# Patient Record
Sex: Female | Born: 1965 | Race: White | Hispanic: No | Marital: Married | Smoking: Never smoker
Health system: Southern US, Community
[De-identification: ages and names within clinical notes are randomized; demographics above are authoritative.]

## PROBLEM LIST (undated history)

## (undated) DIAGNOSIS — K2950 Unspecified chronic gastritis without bleeding: Secondary | ICD-10-CM

## (undated) DIAGNOSIS — T8859XA Other complications of anesthesia, initial encounter: Secondary | ICD-10-CM

## (undated) DIAGNOSIS — K219 Gastro-esophageal reflux disease without esophagitis: Secondary | ICD-10-CM

## (undated) DIAGNOSIS — K2289 Other specified disease of esophagus: Secondary | ICD-10-CM

## (undated) DIAGNOSIS — T4145XA Adverse effect of unspecified anesthetic, initial encounter: Secondary | ICD-10-CM

## (undated) DIAGNOSIS — I10 Essential (primary) hypertension: Secondary | ICD-10-CM

## (undated) DIAGNOSIS — Z9889 Other specified postprocedural states: Secondary | ICD-10-CM

## (undated) DIAGNOSIS — F329 Major depressive disorder, single episode, unspecified: Secondary | ICD-10-CM

## (undated) DIAGNOSIS — F32A Depression, unspecified: Secondary | ICD-10-CM

## (undated) DIAGNOSIS — K228 Other specified diseases of esophagus: Secondary | ICD-10-CM

## (undated) DIAGNOSIS — R112 Nausea with vomiting, unspecified: Secondary | ICD-10-CM

## (undated) HISTORY — DX: Other specified disease of esophagus: K22.89

## (undated) HISTORY — PX: TUBAL LIGATION: SHX77

## (undated) HISTORY — DX: Major depressive disorder, single episode, unspecified: F32.9

## (undated) HISTORY — PX: BREAST MASS EXCISION: SHX1267

## (undated) HISTORY — PX: WISDOM TOOTH EXTRACTION: SHX21

## (undated) HISTORY — DX: Depression, unspecified: F32.A

## (undated) HISTORY — DX: Unspecified chronic gastritis without bleeding: K29.50

## (undated) HISTORY — DX: Other specified diseases of esophagus: K22.8

---

## 1997-12-28 ENCOUNTER — Emergency Department (HOSPITAL_COMMUNITY): Admission: EM | Admit: 1997-12-28 | Discharge: 1997-12-28 | Payer: Self-pay | Admitting: Emergency Medicine

## 1998-06-12 ENCOUNTER — Inpatient Hospital Stay (HOSPITAL_COMMUNITY): Admission: EM | Admit: 1998-06-12 | Discharge: 1998-06-14 | Payer: Self-pay

## 1999-01-18 ENCOUNTER — Inpatient Hospital Stay (HOSPITAL_COMMUNITY): Admission: EM | Admit: 1999-01-18 | Discharge: 1999-01-21 | Payer: Self-pay | Admitting: *Deleted

## 2002-06-04 ENCOUNTER — Ambulatory Visit (HOSPITAL_COMMUNITY): Admission: RE | Admit: 2002-06-04 | Discharge: 2002-06-04 | Payer: Self-pay | Admitting: Obstetrics and Gynecology

## 2002-06-04 ENCOUNTER — Encounter: Payer: Self-pay | Admitting: Obstetrics and Gynecology

## 2002-06-24 ENCOUNTER — Ambulatory Visit (HOSPITAL_COMMUNITY): Admission: RE | Admit: 2002-06-24 | Discharge: 2002-06-24 | Payer: Self-pay | Admitting: Internal Medicine

## 2004-07-21 ENCOUNTER — Ambulatory Visit (HOSPITAL_COMMUNITY): Admission: RE | Admit: 2004-07-21 | Discharge: 2004-07-21 | Payer: Self-pay | Admitting: Obstetrics and Gynecology

## 2005-08-31 HISTORY — PX: ESOPHAGEAL DILATION: SHX303

## 2005-09-13 ENCOUNTER — Ambulatory Visit: Payer: Self-pay | Admitting: Gastroenterology

## 2005-09-16 ENCOUNTER — Encounter (INDEPENDENT_AMBULATORY_CARE_PROVIDER_SITE_OTHER): Payer: Self-pay | Admitting: Specialist

## 2005-09-16 ENCOUNTER — Ambulatory Visit: Payer: Self-pay | Admitting: Gastroenterology

## 2005-09-16 ENCOUNTER — Ambulatory Visit (HOSPITAL_COMMUNITY): Admission: RE | Admit: 2005-09-16 | Discharge: 2005-09-16 | Payer: Self-pay | Admitting: Gastroenterology

## 2006-10-30 ENCOUNTER — Other Ambulatory Visit: Admission: RE | Admit: 2006-10-30 | Discharge: 2006-10-30 | Payer: Self-pay | Admitting: Obstetrics and Gynecology

## 2007-02-23 ENCOUNTER — Ambulatory Visit: Payer: Self-pay | Admitting: Gastroenterology

## 2007-03-05 ENCOUNTER — Ambulatory Visit (HOSPITAL_COMMUNITY): Admission: RE | Admit: 2007-03-05 | Discharge: 2007-03-05 | Payer: Self-pay | Admitting: Gastroenterology

## 2007-03-05 ENCOUNTER — Ambulatory Visit: Payer: Self-pay | Admitting: Gastroenterology

## 2007-03-05 ENCOUNTER — Encounter: Payer: Self-pay | Admitting: Gastroenterology

## 2007-03-19 ENCOUNTER — Ambulatory Visit: Payer: Self-pay | Admitting: Gastroenterology

## 2007-03-19 ENCOUNTER — Ambulatory Visit (HOSPITAL_COMMUNITY): Admission: RE | Admit: 2007-03-19 | Discharge: 2007-03-19 | Payer: Self-pay | Admitting: Gastroenterology

## 2007-05-22 ENCOUNTER — Ambulatory Visit (HOSPITAL_COMMUNITY): Admission: RE | Admit: 2007-05-22 | Discharge: 2007-05-22 | Payer: Self-pay | Admitting: Obstetrics and Gynecology

## 2007-09-11 DIAGNOSIS — Z87898 Personal history of other specified conditions: Secondary | ICD-10-CM | POA: Insufficient documentation

## 2007-09-11 DIAGNOSIS — K219 Gastro-esophageal reflux disease without esophagitis: Secondary | ICD-10-CM | POA: Insufficient documentation

## 2007-09-11 DIAGNOSIS — K2960 Other gastritis without bleeding: Secondary | ICD-10-CM | POA: Insufficient documentation

## 2007-09-11 DIAGNOSIS — K2980 Duodenitis without bleeding: Secondary | ICD-10-CM | POA: Insufficient documentation

## 2007-09-11 DIAGNOSIS — R1110 Vomiting, unspecified: Secondary | ICD-10-CM | POA: Insufficient documentation

## 2007-09-11 DIAGNOSIS — R1319 Other dysphagia: Secondary | ICD-10-CM | POA: Insufficient documentation

## 2007-12-06 ENCOUNTER — Other Ambulatory Visit: Admission: RE | Admit: 2007-12-06 | Discharge: 2007-12-06 | Payer: Self-pay | Admitting: Obstetrics & Gynecology

## 2009-01-05 ENCOUNTER — Ambulatory Visit (HOSPITAL_COMMUNITY): Admission: RE | Admit: 2009-01-05 | Discharge: 2009-01-05 | Payer: Self-pay | Admitting: Obstetrics & Gynecology

## 2009-01-08 ENCOUNTER — Other Ambulatory Visit: Admission: RE | Admit: 2009-01-08 | Discharge: 2009-01-08 | Payer: Self-pay | Admitting: Obstetrics & Gynecology

## 2009-07-24 ENCOUNTER — Encounter: Payer: Self-pay | Admitting: Gastroenterology

## 2009-07-28 ENCOUNTER — Ambulatory Visit: Payer: Self-pay | Admitting: Gastroenterology

## 2009-07-28 ENCOUNTER — Telehealth: Payer: Self-pay | Admitting: Gastroenterology

## 2009-07-28 ENCOUNTER — Ambulatory Visit (HOSPITAL_COMMUNITY): Admission: RE | Admit: 2009-07-28 | Discharge: 2009-07-28 | Payer: Self-pay | Admitting: Gastroenterology

## 2010-01-05 ENCOUNTER — Ambulatory Visit: Payer: Self-pay | Admitting: Cardiology

## 2010-01-05 DIAGNOSIS — R079 Chest pain, unspecified: Secondary | ICD-10-CM | POA: Insufficient documentation

## 2010-01-05 DIAGNOSIS — R9431 Abnormal electrocardiogram [ECG] [EKG]: Secondary | ICD-10-CM | POA: Insufficient documentation

## 2010-01-06 ENCOUNTER — Encounter: Payer: Self-pay | Admitting: Cardiology

## 2010-01-08 ENCOUNTER — Ambulatory Visit (HOSPITAL_COMMUNITY)
Admission: RE | Admit: 2010-01-08 | Discharge: 2010-01-08 | Payer: Self-pay | Source: Home / Self Care | Attending: Cardiology | Admitting: Cardiology

## 2010-01-08 ENCOUNTER — Encounter: Payer: Self-pay | Admitting: Cardiology

## 2010-01-11 ENCOUNTER — Ambulatory Visit (HOSPITAL_COMMUNITY)
Admission: RE | Admit: 2010-01-11 | Discharge: 2010-01-11 | Payer: Self-pay | Source: Home / Self Care | Attending: Obstetrics and Gynecology | Admitting: Obstetrics and Gynecology

## 2010-01-27 ENCOUNTER — Telehealth (INDEPENDENT_AMBULATORY_CARE_PROVIDER_SITE_OTHER): Payer: Self-pay

## 2010-02-12 ENCOUNTER — Other Ambulatory Visit
Admission: RE | Admit: 2010-02-12 | Discharge: 2010-02-12 | Payer: Self-pay | Source: Home / Self Care | Admitting: Obstetrics & Gynecology

## 2010-02-21 ENCOUNTER — Encounter: Payer: Self-pay | Admitting: Obstetrics and Gynecology

## 2010-03-02 NOTE — Assessment & Plan Note (Signed)
Summary: np6 chest discomfort and under brest pain   Visit Type:  Initial Consult Referring Provider:  Dr Benard Rink Primary Provider:  Dr.Golding  CC:  chest discomfort started yesterday.  History of Present Illness: [name] is referred in today for chest discomfort.  Yesterday morning, she awoke and started having a pinpoint sensation under her left breast. It went around to the middle of her back to her mid spine. She does not recall any injury or strain. She denies any fever, chills, arthralgias. It lasted all day and then start again this afternoon. She is having low-grade pain in the office.  She denies any rashes or evidence of shingles. She denies any difficulty taking a deep breath, pleuritic pain, hemoptysis.  She's had no further complaints except some dyspnea on exertion when she rarely exercises.  Her blood pressures been borderline and has been checked recently. He was initially in the office but rechecked it it was okay. She is very quick to say that she uses salt. She is overweight. She rarely exercises.  She has no other cardiac risk factors when carefully questioned.  She has a history of esophagitis and esophageal dilatation. She remains on Prevacid. The symptoms are not consistent with her reflux.  Current Medications (verified): 1)  Prevacid 30 Mg Cpdr (Lansoprazole) .Marland Kitchen.. 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  Alcohol Use - no Regular Exercise - no Drug Use - no  Risk Factors: Exercise: no (01/05/2010)  Risk Factors: Smoking Status: never  (01/05/2010)  Review of Systems       negative other than history of present illness  Vital Signs:  Patient profile:   45 year old female Height:      63 inches Weight:      194 pounds BMI:     34.49 O2 Sat:      97 % on Room air Pulse rate:   94 / minute BP sitting:   132 / 82  (right arm)  Vitals Entered By: Dreama Saa, CNA (January 05, 2010 2:35 PM)  O2 Flow:  Room air  Physical Exam  General:  obese.  no acute distressobese.   Head:  normocephalic and atraumatic Eyes:  PERRLA/EOM intact; conjunctiva and lids normal. Neck:  Neck supple, no JVD. No masses, thyromegaly or abnormal cervical nodes. Chest Wall:  no obvious tenderness, no rash Lungs:  Clear bilaterally to auscultation and percussion. Heart:  PMI nondisplaced, soft S1-S2 is split. No murmur no rub, no bruits Abdomen:  soft, good bowel sounds, no obvious tenderness Msk:  Back normal, normal gait. Muscle strength and tone normal. Pulses:  pulses normal in all 4 extremities Extremities:  No clubbing or cyanosis. Neurologic:  Alert and oriented x 3. Skin:  Intact without lesions or rashes. Psych:  Normal affect.   Problems:  Medical Problems Added: 1)  Dx of Overweight/obesity  (ICD-278.02) 2)  Dx of Electrocardiogram, Abnormal  (ICD-794.31) 3)  Dx of Chest Pain Unspecified  (ICD-786.50)  EKG  Procedure date:  01/05/2010  Findings:      normal sinus rhythm, RSR prime in V1 and V2, nonspecific ST segment changes  Impression & Recommendations:  Problem # 1:  ELECTROCARDIOGRAM, ABNORMAL (ICD-794.31) Assessment New Will obtained echocardiogram to assess LV size, function, and question of LVH with her borderline hypertension. If she has LVH we will prescribe antihypertensive therapy. Orders: 2-D Echocardiogram (2D Echo)  Problem # 2:  CHEST PAIN UNSPECIFIED (ICD-786.50) This is clearly noncardiac. Reassurance given. I'm concerned she may have potential shingles. Orders: 2-D Echocardiogram (2D  Echo)  Problem # 3:  ESOPHAGEAL STRICTURE (ICD-530.3) Assessment: Unchanged  Problem # 4:  ESOPHAGEAL REFLUX (ICD-530.81) Assessment: Unchanged  The following medications were removed from the medication list:    Kapidex 60 Mg Cpdr (Dexlansoprazole) .Marland Kitchen... Take 1 capsule by mouth once a day Her updated medication list for this problem includes:    Prevacid 30 Mg Cpdr (Lansoprazole) .Marland Kitchen... 1 by mouth every morning  Problem # 5:  OVERWEIGHT/OBESITY (ICD-278.02) encourage her to lose weight.  Other Orders: Durable Medical Equipment (DME)  Patient Instructions: 1)  Your physician recommends that you schedule a follow-up appointment in: as needed  2)  Your physician recommends that you continue on your current medications as directed. Please refer to the Current Medication list given to you today. 3)  Your physician encouraged you to lose weight for better health. 4)  Please walk at least 3 hours per week 5)  Your physician has requested that you have an echocardiogram.  Echocardiography is a painless test that uses sound waves to create images of your heart. It provides your doctor with information about the size and shape of your heart and how well your heart's chambers and valves are working.  This procedure takes approximately one hour. There are no restrictions for this procedure.

## 2010-03-02 NOTE — Letter (Signed)
Summary: egd order  egd order   Imported By: Hendricks Limes LPN 16/11/9602 54:09:81  _____________________________________________________________________  External Attachment:    Type:   Image     Comment:   External Document

## 2010-03-02 NOTE — Progress Notes (Signed)
Summary: Prevacid  Rx Prevacid. Pt will call if co-pay too expensive and will change to omeprazole. West Bali MD  July 28, 2009 10:33 AM      New/Updated Medications: PREVACID 30 MG CPDR (LANSOPRAZOLE) 1 by mouth every morning Prescriptions: PREVACID 30 MG CPDR (LANSOPRAZOLE) 1 by mouth every morning  #30 x 11   Entered and Authorized by:   West Bali MD   Signed by:   West Bali MD on 07/28/2009   Method used:   Electronically to        [facility]* (retail)       [address]       [address] Shipping/mailing       [place], [place]  [address]       Ph: [phone]       Fax: [phone]   RxID:   [id number]

## 2010-03-04 NOTE — Progress Notes (Signed)
Summary: diarrhea/vomiting  Phone Note Call from Patient Call back at Home Phone [phone]   Caller: Patient Summary of Call: pt called- having watery diarrhea and vomiting. requesting rx for phenergan called to [facility].  Initial call taken by: Hendricks Limes LPN,  January 27, 2010 1:30 PM     Appended Document: diarrhea/vomiting pt may have phenergan 25 mg tabs, take 1 by mouth q 6-8 hours as needed nausea. Disp# 30. INstruct start off with half a tablet to see how does. warn about risk of drowsiness.   Appended Document: diarrhea/vomiting pt aware, rx called to [facility]

## 2010-06-15 NOTE — Op Note (Signed)
[name], COLGATE               ACCOUNT NO.:  [id number]   MEDICAL RECORD NO.:  [id number]          PATIENT TYPE:  AMB   LOCATION:  DAY                           FACILITY:  [facility]   PHYSICIAN:  Kassie Mends, M.D.      DATE OF BIRTH:  1965/11/18   DATE OF PROCEDURE:  03/04/2007  DATE OF DISCHARGE:                               OPERATIVE REPORT   REFERRING PHYSICIAN:  Corrie Mckusick, M.D.   PROCEDURE:  Esophagogastroduodenoscopy with cold forceps biopsy and  Savary dilation to 14 mm.   INDICATION FOR EXAM:  Ms. [name] is a 45 year old female who was last  seen in 2007 for reflux disease.  She subsequently stopped her Prevacid.  She had antral biopsies which showed mild chronic gastritis.  She  presents with progressive solid dysphasia.  She was previously dilated  in August 2007.   FINDINGS:  1. Distal esophageal stricture which narrowed the esophageal lumen to      approximately 12 mm.  The distal esophagus was dilated successively      using the Savary dilators from 12 to 14 mm.  Otherwise no masses,      erosion or ulcerations seen in the distal esophagus.  2. Mild patchy erythema in the antrum without erosion or ulceration.      Biopsies obtained via cold forceps to evaluate for H. pylori      gastritis.  3. Moderate erythema with occasional erosions seen in the duodenal      bulb and the junction of D1 and D2.  Normal second portion of the      duodenum.   DIAGNOSES:  1. Dysphagia likely secondary to distal esophageal stricture.  2. Mild gastritis and duodenitis   RECOMMENDATIONS:  1. She should continue Prevacid indefinitely.  2. She should avoid aspirin and NSAIDs for 30 days.  No      anticoagulation for 7-14 days.  3. She should return in 1-2 weeks to have additional dilation.  4. May resume soft diet.  Meat should be chopped or ground meat and no      raw vegetables.  She was given a handout on gastritis.   MEDICATIONS:  1. Demerol 100 mg IV.  2. Versed 6 mg  IV.   PROCEDURE TECHNIQUE:  Physical exam was performed.  Informed consent was  obtained from the patient after explaining the benefits, risks and  alternatives to the procedure.  The patient was connected to the monitor  and placed in left lateral position.  Continuous oxygen was provided by  nasal cannula and IV medicine administered through an indwelling  cannula.  After administration of sedation, the patient's esophagus was  intubated and the scope was advanced under direct visualization to the  second portion of the duodenum.  The scope was removed slowly by  carefully examining the color, texture, anatomy and integrity of the  mucosa on the way out.  Cold forceps biopsies were obtained.  The Savary  guidewire was introduced and the scope was withdrawn.  Each dilator was  introduced over the guidewire.  The guidewire and the Savary  dilator  were removed after dilating the esophagus up to 14 mm.  The patient was  recovered in endoscopy and discharged home in satisfactory condition.      Kassie Mends, M.D.  Electronically Signed     SM/MEDQ  D:  03/05/2007  T:  03/05/2007  Job:  [id number]   cc:   Corrie Mckusick, M.D.  Fax: [phone]

## 2010-06-15 NOTE — Consult Note (Signed)
NAMECAELEY, DOHRMANN               ACCOUNT NO.:  [id number]   MEDICAL RECORD NO.:  [id number]          PATIENT TYPE:  AMB   LOCATION:  DAY                           FACILITY:  [facility]   PHYSICIAN:  Kassie Mends, M.D.      DATE OF BIRTH:  03/31/1965   DATE OF CONSULTATION:  02/23/2007  DATE OF DISCHARGE:                                 CONSULTATION   REFERRING PHYSICIAN:  Dr. Corrie Mckusick.   PROBLEM LIST:  1. Dysphagia.  2. History of mild chronic gastritis.  3. Esophageal dilation in August of 2007.  4. Duodenitis in August of 2007.  5. Tubal ligation   SUBJECTIVE:  Ms. Paulding is a 45 year old female who has had worsening  of her difficulty swallowing over the last 6 months.  Eating always  brings on the symptoms.  It does not matter whether it is scrambled eggs  or pork chops.  It has definitely been happening more frequently since  2005 or 2007.  Her worst episode was last Saturday.  She started eating  and had severe chest tightness.  She thought that she was going to have  to go to the emergency department.  It eventually resolved.  Her  symptoms sometimes get better been a hiccup, or a burp.  She has been  off the Prevacid for 1 year.  She says her heartburn comes and goes.  She denies any nausea, vomiting, or weight loss.   PAST MEDICAL HISTORY:  Headache.   PAST SURGICAL HISTORY:  1. Tubal ligation.  2. Breast biopsy.   SOCIAL HISTORY:  She is married and works in the clinic.  She does not  smoke.  She rarely consumes alcohol.   PHYSICAL EXAM:  VITAL SIGNS:  Weight 190 pounds (down 5 pounds since  August of 2007), height 5 feet 2 inches, temperature 97.7, blood  pressure 120/90, pulse 76.  GENERAL:  She is in no apparent distress, alert and orient x4.  LUNGS:  Clear to auscultation bilaterally.  CARDIOVASCULAR:  A regular rhythm, no murmur.  ABDOMEN:  Bowel sounds are present, soft, nontender, nondistended.   ASSESSMENT:  Ms. Stegner is a 45 year old female who  has dysphagia most  likely secondary to a distal esophageal stricture.  She could have  esophageal motility disorder secondary to uncontrolled reflux disease.  Thank you for allowing me to see Ms. Barcia in consultation.  My  recommendations follow.   RECOMMENDATIONS:  1. Begin Prevacid 30 mg daily.  2. Will schedule for an EGD with dilation.  3. She has a follow up appointment to see me in 2 months.      Kassie Mends, M.D.  Electronically Signed     SM/MEDQ  D:  02/23/2007  T:  02/23/2007  Job:  [id number]   cc:   Corrie Mckusick, M.D.  Fax: [phone]

## 2010-06-15 NOTE — Op Note (Signed)
[name], [name]               ACCOUNT NO.:  [id number]   MEDICAL RECORD NO.:  [id number]          PATIENT TYPE:  AMB   LOCATION:  DAY                           FACILITY:  [facility]   PHYSICIAN:  Kassie Mends, M.D.      DATE OF BIRTH:  [birth year]   DATE OF PROCEDURE:  03/19/2007  DATE OF DISCHARGE:                               OPERATIVE REPORT   REFERRING PHYSICIAN:  Corrie Mckusick, M.D.   PROCEDURE:  Esophagogastroduodenoscopy with Savary dilation to 16 mm.   INDICATION FOR EXAM:  Ms. Large is a 45 year old female who continues  to have solid dysphagia.  Her recent episode was within the last week.  She was dilated to 14 mm earlier in February.   FINDINGS:  1. Distal esophageal stricture.  This stricture was dilated      successively using the Savary dilator from 12.8 mm to 16 mm.      Otherwise no masses, inflammatory changes or erosions seen in the      esophagus.  2. Patchy erythema in the antrum.  3. Patchy erythema at the junction of D1 and D2.   DIAGNOSIS:  Dysphagia secondary to distal esophageal stricture requiring  esophageal dilation.   RECOMMENDATIONS:  1. Hold aspirin and anti-inflammatory drugs until I assess the need      for subsequent dilation.  2. She should continue her previous diet and consume ground or chopped      meats only, no raw vegetables.  3. She has a follow-up appointment see me in 3 weeks for dysphagia.  4. She should continue the Prevacid indefinitely.   MEDICATIONS:  1. Demerol 75 mg IV.  2. Versed 6 mg IV.   PROCEDURE TECHNIQUE:  Physical exam was formed.  Informed consent was  obtained from the patient explaining the benefits, risks and  alternatives to the procedure.  The patient was connected to the monitor  and placed in left lateral position.  Continuous oxygen was provided by  nasal cannula and IV medicine administered through an indwelling  cannula.  After administration of sedation, the patient's esophagus was  intubated and  the scope was advanced under direct visualization to the  second portion of the duodenum.  The scope was withdrawn.  The Savary  guidewire was introduced into the antrum.  The scope was removed slowly  by carefully examining the color,  texture, anatomy and integrity of the mucosa on the way out.  The GE  junction was noted at 30 cm from the teeth.  Each Savary dilator was  introduced over the guidewire.  The 16-mm guidewire and dilator were  removed.  The patient was recovered in the endoscopy and discharged home  in satisfactory condition.      Kassie Mends, M.D.  Electronically Signed     SM/MEDQ  D:  03/19/2007  T:  03/20/2007  Job:  [id number]   cc:   Corrie Mckusick, M.D.  Fax: [phone]

## 2010-06-18 NOTE — Op Note (Signed)
NAME:  [name], [name]                           ACCOUNT NO.:  [id number]   MEDICAL RECORD NO.:  [id number]                   PATIENT TYPE:  AMB   LOCATION:  DAY                                  FACILITY:  [facility]   PHYSICIAN:  Bernerd Limbo. Leona Carry, M.D.             DATE OF BIRTH:  1965-03-11   DATE OF PROCEDURE:  06/24/2002  DATE OF DISCHARGE:                                 OPERATIVE REPORT   PREOPERATIVE DIAGNOSES:  1. Fibroadenoma of the left breast.  2. Desire for sterilization.   POSTOPERATIVE DIAGNOSES:  1. Fibroadenoma of the left breast.  2. Desire for sterilization.   OPERATION PERFORMED:  Left partial mastectomy, laparoscopic bilateral tubal  ligation.   SURGEON:  Bernerd Limbo. Leona Carry, M.D.   COMPLICATIONS:  None.   OPERATIVE PROCEDURE:  Under adequate general anesthesia, the patient was  prepped and draped in the usual manner.   An elliptical incision was made in the upper outer quadrant of the left  breast.  This was carried through the subcutaneous tissues, and a large  lipoma that measured about 4 x 5 x 5 cm was removed.  Bleeders were  electrofulgurated.  The breast tissue was reapproximated with interrupted 2-  0 Vicryl.  The subcutaneous tissue was closed with continuous 3-0 Vicryl.  The skin was closed with subcuticular suture of 3-0 Vicryl.  Steri-Strips  and OpSite dressing applied.   An incision was then made below the umbilicus and a Veress needle inserted  into the peritoneal cavity.  The abdomen was insufflated up to 15 mmHg  pressure with 3-4 L of carbon dioxide.  The needle was removed, and the  trocar with the Visiport attached was inserted into the peritoneal cavity.  It should be mentioned that prior to surgery, a uterine manipulator had been  placed in the vagina.  A small incision was made in the right lower  quadrant, and through this, an 11-mm trocar was inserted.  A small incision  was made in the left lower quadrant, and a 5-mm trocar was  inserted.  The  patient was then placed in Trendelenburg.  The uterus was mobilized with the  uterine manipulator.  The left tube and ovary were identified.  The isthmus  was identified of the tube.  It was doubly clipped proximally and distally  and then transected utilizing the harmonic scalpel.  A similar procedure was  carried out on the right side.  At the completion of the procedure, all  bleeding was under control.  There was a small 2 to 3-mm cyst on the left  ovary.  The instruments were removed under direct vision.  The fascial  layers were closed with 2-0  Vicryl.  The skin was closed with skin clips.  Steri-Strips and OpSite  dressing applied.  The Foley catheter and uterine manipulator were removed.   The patient tolerated the procedure nicely and the left the room  in good  condition.                                               Bernerd Limbo. Leona Carry, M.D.    NMD/MEDQ  D:  06/24/2002  T:  06/24/2002  Job:  [id number]

## 2010-06-18 NOTE — Consult Note (Signed)
[name], [name]                  ACCOUNT NO.:  [id number]   MEDICAL RECORD NO.:  [id number]          PATIENT TYPE:  AMB   LOCATION:  DAY                           FACILITY:  [facility]   PHYSICIAN:  Kassie Mends, M.D.      DATE OF BIRTH:  [birth year]   DATE OF CONSULTATION:  09/13/2005  DATE OF DISCHARGE:                                   CONSULTATION   REASON FOR VISIT:  Dysphagia.   DATE OF VISIT:  September 13, 2005.   PRIMARY CARE PHYSICIAN:  Dr. Phillips Odor.   HISTORY OF PRESENT ILLNESS:  Ms. [name] is a 45 year old female who has been  having episodic dysphagia off and on or the last year.  It does not happen  more often than once every week or every other week.  When she eats solids,  she get a tightness in her chest, needs to stand up and throw her shoulders  back.  Eventually, it either relieves itself, or she has to vomit.  She  feels fine after the tightness resolves.  She admits to acid reflux at  nighttime less than once a week.  She denies any weight loss.  She has had a  30-pound weight gain.  At night she may have this nasty tasting stuff in  her mouth.  She treats her nighttime acid symptoms with Tums.  She has never  tried any other medicines.  She denies any blood in the vomit or stool.  She  does not have any liquid dysphagia.  She has not identified any specific  triggers for the tightness in her chest but does know which foods my trigger  an episode of reflux.  She denies any nausea or vomiting outside these  episodes. She denies any black tarry stools.   PAST MEDICAL HISTORY:  None.  She has had tumors removed from both breasts,  a tubal ligation, and wisdom teeth extracted.   She is not allergic to any medicines.  She currently takes Tylenol Cold and  Cough as needed.  She has no family history of colon polyps.  She has a  paternal uncle, aunt, and cousin who had colon cancer.  She denies any  history of esophageal motility disorders.  She has two children, ages 67  and  64.  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  She denies any  tobacco use and rarely drinks alcohol.   REVIEW OF SYSTEMS:  Per the HPI, otherwise all systems are negative.   PHYSICAL EXAMINATION:  VITAL SIGNS:  Weight 195 pounds, height 5 feet 3  inches, temperature 99.5, blood pressure 130/90, pulse 80.  GENERAL: She is in no apparent distress, alert and oriented x4.HEENT:  Atraumatic and normocephalic.  Pupils equal, round, and reactive to light.  Mouth: No oral lesions.  Posterior pharynx without erythema or exudate.NECK:  Full range of motion and no lymphadenopathy.LUNGS: Clear to auscultation  bilaterally.CARDIOVASCULAR:  Regular rate and rhythm.  No murmur.  Normal S1  and S2. ABDOMEN: Bowel sounds present.  Soft, nontender, nondistended.  No  rebound or guarding.  Obese, no hepatosplenomegaly.EXTREMITIES:  Without  cyanosis, clubbing, or edema.  NEUROLOGIC:  No focal deficits.   Ms. [name] is a 45 year old female with solid dysphagia.  She likely has a  peptic stricture from uncontrolled reflux disease.  The differential  diagnoses include eosinophilic esophagitis or esophageal motility disorder.   Ms. [name] should have an upper endoscopy as soon as possible.  She is  instructed to strictly adhere to a soft and mechanical soft diet.  She may  follow up as needed after her procedure is performed.      Kassie Mends, M.D.  Electronically Signed     SM/MEDQ  D:  09/13/2005  T:  09/13/2005  Job:  [id number]   cc:   Corrie Mckusick, M.D.  Fax: [phone]

## 2010-06-18 NOTE — Op Note (Signed)
[name], [name]                  ACCOUNT NO.:  [id number]   MEDICAL RECORD NO.:  [id number]          PATIENT TYPE:  AMB   LOCATION:  DAY                           FACILITY:  [facility]   PHYSICIAN:  Kassie Mends, M.D.      DATE OF BIRTH:  1965/02/16   DATE OF PROCEDURE:  09/16/2005  DATE OF DISCHARGE:                                 OPERATIVE REPORT   PROCEDURE:  Esophagogastroduodenoscopy with Savary dilation and cold forceps  biopsy.   INDICATION FOR EXAMINATION:  Ms. [name] is a 45 year old female with solid  dysphagia and complaining of heartburn.   MEDICATIONS:  1. Demerol 100 mg IV.  2. Versed 4 mg IV.   FINDINGS:  1. Distal esophageal stricture likely secondary to uncontrolled reflux      disease.  This is likely source for her solid dysphagia.  2. Mild antral gastritis.  Biopsies obtained for H pylori.  3. Mild duodenitis.  4. Otherwise esophagus without evidence of inflammation, mass or Barrett's      esophagus.   RECOMMENDATIONS:  1. Prevacid twice daily for 8 weeks then daily.  She will follow-up in 3      months.  2. No aspirin, NSAIDs or anticoagulation for 7 days.   PROCEDURE TECHNIQUE:  A physical exam was performed and informed consent was  obtained from the patient after explaining the benefits, risks and  alternatives to the procedure.  The patient was connected to the monitor and  placed in the left lateral position.  Continuous oxygen was provided by  nasal cannula and IV medicine administered via an indwelling cannula.  After  sedation, the patient's esophagus was intubated and the scope was advanced  under  direct visualization to the second portion of the duodenum.  The scope was  slowly withdrawn while closely examining the mucosal anatomy, color, texture  and integrity on the way out.  The patient was recovered in the endoscopy  suite and discharged home in satisfactory condition.      Kassie Mends, M.D.  Electronically Signed     SM/MEDQ  D:   09/16/2005  T:  09/16/2005  Job:  [id number]   cc:   Corrie Mckusick, M.D.  Fax: [phone]

## 2010-06-18 NOTE — H&P (Signed)
NAME:  [name], Schuchard                           ACCOUNT NO.:  [id number]   MEDICAL RECORD NO.:  [id number]                   PATIENT TYPE:  AMB   LOCATION:  DAY                                  FACILITY:  [facility]   PHYSICIAN:  Bernerd Limbo. Leona Carry, M.D.             DATE OF BIRTH:  09-14-1965   DATE OF ADMISSION:  DATE OF DISCHARGE:                                HISTORY & PHYSICAL   This 45 year old female was admitted to this hospital for right partial  mastectomy.   HISTORY OF PRESENT ILLNESS:  The patient recently had a mammogram that  revealed bilateral fibroadenomas of both wrists, most marked on the left.  The fibroadenoma on the left measured 3 to 4 cm in diameter. There has been  no evidence of any nipple discharge. She has had some soreness related to  this.   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 This was also for fibroadenoma. She is a gravida 2,  para 2, AB 0. She has no history of any food or drug allergy.   As mentioned, she did have some soreness of both breasts before her periods,  most marked on the left. Periods have been irregular apparently had been  told that she probably had some polycystic ovaries. She is not taking any  medications.   PHYSICAL EXAMINATION:  GENERAL:  Reveals a healthy, well-developed, well-  nourished, 45 year old white female in no acute distress.  VITAL SIGNS:  Her blood pressure is 130/74, pulse 80, respirations 20.  HEAD, EYES, EARS, NOSE AND THROAT:  Were normal. No jaundice.  NECK:  Supple. Thyroid not enlarged.  CARDIOVASCULAR:  Regular sinus rhythm with no rubs or  murmurs.  RESPIRATORY:  Chest is clear to percussion and auscultation.  BREASTS:  Moderate size. In the left upper quadrant of the left breast,  there is a large 3 to 4 cm mass that is slightly tender to touch, is smooth  and firm. Left axilla is normal. Right breast essentially normal. I really  do not feel any  discrete masses. Right axilla is normal. The right breast  has a well-healed upper scar around the areolar of the right breast.  ABDOMEN:  Soft. No areas of muscle guarding or tenderness. No visceromegaly.  No operative scars.  LIMBS AND BACK:  Negative.  PELVIC EXAM:  Deferred.   ADMISSION DIAGNOSIS:  Fibroadenoma of the left breast.   The patient also has requested a tubal ligation. She is a gravida 2, para 2,  AB 0. I discussed this with her also. The surgery, risks and complications,  and possible consequences have been discussed with her. She agrees to the  surgery. She has been scheduled for a left breast biopsy and laparoscopic  tubal ligation for the 24th of May.  Bernerd Limbo. Leona Carry, M.D.   NMD/MEDQ  D:  06/21/2002  T:  06/21/2002  Job:  [id number]

## 2010-12-15 ENCOUNTER — Telehealth: Payer: Self-pay | Admitting: Gastroenterology

## 2010-12-15 MED ORDER — LEVOFLOXACIN 500 MG PO TABS: 500.0000 mg | ORAL_TABLET | Freq: Every day | ORAL | Status: AC

## 2010-12-15 MED ORDER — BENZONATATE 100 MG PO CAPS: ORAL_CAPSULE | ORAL | Status: DC

## 2010-12-15 NOTE — Telephone Encounter (Signed)
Patient with chronic cough, 6 wks. Lisinopril switched to metoprolol. No improvement on Zpak, medrol shots. Persistent nonproductive cough. Also on zoloft, prevacid.  Try Tessalon, levaquin.

## 2010-12-16 NOTE — Telephone Encounter (Signed)
Lungs CTAB.

## 2010-12-28 ENCOUNTER — Other Ambulatory Visit: Payer: Self-pay | Admitting: Obstetrics & Gynecology

## 2010-12-28 DIAGNOSIS — Z139 Encounter for screening, unspecified: Secondary | ICD-10-CM

## 2011-01-14 ENCOUNTER — Ambulatory Visit (HOSPITAL_COMMUNITY)
Admission: RE | Admit: 2011-01-14 | Discharge: 2011-01-14 | Disposition: A | Discharge status: Home / Self Care | Payer: 59 | Source: Ambulatory Visit | Attending: Obstetrics & Gynecology | Admitting: Obstetrics & Gynecology

## 2011-01-14 ENCOUNTER — Ambulatory Visit (HOSPITAL_COMMUNITY): Payer: 59

## 2011-01-14 DIAGNOSIS — Z139 Encounter for screening, unspecified: Secondary | ICD-10-CM

## 2011-01-14 DIAGNOSIS — Z1231 Encounter for screening mammogram for malignant neoplasm of breast: Secondary | ICD-10-CM | POA: Insufficient documentation

## 2011-01-20 ENCOUNTER — Other Ambulatory Visit: Payer: Self-pay | Admitting: Obstetrics & Gynecology

## 2011-01-20 DIAGNOSIS — R928 Other abnormal and inconclusive findings on diagnostic imaging of breast: Secondary | ICD-10-CM

## 2011-01-27 ENCOUNTER — Encounter: Payer: Self-pay | Admitting: Cardiology

## 2011-02-08 ENCOUNTER — Other Ambulatory Visit: Payer: Self-pay

## 2011-02-08 MED ORDER — LANSOPRAZOLE 30 MG PO CPDR: 30.0000 mg | DELAYED_RELEASE_CAPSULE | Freq: Every day | ORAL | Status: DC

## 2011-02-15 ENCOUNTER — Other Ambulatory Visit: Payer: Self-pay | Admitting: Obstetrics & Gynecology

## 2011-02-15 ENCOUNTER — Other Ambulatory Visit (HOSPITAL_COMMUNITY)
Admission: RE | Admit: 2011-02-15 | Discharge: 2011-02-15 | Disposition: A | Discharge status: Home / Self Care | Payer: 59 | Source: Ambulatory Visit | Attending: Obstetrics & Gynecology | Admitting: Obstetrics & Gynecology

## 2011-02-15 DIAGNOSIS — Z01419 Encounter for gynecological examination (general) (routine) without abnormal findings: Secondary | ICD-10-CM | POA: Insufficient documentation

## 2011-02-16 ENCOUNTER — Ambulatory Visit (HOSPITAL_COMMUNITY)
Admission: RE | Admit: 2011-02-16 | Discharge: 2011-02-16 | Disposition: A | Discharge status: Home / Self Care | Payer: 59 | Source: Ambulatory Visit | Attending: Obstetrics & Gynecology | Admitting: Obstetrics & Gynecology

## 2011-02-16 ENCOUNTER — Other Ambulatory Visit: Payer: Self-pay | Admitting: Obstetrics & Gynecology

## 2011-02-16 DIAGNOSIS — D249 Benign neoplasm of unspecified breast: Secondary | ICD-10-CM | POA: Insufficient documentation

## 2011-02-16 DIAGNOSIS — N63 Unspecified lump in unspecified breast: Secondary | ICD-10-CM | POA: Insufficient documentation

## 2011-02-16 DIAGNOSIS — R928 Other abnormal and inconclusive findings on diagnostic imaging of breast: Secondary | ICD-10-CM

## 2011-12-15 ENCOUNTER — Other Ambulatory Visit: Payer: Self-pay | Admitting: Obstetrics & Gynecology

## 2011-12-16 ENCOUNTER — Other Ambulatory Visit: Payer: Self-pay | Admitting: Obstetrics & Gynecology

## 2011-12-16 DIAGNOSIS — Z139 Encounter for screening, unspecified: Secondary | ICD-10-CM

## 2012-01-16 ENCOUNTER — Ambulatory Visit (HOSPITAL_COMMUNITY): Payer: 59

## 2012-01-23 ENCOUNTER — Ambulatory Visit (HOSPITAL_COMMUNITY): Payer: 59

## 2012-01-30 ENCOUNTER — Ambulatory Visit (HOSPITAL_COMMUNITY)
Admission: RE | Admit: 2012-01-30 | Discharge: 2012-01-30 | Disposition: A | Discharge status: Home / Self Care | Payer: 59 | Source: Ambulatory Visit | Attending: Obstetrics & Gynecology | Admitting: Obstetrics & Gynecology

## 2012-01-30 DIAGNOSIS — R922 Inconclusive mammogram: Secondary | ICD-10-CM | POA: Insufficient documentation

## 2012-01-30 DIAGNOSIS — Z139 Encounter for screening, unspecified: Secondary | ICD-10-CM

## 2012-02-13 ENCOUNTER — Other Ambulatory Visit: Payer: Self-pay | Admitting: Obstetrics & Gynecology

## 2012-02-13 DIAGNOSIS — R928 Other abnormal and inconclusive findings on diagnostic imaging of breast: Secondary | ICD-10-CM

## 2012-02-29 ENCOUNTER — Ambulatory Visit (HOSPITAL_COMMUNITY)
Admission: RE | Admit: 2012-02-29 | Discharge: 2012-02-29 | Disposition: A | Discharge status: Home / Self Care | Payer: 59 | Source: Ambulatory Visit | Attending: Obstetrics & Gynecology | Admitting: Obstetrics & Gynecology

## 2012-02-29 ENCOUNTER — Other Ambulatory Visit (HOSPITAL_COMMUNITY): Payer: Self-pay | Admitting: Obstetrics & Gynecology

## 2012-02-29 DIAGNOSIS — D249 Benign neoplasm of unspecified breast: Secondary | ICD-10-CM | POA: Insufficient documentation

## 2012-02-29 DIAGNOSIS — R928 Other abnormal and inconclusive findings on diagnostic imaging of breast: Secondary | ICD-10-CM

## 2012-06-01 ENCOUNTER — Telehealth: Payer: Self-pay | Admitting: Gastroenterology

## 2012-06-01 MED ORDER — BENZONATATE 100 MG PO CAPS
ORAL_CAPSULE | ORAL | Status: DC
Start: 2012-06-01 — End: 2012-06-01

## 2012-06-01 MED ORDER — BENZONATATE 100 MG PO CAPS: ORAL_CAPSULE | ORAL | Status: DC

## 2012-06-01 NOTE — Telephone Encounter (Signed)
Patient with complaints of cough related to allergies. Watery nasal discharge. Coughing up clear mucous. No SOB. Without PCP currently. Taking metoprolol, zoloft, prevacid, allergy pills, benedryl. Lungs CTA. Heart: RRR, no murmurs, rubs, gallops. Afebrile.  Trial of Tessalon 100mg , take 1-2 po tid prn cough. RX sent to [facility].  Establish care with PCP.

## 2012-07-10 ENCOUNTER — Ambulatory Visit: Payer: Self-pay | Admitting: Adult Health

## 2012-07-16 ENCOUNTER — Ambulatory Visit (INDEPENDENT_AMBULATORY_CARE_PROVIDER_SITE_OTHER): Payer: 59 | Admitting: Family Medicine

## 2012-07-16 ENCOUNTER — Encounter: Payer: Self-pay | Admitting: Family Medicine

## 2012-07-16 VITALS — BP 130/70 | HR 100 | Temp 98.5°F | Resp 20 | Ht 61.5 in | Wt 197.0 lb

## 2012-07-16 DIAGNOSIS — Z13228 Encounter for screening for other metabolic disorders: Secondary | ICD-10-CM

## 2012-07-16 DIAGNOSIS — N3946 Mixed incontinence: Secondary | ICD-10-CM

## 2012-07-16 DIAGNOSIS — R5383 Other fatigue: Secondary | ICD-10-CM | POA: Insufficient documentation

## 2012-07-16 DIAGNOSIS — Z1321 Encounter for screening for nutritional disorder: Secondary | ICD-10-CM

## 2012-07-16 DIAGNOSIS — Z130 Encounter for screening for diseases of the blood and blood-forming organs and certain disorders involving the immune mechanism: Secondary | ICD-10-CM

## 2012-07-16 DIAGNOSIS — R32 Unspecified urinary incontinence: Secondary | ICD-10-CM

## 2012-07-16 DIAGNOSIS — I10 Essential (primary) hypertension: Secondary | ICD-10-CM

## 2012-07-16 DIAGNOSIS — R5381 Other malaise: Secondary | ICD-10-CM

## 2012-07-16 DIAGNOSIS — F338 Other recurrent depressive disorders: Secondary | ICD-10-CM | POA: Insufficient documentation

## 2012-07-16 DIAGNOSIS — E669 Obesity, unspecified: Secondary | ICD-10-CM | POA: Insufficient documentation

## 2012-07-16 DIAGNOSIS — N898 Other specified noninflammatory disorders of vagina: Secondary | ICD-10-CM

## 2012-07-16 DIAGNOSIS — F39 Unspecified mood [affective] disorder: Secondary | ICD-10-CM

## 2012-07-16 DIAGNOSIS — G4700 Insomnia, unspecified: Secondary | ICD-10-CM

## 2012-07-16 DIAGNOSIS — K219 Gastro-esophageal reflux disease without esophagitis: Secondary | ICD-10-CM

## 2012-07-16 LAB — URINALYSIS, MICROSCOPIC ONLY: Casts: NONE SEEN

## 2012-07-16 LAB — URINALYSIS, ROUTINE W REFLEX MICROSCOPIC
Bilirubin Urine: NEGATIVE
Ketones, ur: NEGATIVE mg/dL
Nitrite: NEGATIVE
Specific Gravity, Urine: >1.03 — ABNORMAL HIGH (ref 1.005–1.030)
pH: 5.5 (ref 5.0–8.0)

## 2012-07-16 NOTE — Assessment & Plan Note (Signed)
Pt not truly symptomatic, await culture

## 2012-07-16 NOTE — Assessment & Plan Note (Signed)
Discussed trial of melatonin, good sleep hygiene

## 2012-07-16 NOTE — Assessment & Plan Note (Signed)
Obtain previous records, very low dose of zoloft? Uses during winter months

## 2012-07-16 NOTE — Assessment & Plan Note (Signed)
On PPI, has seen GI in past

## 2012-07-16 NOTE — Assessment & Plan Note (Signed)
Well controlled no meds

## 2012-07-16 NOTE — Assessment & Plan Note (Signed)
encouraged increase in activity and weight loss

## 2012-07-16 NOTE — Progress Notes (Signed)
  Subjective:    Patient ID: [name], female    DOB: [birth year], 47 y.o.   MRN: [id number]  HPI  Pt here to establish care, last PCP [facility]. GI- Dr. Darrick Penna, GYN- [facility] Medications and history reviewed Fatigue for past 6 months, does not exercise on regular basis, stopped her BP meds. Takes nap during the day and still tired in evening, has difficulty sleeping at night for many years. Incontinence- worsening incontinence, leaks urine with cough or sneeze, past few weeks, has a lot of urge to urinate, denies dysuria, abd pain, pelvic pain. Has GYN appt coming up Seasonal depression- uses zoloft in winter only for past 2 years  Review of Systems - per above   GEN- +fatigue, denies fever, weight loss,weakness, recent illness HEENT- denies eye drainage, change in vision, nasal discharge, CVS- denies chest pain, palpitations RESP- denies SOB, cough, wheeze ABD- denies N/V, change in stools, abd pain GU- denies dysuria, hematuria, dribbling, incontinence MSK- denies joint pain, muscle aches, injury Neuro- denies headache, dizziness, syncope, seizure activity       Objective:   Physical Exam GEN- NAD, alert and oriented x3 HEENT- PERRL, EOMI, non injected sclera, pink conjunctiva, MMM, oropharynx clear Neck- Supple, no thyromegaly CVS- RRR, no murmur RESP-CTAB ABD-NABS,soft,NT,ND, no CVA tenderness EXT- No edema Pulses- Radial, DP- 2+ Psych- normal affect and mood       Assessment & Plan:

## 2012-07-16 NOTE — Patient Instructions (Signed)
Get the labs done fasting Start multivitamin once a day, make sure there is B12, if not take B12 daily Perform the kegal exercises Follow-up with GYN F/U 6 months or as needed

## 2012-07-16 NOTE — Assessment & Plan Note (Signed)
likley multifactorial, check fasting labs, add vitamin, increase exercise

## 2012-07-16 NOTE — Assessment & Plan Note (Signed)
UC pending, she will have pelvic exam with GYN Discussed possible use of meds, she wants to hold at this time Kegal exercises

## 2012-07-18 LAB — URINE CULTURE: Colony Count: 75000

## 2012-07-21 LAB — CBC WITH DIFFERENTIAL/PLATELET
Basophils Relative: 1 % (ref 0–1)
HCT: 37.6 % (ref 36.0–46.0)
Hemoglobin: 12.9 g/dL (ref 12.0–15.0)
Lymphocytes Relative: 33 % (ref 12–46)
Lymphs Abs: 2.4 10*3/uL (ref 0.7–4.0)
MCHC: 34.3 g/dL (ref 30.0–36.0)
Monocytes Absolute: 0.5 10*3/uL (ref 0.1–1.0)
Monocytes Relative: 7 % (ref 3–12)
Neutro Abs: 4.1 10*3/uL (ref 1.7–7.7)
Neutrophils Relative %: 57 % (ref 43–77)
RBC: 4.92 MIL/uL (ref 3.87–5.11)
WBC: 7.2 10*3/uL (ref 4.0–10.5)

## 2012-07-21 LAB — COMPREHENSIVE METABOLIC PANEL
AST: 15 U/L (ref 0–37)
Albumin: 4 g/dL (ref 3.5–5.2)
BUN: 11 mg/dL (ref 6–23)
Calcium: 8.8 mg/dL (ref 8.4–10.5)
Chloride: 110 mEq/L (ref 96–112)
Creat: 0.73 mg/dL (ref 0.50–1.10)
Glucose, Bld: 108 mg/dL — ABNORMAL HIGH (ref 70–99)
Potassium: 3.8 mEq/L (ref 3.5–5.3)

## 2012-07-21 LAB — LIPID PANEL
Cholesterol: 140 mg/dL (ref 0–200)
LDL Cholesterol: 89 mg/dL (ref 0–99)
Triglycerides: 75 mg/dL (ref <150)

## 2012-07-21 LAB — TSH: TSH: 1.826 u[IU]/mL (ref 0.350–4.500)

## 2012-07-29 LAB — VITAMIN D 1,25 DIHYDROXY
Vitamin D2 1, 25 (OH)2: <8 pg/mL
Vitamin D3 1, 25 (OH)2: 49 pg/mL

## 2012-08-16 ENCOUNTER — Ambulatory Visit (INDEPENDENT_AMBULATORY_CARE_PROVIDER_SITE_OTHER): Payer: 59 | Admitting: Obstetrics & Gynecology

## 2012-08-16 ENCOUNTER — Encounter: Payer: Self-pay | Admitting: Obstetrics & Gynecology

## 2012-08-16 ENCOUNTER — Other Ambulatory Visit (HOSPITAL_COMMUNITY)
Admission: RE | Admit: 2012-08-16 | Discharge: 2012-08-16 | Disposition: A | Discharge status: Home / Self Care | Payer: 59 | Source: Ambulatory Visit | Attending: Obstetrics & Gynecology | Admitting: Obstetrics & Gynecology

## 2012-08-16 VITALS — BP 132/88 | Ht 61.5 in | Wt 197.0 lb

## 2012-08-16 DIAGNOSIS — Z1151 Encounter for screening for human papillomavirus (HPV): Secondary | ICD-10-CM | POA: Insufficient documentation

## 2012-08-16 DIAGNOSIS — Z01419 Encounter for gynecological examination (general) (routine) without abnormal findings: Secondary | ICD-10-CM

## 2012-08-16 NOTE — Progress Notes (Signed)
Patient ID: [name], female   DOB: 02-01-1965, 47 y.o.   MRN: [id number] Subjective:     [name] is a 47 y.o. female here for a routine exam.  Patient's last menstrual period was 07/30/2012. No obstetric history on file. Current complaints: none.  Personal health questionnaire reviewed: yes.  Regular menses, also decreased libido, no pain extensive questioning unsure if has had an orgasm, scream cream given Gynecologic History Patient's last menstrual period was 07/30/2012. Contraception: tubal ligation Last Pap: 2013. Results were: normal Last mammogram: 2014. Results were: biopsy negative  Obstetric History OB History   Grav Para Term Preterm Abortions TAB SAB Ect Mult Living                   The following portions of the patient's history were reviewed and updated as appropriate: allergies, current medications, past family history, past medical history, past social history, past surgical history and problem list.  Review of Systems  Review of Systems  Constitutional: Negative for fever, chills, weight loss, malaise/fatigue and diaphoresis.  HENT: Negative for hearing loss, ear pain, nosebleeds, congestion, sore throat, neck pain, tinnitus and ear discharge.   Eyes: Negative for blurred vision, double vision, photophobia, pain, discharge and redness.  Respiratory: Negative for cough, hemoptysis, sputum production, shortness of breath, wheezing and stridor.   Cardiovascular: Negative for chest pain, palpitations, orthopnea, claudication, leg swelling and PND.  Gastrointestinal: negative for abdominal pain. Negative for heartburn, nausea, vomiting, diarrhea, constipation, blood in stool and melena.  Genitourinary: Negative for dysuria, urgency, frequency, hematuria and flank pain.  Musculoskeletal: Negative for myalgias, back pain, joint pain and falls.  Skin: Negative for itching and rash.  Neurological: Negative for dizziness, tingling, tremors, sensory change,  speech change, focal weakness, seizures, loss of consciousness, weakness and headaches.  Endo/Heme/Allergies: Negative for environmental allergies and polydipsia. Does not bruise/bleed easily.  Psychiatric/Behavioral: Negative for depression, suicidal ideas, hallucinations, memory loss and substance abuse. The patient is not nervous/anxious and does not have insomnia.        Objective:    Physical Exam  Vitals reviewed. Constitutional: She is oriented to person, place, and time. She appears well-developed and well-nourished.  HENT:  Head: Normocephalic and atraumatic.        Right Ear: External ear normal.  Left Ear: External ear normal.  Nose: Nose normal.  Mouth/Throat: Oropharynx is clear and moist.  Eyes: Conjunctivae and EOM are normal. Pupils are equal, round, and reactive to light. Right eye exhibits no discharge. Left eye exhibits no discharge. No scleral icterus.  Neck: Normal range of motion. Neck supple. No tracheal deviation present. No thyromegaly present.  Cardiovascular: Normal rate, regular rhythm, normal heart sounds and intact distal pulses.  Exam reveals no gallop and no friction rub.   No murmur heard. Respiratory: Effort normal and breath sounds normal. No respiratory distress. She has no wheezes. She has no rales. She exhibits no tenderness.  GI: Soft. Bowel sounds are normal. She exhibits no distension and no mass. There is no tenderness. There is no rebound and no guarding.  Genitourinary:       Vulva is normal without lesions Vagina is pink moist without discharge Cervix normal in appearance and pap is done Uterus is normal size shape and contour Adnexa is negative with normal sized ovaries   Musculoskeletal: Normal range of motion. She exhibits no edema and no tenderness.  Neurological: She is alert and oriented to person, place, and time. She has normal reflexes.  She displays normal reflexes. No cranial nerve deficit. She exhibits normal muscle tone.  Coordination normal.  Skin: Skin is warm and dry. No rash noted. No erythema. No pallor.  Psychiatric: She has a normal mood and affect. Her behavior is normal. Judgment and thought content normal.       Assessment:    Healthy female exam.    Plan:    Follow up in: 1 year.

## 2012-09-11 ENCOUNTER — Telehealth: Payer: Self-pay

## 2012-09-11 ENCOUNTER — Encounter: Payer: Self-pay | Admitting: Family Medicine

## 2012-09-11 MED ORDER — LANSOPRAZOLE 30 MG PO CPDR: 30.0000 mg | DELAYED_RELEASE_CAPSULE | Freq: Every day | ORAL | Status: DC

## 2012-09-11 NOTE — Telephone Encounter (Signed)
Pt is requesting refills on prevacid. Pt uses [facility] .

## 2012-09-11 NOTE — Telephone Encounter (Signed)
Pt aware.

## 2012-12-06 ENCOUNTER — Other Ambulatory Visit: Payer: Self-pay

## 2013-01-08 ENCOUNTER — Encounter: Payer: Self-pay | Admitting: Gastroenterology

## 2013-01-08 MED ORDER — LANSOPRAZOLE 30 MG PO CPDR: DELAYED_RELEASE_CAPSULE | ORAL | Status: DC

## 2013-01-08 NOTE — Telephone Encounter (Signed)
PLEASE CALL PT. Rx sent. FOLLOW A LOW FAT DIET. SEE INFO BELOW. Prevacid 30 mins prior to meal bid. CONTINUE WEIGHT LOSS EFFORTS. Lose 10 lbs.  Low-Fat Diet BREADS, CEREALS, PASTA, RICE, DRIED PEAS, AND BEANS These products are high in carbohydrates and most are low in fat. Therefore, they can be increased in the diet as substitutes for fatty foods. They too, however, contain calories and should not be eaten in excess. Cereals can be eaten for snacks as well as for breakfast.   FRUITS AND VEGETABLES It is good to eat fruits and vegetables. Besides being sources of fiber, both are rich in vitamins and some minerals. They help you get the daily allowances of these nutrients. Fruits and vegetables can be used for snacks and desserts.  MEATS Limit lean meat, chicken, [place], and fish to no more than 6 ounces per day. Beef, Pork, and Lamb Use lean cuts of beef, pork, and lamb. Lean cuts include:  Extra-lean ground beef.  Arm roast.  Sirloin tip.  Center-cut ham.  Round steak.  Loin chops.  Rump roast.  Tenderloin.  Trim all fat off the outside of meats before cooking. It is not necessary to severely decrease the intake of red meat, but lean choices should be made. Lean meat is rich in protein and contains a highly absorbable form of iron. Premenopausal women, in particular, should avoid reducing lean red meat because this could increase the risk for low red blood cells (iron-deficiency anemia).  Chicken and [place] These are good sources of protein. The fat of poultry can be reduced by removing the skin and underlying fat layers before cooking. Chicken and [place] can be substituted for lean red meat in the diet. Poultry should not be fried or covered with high-fat sauces. Fish and Shellfish Fish is a good source of protein. Shellfish contain cholesterol, but they usually are low in saturated fatty acids. The preparation of fish is important. Like chicken and [place], they should not be fried or  covered with high-fat sauces. EGGS Egg whites contain no fat or cholesterol. They can be eaten often. Try 1 to 2 egg whites instead of whole eggs in recipes or use egg substitutes that do not contain yolk. MILK AND DAIRY PRODUCTS Use skim or 1% milk instead of 2% or whole milk. Decrease whole milk, natural, and processed cheeses. Use nonfat or low-fat (2%) cottage cheese or low-fat cheeses made from vegetable oils. Choose nonfat or low-fat (1 to 2%) yogurt. Experiment with evaporated skim milk in recipes that call for heavy cream. Substitute low-fat yogurt or low-fat cottage cheese for sour cream in dips and salad dressings. Have at least 2 servings of low-fat dairy products, such as 2 glasses of skim (or 1%) milk each day to help get your daily calcium intake. FATS AND OILS Reduce the total intake of fats, especially saturated fat. Butterfat, lard, and beef fats are high in saturated fat and cholesterol. These should be avoided as much as possible. Vegetable fats do not contain cholesterol, but certain vegetable fats, such as coconut oil, palm oil, and palm kernel oil are very high in saturated fats. These should be limited. These fats are often used in bakery goods, processed foods, popcorn, oils, and nondairy creamers. Vegetable shortenings and some peanut butters contain hydrogenated oils, which are also saturated fats. Read the labels on these foods and check for saturated vegetable oils. Unsaturated vegetable oils and fats do not raise blood cholesterol. However, they should be limited because they are  fats and are high in calories. Total fat should still be limited to 30% of your daily caloric intake. Desirable liquid vegetable oils are corn oil, cottonseed oil, olive oil, canola oil, safflower oil, soybean oil, and sunflower oil. Peanut oil is not as good, but small amounts are acceptable. Buy a heart-healthy tub margarine that has no partially hydrogenated oils in the ingredients. Mayonnaise and  salad dressings often are made from unsaturated fats, but they should also be limited because of their high calorie and fat content. Seeds, nuts, peanut butter, olives, and avocados are high in fat, but the fat is mainly the unsaturated type. These foods should be limited mainly to avoid excess calories and fat. OTHER EATING TIPS Snacks  Most sweets should be limited as snacks. They tend to be rich in calories and fats, and their caloric content outweighs their nutritional value. Some good choices in snacks are graham crackers, melba toast, soda crackers, bagels (no egg), English muffins, fruits, and vegetables. These snacks are preferable to snack crackers, [place] fries, TORTILLA CHIPS, and POTATO chips. Popcorn should be air-popped or cooked in small amounts of liquid vegetable oil. Desserts Eat fruit, low-fat yogurt, and fruit ices instead of pastries, cake, and cookies. Sherbet, angel food cake, gelatin dessert, frozen low-fat yogurt, or other frozen products that do not contain saturated fat (pure fruit juice bars, frozen ice pops) are also acceptable.  COOKING METHODS Choose those methods that use little or no fat. They include: Poaching.  Braising.  Steaming.  Grilling.  Baking.  Stir-frying.  Broiling.  Microwaving.  Foods can be cooked in a nonstick pan without added fat, or use a nonfat cooking spray in regular cookware. Limit fried foods and avoid frying in saturated fat. Add moisture to lean meats by using water, broth, cooking wines, and other nonfat or low-fat sauces along with the cooking methods mentioned above. Soups and stews should be chilled after cooking. The fat that forms on top after a few hours in the refrigerator should be skimmed off. When preparing meals, avoid using excess salt. Salt can contribute to raising blood pressure in some people.  EATING AWAY FROM HOME Order entres, potatoes, and vegetables without sauces or butter. When meat exceeds the size of a deck of  cards (3 to 4 ounces), the rest can be taken home for another meal. Choose vegetable or fruit salads and ask for low-calorie salad dressings to be served on the side. Use dressings sparingly. Limit high-fat toppings, such as bacon, crumbled eggs, cheese, sunflower seeds, and olives. Ask for heart-healthy tub margarine instead of butter.

## 2013-01-15 ENCOUNTER — Telehealth: Payer: Self-pay

## 2013-01-15 ENCOUNTER — Encounter: Payer: Self-pay | Admitting: Family Medicine

## 2013-01-15 ENCOUNTER — Ambulatory Visit (INDEPENDENT_AMBULATORY_CARE_PROVIDER_SITE_OTHER): Payer: 59 | Admitting: Family Medicine

## 2013-01-15 VITALS — BP 130/80 | HR 88 | Temp 98.3°F | Resp 18 | Ht 62.0 in | Wt 196.0 lb

## 2013-01-15 DIAGNOSIS — N3946 Mixed incontinence: Secondary | ICD-10-CM

## 2013-01-15 DIAGNOSIS — F39 Unspecified mood [affective] disorder: Secondary | ICD-10-CM

## 2013-01-15 DIAGNOSIS — I10 Essential (primary) hypertension: Secondary | ICD-10-CM

## 2013-01-15 DIAGNOSIS — F338 Other recurrent depressive disorders: Secondary | ICD-10-CM

## 2013-01-15 DIAGNOSIS — G4700 Insomnia, unspecified: Secondary | ICD-10-CM

## 2013-01-15 DIAGNOSIS — E669 Obesity, unspecified: Secondary | ICD-10-CM

## 2013-01-15 MED ORDER — SERTRALINE HCL 25 MG PO TABS: 25.0000 mg | ORAL_TABLET | Freq: Every day | ORAL | Status: DC

## 2013-01-15 MED ORDER — OXYBUTYNIN CHLORIDE ER 5 MG PO TB24: 5.0000 mg | ORAL_TABLET | Freq: Every day | ORAL | Status: DC

## 2013-01-15 NOTE — Assessment & Plan Note (Signed)
Her sleep has improved but she does have difficulty staying asleep. We'll try increasing the melatonin to 3 mg. If this does not help she will contact the we will consider trazodone for sleep

## 2013-01-15 NOTE — Assessment & Plan Note (Signed)
Discussed dietary changes, handout given

## 2013-01-15 NOTE — Patient Instructions (Signed)
Try the new bladder medication- call if this does not help Work on the weight  Tetanus given Meds refilled F/U 6 months or as needed

## 2013-01-15 NOTE — Assessment & Plan Note (Signed)
Well controlled no meds

## 2013-01-15 NOTE — Assessment & Plan Note (Signed)
Will try her on dig or PND 5 mg extended release

## 2013-01-15 NOTE — Progress Notes (Signed)
   Subjective:    Patient ID: [name], female    DOB: 1966-01-12, 47 y.o.   MRN: [id number]  HPI  Patient here to follow chronic medical problems. She continues to have difficulty with overactive bladder as well as urge incontinence. She will like to try medication for this. She's had multiple accidents throughout the day  She would like to restart her Zoloft this is the typical time she gets seasonal depression.  She is being followed by gastroenterology they return her on Prilosec for her acid reflux  Insomnia-she tried increasing the melatonin to 2 mg which helps with her sleep. She's continued to wake up around 4:30 AM and has difficulty falling back asleep   Review of Systems  GEN- denies fatigue, fever, weight loss,weakness, recent illness HEENT- denies eye drainage, change in vision, nasal discharge, CVS- denies chest pain, palpitations RESP- denies SOB, cough, wheeze ABD- denies N/V, change in stools, abd pain GU- denies dysuria, hematuria, dribbling, incontinence MSK- denies joint pain, muscle aches, injury Neuro- denies headache, dizziness, syncope, seizure activity      Objective:   Physical Exam GEN- NAD, alert and oriented x3 HEENT- PERRL, EOMI, non injected sclera, pink conjunctiva, MMM, oropharynx clear Neck- Supple,  CVS- RRR, no murmur RESP-CTAB EXT- No edema Pulses- Radial, DP- 2+ Psych- normal affect and mood       Assessment & Plan:

## 2013-01-15 NOTE — Telephone Encounter (Signed)
Pt went to pick up prevacid bid from the pharmacy and was told her insurance didn't cover it and it would cost her $60.00. Pt has never tried any other ppi's except prevacid and dexilant. pts preferred formulary includes qd generics and nexium. Per AS pt can try samples of prilosec to see if it helps symptoms. #10 samples given to pt and she is aware of recommendations.

## 2013-01-15 NOTE — Assessment & Plan Note (Signed)
Restart zoloft daily

## 2013-01-17 NOTE — Telephone Encounter (Signed)
Agree 

## 2013-01-21 ENCOUNTER — Encounter: Payer: Self-pay | Admitting: Family Medicine

## 2013-03-05 ENCOUNTER — Encounter: Payer: Self-pay | Admitting: Family Medicine

## 2013-03-11 ENCOUNTER — Other Ambulatory Visit: Payer: Self-pay | Admitting: Family Medicine

## 2013-03-11 ENCOUNTER — Encounter: Payer: Self-pay | Admitting: Family Medicine

## 2013-03-11 DIAGNOSIS — Z139 Encounter for screening, unspecified: Secondary | ICD-10-CM

## 2013-03-11 DIAGNOSIS — N6452 Nipple discharge: Secondary | ICD-10-CM

## 2013-03-18 ENCOUNTER — Ambulatory Visit (HOSPITAL_COMMUNITY): Payer: 59

## 2013-03-20 ENCOUNTER — Ambulatory Visit (HOSPITAL_COMMUNITY)
Admission: RE | Admit: 2013-03-20 | Discharge: 2013-03-20 | Disposition: A | Discharge status: Home / Self Care | Payer: 59 | Source: Ambulatory Visit | Attending: Family Medicine | Admitting: Family Medicine

## 2013-03-20 ENCOUNTER — Other Ambulatory Visit: Payer: Self-pay | Admitting: Family Medicine

## 2013-03-20 ENCOUNTER — Other Ambulatory Visit (HOSPITAL_COMMUNITY): Payer: Self-pay | Admitting: Obstetrics & Gynecology

## 2013-03-20 DIAGNOSIS — N6452 Nipple discharge: Secondary | ICD-10-CM

## 2013-03-20 DIAGNOSIS — N6049 Mammary duct ectasia of unspecified breast: Secondary | ICD-10-CM | POA: Insufficient documentation

## 2013-03-20 DIAGNOSIS — R928 Other abnormal and inconclusive findings on diagnostic imaging of breast: Secondary | ICD-10-CM

## 2013-03-20 DIAGNOSIS — N6459 Other signs and symptoms in breast: Secondary | ICD-10-CM | POA: Insufficient documentation

## 2013-03-26 ENCOUNTER — Telehealth: Payer: Self-pay | Admitting: *Deleted

## 2013-03-26 NOTE — Telephone Encounter (Signed)
Faxed over written order for biopsy to radiology dept at [phone]

## 2013-03-26 NOTE — Telephone Encounter (Signed)
I called Dee from Radiology she stated order just needs to be E-signed.

## 2013-03-26 NOTE — Telephone Encounter (Signed)
Please call them back and tell them to set up the E sign for the order and send it to me

## 2013-03-26 NOTE — Telephone Encounter (Signed)
Received call from [facility], Radiology Tech.   Reported that pt is scheduled for a breast biopsy on 2/25/205.  Requested order be faxed to radiology department.   Order to say: "[place] R Breast Biopsy. DX: nipple discharge".  Call back number [phone]/ Fax [phone].

## 2013-03-27 ENCOUNTER — Other Ambulatory Visit: Payer: Self-pay | Admitting: Family Medicine

## 2013-03-27 ENCOUNTER — Encounter (HOSPITAL_COMMUNITY): Payer: Self-pay

## 2013-03-27 ENCOUNTER — Ambulatory Visit (HOSPITAL_COMMUNITY)
Admission: RE | Admit: 2013-03-27 | Discharge: 2013-03-27 | Disposition: A | Discharge status: Home / Self Care | Payer: 59 | Source: Ambulatory Visit | Attending: Family Medicine | Admitting: Family Medicine

## 2013-03-27 VITALS — BP 153/98 | HR 98 | Temp 98.0°F | Resp 18

## 2013-03-27 DIAGNOSIS — R229 Localized swelling, mass and lump, unspecified: Principal | ICD-10-CM

## 2013-03-27 DIAGNOSIS — IMO0002 Reserved for concepts with insufficient information to code with codable children: Secondary | ICD-10-CM

## 2013-03-27 DIAGNOSIS — N63 Unspecified lump in unspecified breast: Secondary | ICD-10-CM | POA: Insufficient documentation

## 2013-03-27 DIAGNOSIS — N6452 Nipple discharge: Secondary | ICD-10-CM

## 2013-03-27 MED ORDER — LIDOCAINE HCL (PF) 2 % IJ SOLN
10.0000 mL | Freq: Once | INTRAMUSCULAR | Status: AC
  Administered 2013-03-27: 10 mL

## 2013-03-27 MED ORDER — LIDOCAINE HCL (PF) 2 % IJ SOLN
INTRAMUSCULAR | Status: AC
Start: 2013-03-27 — End: 2013-03-27
  Administered 2013-03-27: 10 mL
  Filled 2013-03-27: qty 10

## 2013-03-27 NOTE — Discharge Instructions (Signed)
Breast Biopsy °Care After °These instructions give you information on caring for yourself after your procedure. Your doctor may also give you more specific instructions. Call your doctor if you have any problems or questions after your procedure. °HOME CARE °· Only take medicine as told by your doctor. °· Do not take aspirin. °· Keep your sutures (stitches) dry when bathing. °· Protect the biopsy area. Do not let the area get bumped. °· Avoid activities that could pull the biopsy site open until your doctor approves. This includes: °· Stretching. °· Reaching. °· Exercise. °· Sports. °· Lifting more than 3lb. °· Continue your normal diet. °· Wear a good support bra for as long as told by your doctor. °· Change any bandages (dressings) as told by your doctor. °· Do not drink alcohol while taking pain medicine. °· Keep all doctor visits as told. Ask when your test results will be ready. Make sure you get your test results. °GET HELP RIGHT AWAY IF:  °· You have a fever. °· You have more bleeding (more than a small spot) from the biopsy site. °· You have trouble breathing. °· You have yellowish-white fluid (pus) coming from the biopsy site. °· You have redness, puffiness (swelling), or more pain in the biopsy site. °· You have a bad smell coming from the biopsy site. °· Your biopsy site opens after sutures, staples, or sticky strips have been removed. °· You have a rash. °· You need stronger medicine. °MAKE SURE YOU: °· Understand these instructions. °· Will watch your condition. °· Will get help right away if you are not doing well or get worse. °Document Released: 11/13/2008 Document Revised: 04/11/2011 Document Reviewed: 02/27/2011 °ExitCare® Patient Information ©2014 [employer]. ° °Breast Biopsy °A breast biopsy is a test during which a sample of tissue is taken from your breast. The breast tissue is looked at under a microscope for cancer cells.  °BEFORE THE PROCEDURE °· Make plans to have someone drive you home  after the test. °· Do not smoke for 2 weeks before the test. Stop smoking, if you smoke. °· Do not drink alcohol for 24 hours before the test. °· Wear a good support bra to the test. °PROCEDURE  °You may be given one of the following: °· A medicine to numb the breast area (local anesthesia). °· A medicine to make you sleep (general anesthesia). °There are different types of breast biopsies. They include: °· Fine-needle aspiration. °· A needle is put into the breast lump. °· The needle takes out fluid and cells from the lump. °· Ultrasound imaging may be used to help find the lump and to put the needle it the right spot. °· Core-needle biopsy. °· A needle is put into the breast lump. °· The needle is put in your breast 3 6 times. °· The needle removes breast tissue. °· An ultrasound image or X-ray is often used to find the right spot to put in the needle. °· Stereotactic biopsy. °· X-rays and a computer are used to study X-ray pictures of the breast lump. °· The computer finds where the needle needs to be put into the breast. °· Tissue samples are taken out. °· Vacuum-assisted biopsy. °· A small cut (incision) is made in your breast. °· A biopsy device is put through the cut and into the breast tissue. °· The biopsy device draws abnormal breast tissue into the biopsy device. °· A large tissue sample is often removed. °· No stitches are needed. °· Ultrasound-guided core-needle   biopsy. °· Ultrasound imaging helps guide the needle into the area of the breast that is not normal. °· A cut is made in the breast. The needle is put in the needle. °· Tissue samples are taken out. °· Open biopsy. °· A large cut is made in the breast. °· Your doctor will try to remove the whole breast lump or as much as possible. °All tissue, fluid, or cell samples are looked at under a microscope.  °AFTER THE PROCEDURE °· You will be taken to an area to recover. You will be able to go home once you are doing well and are without  problems. °· You may have bruising on your breast. This is normal. °· A pressure bandage (dressing) may be put on your breast for 24 8 hours. This type of bandage is wrapped tightly around your chest. It helps stop fluid from building up underneath tissues. °Document Released: 04/11/2011 Document Reviewed: 04/11/2011 °ExitCare® Patient Information ©2014 [employer]. ° °

## 2013-03-27 NOTE — Progress Notes (Signed)
Breast biopsy complete no signs of distress.

## 2013-04-23 NOTE — Patient Instructions (Addendum)
[name]  04/23/2013   Your procedure is scheduled on:  Friday, March 27  Report to [facility] at 07:10 AM.  Call this number if you have problems the morning of surgery: [phone]   Remember:   Do not eat food or drink liquids after midnight.   Take these medicines the morning of surgery with A SIP OF WATER: Zoloft   Do not wear jewelry, make-up or nail polish.  Do not wear lotions, powders, or perfumes. You may wear deodorant.  Do not shave 48 hours prior to surgery. Men may shave face and neck.  Do not bring valuables to the hospital.  [facility] is not responsible                  for any belongings or valuables.               Contacts, dentures or bridgework may not be worn into surgery.  Leave suitcase in the car. After surgery it may be brought to your room.  For patients admitted to the hospital, discharge time is determined by your                treatment team.               Patients discharged the day of surgery will not be allowed to drive  home.  Name and phone number of your driver: Family or friend  Special Instructions: Shower using CHG 2 nights before surgery and the night before surgery.  If you shower the day of surgery use CHG.  Use special wash - you have one bottle of CHG for all showers.  You should use approximately 1/3 of the bottle for each shower.   Please read over the following fact sheets that you were given: Pain Booklet, Coughing and Deep Breathing, MRSA Information, Surgical Site Infection Prevention, Anesthesia Post-op Instructions and Care and Recovery After Surgery  Breast Biopsy A breast biopsy is a procedure where a sample of breast tissue is removed from your breast. The tissue is examined under a microscope to see if cancerous cells are present. A breast biopsy is done when there is:  Any undiagnosed breast mass (tumor).  Nipple abnormalities, dimpling, crusting, or ulcerations.  Abnormal discharge from the nipple, especially  blood.  Redness, swelling, and pain of the breast.  Calcium deposits (calcifications) or abnormalities seen on a mammogram, ultrasound result, or results of magnetic resonance imaging (MRI).  Suspicious changes in the breast seen on your mammogram. If the tumor is found to be cancerous (malignant), a breast biopsy can help to determine what the best treatment is for you. There are many different types of breast biopsies. Talk to your caregiver about your options and which type is best for you. LET YOUR CAREGIVER KNOW ABOUT:  Allergies to food or medicine.  Medicines taken, including vitamins, herbs, eyedrops, over-the-counter medicines, and creams.  Use of steroids (by mouth or creams).  Previous problems with anesthetics or numbing medicines.  History of bleeding problems or blood clots.  Previous surgery.  Other health problems, including diabetes and kidney problems.  Any recent colds or infections.  Possibility of pregnancy, if this applies. RISKS AND COMPLICATIONS   Bleeding.  Infection.  Allergy to medicines.  Bruising and swelling of the breast.  Alteration in the shape of the breast.  Not finding the lump or abnormality.  Needing more surgery. BEFORE THE PROCEDURE  Arrange for someone to drive you home after the  procedure.  Do not smoke for 2 weeks before the procedure. Stop smoking, if you smoke.  Do not drink alcohol for 24 hours before procedure.  Wear a good support bra to the procedure. PROCEDURE  You may be given a medicine to numb the breast area (local anesthesia) or a medicine to make you sleep (general anesthesia) during the procedure. The following are the different types of biopsies that can be performed.   Fine-needle aspiration A thin needle is attached to a syringe and inserted into the breast lump. Fluid and cells are removed and then looked at under a microscope. If the breast lump cannot be felt, an ultrasound may be used to help locate  the lump and place the needle in the correct area.   Core needle biopsy A wide, hollow needle (core needle) is inserted into the breast lump 3 6 times to get tissue samples or cores. The samples are removed. The needle is usually placed in the correct area by using an ultrasound or X-ray.   Stereotactic biopsy X-ray equipment and a computer are used to analyze X-ray pictures of the breast lump. The computer then finds exactly where the core needle needs to be inserted. Tissue samples are removed.   Vacuum-assisted biopsy A small incision (less than  inch) is made in your breast. A biopsy device that includes a hollow needle and vacuum is passed through the incision and into the breast tissue. The vacuum gently draws abnormal breast tissue into the needle to remove it. This type of biopsy removes a larger tissue sample than a regular core needle biopsy. No stitches are needed, and there is usually little scarring.  Ultrasound-guided core needle biopsy A high frequency ultrasound helps guide the core needle to the area of the mass or abnormality. An incision is made to insert the needle. Tissue samples are removed.  Open biopsy A larger incision is made in the breast. Your caregiver will attempt to remove the whole breast lump or as much as possible. AFTER THE PROCEDURE  You will be taken to the recovery area. If you are doing well and have no problems, you will be allowed to go home.  You may notice bruising on your breast. This is normal.  Your caregiver may apply a pressure dressing on your breast for 24 48 hours. A pressure dressing is a bandage that is wrapped tightly around the chest to stop fluid from collecting underneath tissues. Document Released: 01/17/2005 Document Revised: 05/14/2012 Document Reviewed: 02/17/2011 [facility] Patient Information 2014 [place]. PATIENT INSTRUCTIONS POST-ANESTHESIA  IMMEDIATELY FOLLOWING SURGERY:  Do not drive or operate machinery for the first  twenty four hours after surgery.  Do not make any important decisions for twenty four hours after surgery or while taking narcotic pain medications or sedatives.  If you develop intractable nausea and vomiting or a severe headache please notify your doctor immediately.  FOLLOW-UP:  Please make an appointment with your surgeon as instructed. You do not need to follow up with anesthesia unless specifically instructed to do so.  WOUND CARE INSTRUCTIONS (if applicable):  Keep a dry clean dressing on the anesthesia/puncture wound site if there is drainage.  Once the wound has quit draining you may leave it open to air.  Generally you should leave the bandage intact for twenty four hours unless there is drainage.  If the epidural site drains for more than 36-48 hours please call the anesthesia department.  QUESTIONS?:  Please feel free to call your physician or  the hospital operator if you have any questions, and they will be happy to assist you.

## 2013-04-24 ENCOUNTER — Encounter (HOSPITAL_COMMUNITY): Payer: Self-pay | Admitting: Pharmacy Technician

## 2013-04-24 ENCOUNTER — Encounter (HOSPITAL_COMMUNITY): Payer: Self-pay

## 2013-04-24 ENCOUNTER — Encounter (HOSPITAL_COMMUNITY)
Admission: RE | Admit: 2013-04-24 | Discharge: 2013-04-24 | Disposition: A | Discharge status: Home / Self Care | Payer: 59 | Source: Ambulatory Visit | Attending: General Surgery | Admitting: General Surgery

## 2013-04-24 LAB — CBC WITH DIFFERENTIAL/PLATELET
Basophils Absolute: 0 10*3/uL (ref 0.0–0.1)
Basophils Relative: 1 % (ref 0–1)
EOS ABS: 0.1 10*3/uL (ref 0.0–0.7)
Eosinophils Relative: 2 % (ref 0–5)
HEMATOCRIT: 38.5 % (ref 36.0–46.0)
HEMOGLOBIN: 12.8 g/dL (ref 12.0–15.0)
LYMPHS ABS: 2 10*3/uL (ref 0.7–4.0)
Lymphocytes Relative: 33 % (ref 12–46)
MCH: 27.8 pg (ref 26.0–34.0)
MCHC: 33.2 g/dL (ref 30.0–36.0)
MCV: 83.5 fL (ref 78.0–100.0)
MONOS PCT: 6 % (ref 3–12)
Monocytes Absolute: 0.4 10*3/uL (ref 0.1–1.0)
NEUTROS ABS: 3.5 10*3/uL (ref 1.7–7.7)
NEUTROS PCT: 58 % (ref 43–77)
Platelets: 327 10*3/uL (ref 150–400)
RBC: 4.61 MIL/uL (ref 3.87–5.11)
RDW: 14.3 % (ref 11.5–15.5)
WBC: 6 10*3/uL (ref 4.0–10.5)

## 2013-04-24 LAB — BASIC METABOLIC PANEL
BUN: 10 mg/dL (ref 6–23)
CHLORIDE: 108 meq/L (ref 96–112)
CO2: 26 meq/L (ref 19–32)
Calcium: 8.8 mg/dL (ref 8.4–10.5)
Creatinine, Ser: 0.68 mg/dL (ref 0.50–1.10)
GFR calc Af Amer: >90 mL/min (ref >90)
GFR calc non Af Amer: >90 mL/min (ref >90)
GLUCOSE: 120 mg/dL — AB (ref 70–99)
POTASSIUM: 4.1 meq/L (ref 3.7–5.3)
Sodium: 144 mEq/L (ref 137–147)

## 2013-04-24 NOTE — H&P (Signed)
  NTS SOAP Note  Vital Signs:  Vitals as of: 7/61/6073: Systolic 710: Diastolic 93: Heart Rate 77: Temp 97.76F: Height 58ft 3in: Weight 195Lbs 0 Ounces: BMI 34.54  BMI : 34.54 kg/m2  Subjective: This 48 Years 32 Months old Female presents for of a right nipple discharge.  Has been present intermittently for some time.  Mammogram showed abnormal retroareolar region.  Core biopsy shows sclerosing lesion.  Referred for biopsy.  No family h/o breast cancer.  Had previous biopsies in the past, negative for malignancy.  Review of Symptoms:  Constitutional:unremarkable   Head:unremarkable    Eyes:unremarkable   Nose/Mouth/Throat:unremarkable Cardiovascular:  unremarkable   Respiratory:unremarkable   Gastrointestinal:  unremarkable   Genitourinary:unremarkable       joint, neck, and back pain Skin:unremarkable   as above Hematolgic/Lymphatic:unremarkable     Allergic/Immunologic:unremarkable     Past Medical History:    Reviewed  Past Medical History  Surgical History: biopsies of both breasts for fibroadenomas, BTL Psychiatric History:  Depression Allergies: nkda Medications: none   Social History:Reviewed  Social History  Preferred Language: English ([place]) Race:  White Ethnicity: Not Hispanic / Latino Age: 48 Years 4 Months Marital Status:  M Alcohol:  No Recreational drug(s):  No   Smoking Status: Never smoker reviewed on 04/23/2013 Functional Status reviewed on 04/23/2013 ------------------------------------------------ Bathing: Normal Cooking: Normal Dressing: Normal Driving: Normal Eating: Normal Managing Meds: Normal Oral Care: Normal Shopping: Normal Toileting: Normal Transferring: Normal Walking: Normal Cognitive Status reviewed on 04/23/2013 ------------------------------------------------ Attention: Normal Decision Making: Normal Language: Normal Memory: Normal Motor: Normal Perception:  Normal Problem Solving: Normal Visual and Spatial: Normal   Family History:  Reviewed  Family Health History Mother, Living; Healthy; healthy Father, Living; Healthy; healthy    Objective Information: General:  Well appearing, well nourished in no distress. Neck:  Supple without lymphadenopathy.  Heart:  RRR, no murmur Lungs:    CTA bilaterally, no wheezes, rhonchi, rales.  Breathing unlabored.     Clear nipple discharge from central ductule in right nipple.  Dominant mass in the upper inner quadrant.  Axilla negative for palpable nodes.  Left breast unremarkable.  Left axilla negative.  Assessment:Right nipple discharge, right breast mass  Diagnoses: 611.72 Breast lump (Unspecified lump in breast)  Procedures: 99214 - OFFICE OUTPATIENT VISIT 25 MINUTES    Plan:  Scheduled for right breast biopsies x 2 on 04/26/13.   Patient Education:Alternative treatments to surgery were discussed with patient (and family).  Risks and benefits  of procedure were fully explained to the patient (and family) who gave informed consent. Patient/family questions were addressed.  Follow-up:Pending Surgery

## 2013-04-26 ENCOUNTER — Encounter (HOSPITAL_COMMUNITY)
Admission: RE | Disposition: A | Discharge status: Home / Self Care | Payer: Self-pay | Source: Ambulatory Visit | Attending: General Surgery

## 2013-04-26 ENCOUNTER — Encounter (HOSPITAL_COMMUNITY): Payer: 59 | Admitting: Anesthesiology

## 2013-04-26 ENCOUNTER — Ambulatory Visit (HOSPITAL_COMMUNITY)
Admission: RE | Admit: 2013-04-26 | Discharge: 2013-04-26 | Disposition: A | Discharge status: Home / Self Care | Payer: 59 | Source: Ambulatory Visit | Attending: General Surgery | Admitting: General Surgery

## 2013-04-26 ENCOUNTER — Encounter (HOSPITAL_COMMUNITY): Payer: Self-pay | Admitting: *Deleted

## 2013-04-26 ENCOUNTER — Ambulatory Visit (HOSPITAL_COMMUNITY): Payer: 59 | Admitting: Anesthesiology

## 2013-04-26 DIAGNOSIS — N6089 Other benign mammary dysplasias of unspecified breast: Secondary | ICD-10-CM | POA: Insufficient documentation

## 2013-04-26 DIAGNOSIS — I10 Essential (primary) hypertension: Secondary | ICD-10-CM | POA: Insufficient documentation

## 2013-04-26 DIAGNOSIS — N6459 Other signs and symptoms in breast: Secondary | ICD-10-CM | POA: Insufficient documentation

## 2013-04-26 DIAGNOSIS — D249 Benign neoplasm of unspecified breast: Secondary | ICD-10-CM | POA: Insufficient documentation

## 2013-04-26 DIAGNOSIS — Z79899 Other long term (current) drug therapy: Secondary | ICD-10-CM | POA: Insufficient documentation

## 2013-04-26 DIAGNOSIS — F329 Major depressive disorder, single episode, unspecified: Secondary | ICD-10-CM | POA: Insufficient documentation

## 2013-04-26 DIAGNOSIS — F3289 Other specified depressive episodes: Secondary | ICD-10-CM | POA: Insufficient documentation

## 2013-04-26 HISTORY — PX: BREAST BIOPSY: SHX20

## 2013-04-26 SURGERY — BREAST BIOPSY
Anesthesia: General | Site: Breast | Laterality: Right

## 2013-04-26 MED ORDER — CHLORHEXIDINE GLUCONATE 4 % EX LIQD: 1.0000 "application " | Freq: Once | CUTANEOUS | Status: DC

## 2013-04-26 MED ORDER — GLYCOPYRROLATE 0.2 MG/ML IJ SOLN
0.2000 mg | Freq: Once | INTRAMUSCULAR | Status: AC
  Administered 2013-04-26: 0.2 mg via INTRAVENOUS

## 2013-04-26 MED ORDER — ONDANSETRON HCL 4 MG/2ML IJ SOLN: 4.0000 mg | Freq: Once | INTRAMUSCULAR | Status: DC | PRN

## 2013-04-26 MED ORDER — PROPOFOL 10 MG/ML IV BOLUS
INTRAVENOUS | Status: AC
  Filled 2013-04-26: qty 20

## 2013-04-26 MED ORDER — LACTATED RINGERS IV SOLN
INTRAVENOUS | Status: DC | PRN
  Administered 2013-04-26: 07:00:00 via INTRAVENOUS

## 2013-04-26 MED ORDER — ARTIFICIAL TEARS OP OINT
TOPICAL_OINTMENT | OPHTHALMIC | Status: AC
  Filled 2013-04-26: qty 3.5

## 2013-04-26 MED ORDER — FENTANYL CITRATE 0.05 MG/ML IJ SOLN
25.0000 ug | INTRAMUSCULAR | Status: AC
  Administered 2013-04-26 (×2): 25 ug via INTRAVENOUS

## 2013-04-26 MED ORDER — MIDAZOLAM HCL 2 MG/2ML IJ SOLN
INTRAMUSCULAR | Status: AC
  Filled 2013-04-26: qty 2

## 2013-04-26 MED ORDER — BUPIVACAINE HCL (PF) 0.5 % IJ SOLN
INTRAMUSCULAR | Status: AC
  Filled 2013-04-26: qty 30

## 2013-04-26 MED ORDER — LACTATED RINGERS IV SOLN
INTRAVENOUS | Status: DC
  Administered 2013-04-26: 1000 mL via INTRAVENOUS

## 2013-04-26 MED ORDER — ONDANSETRON HCL 4 MG/2ML IJ SOLN
4.0000 mg | Freq: Once | INTRAMUSCULAR | Status: AC
  Administered 2013-04-26: 4 mg via INTRAVENOUS
  Filled 2013-04-26: qty 2

## 2013-04-26 MED ORDER — FENTANYL CITRATE 0.05 MG/ML IJ SOLN
INTRAMUSCULAR | Status: AC
  Filled 2013-04-26: qty 5

## 2013-04-26 MED ORDER — MIDAZOLAM HCL 2 MG/2ML IJ SOLN
1.0000 mg | INTRAMUSCULAR | Status: DC | PRN
  Administered 2013-04-26 (×2): 2 mg via INTRAVENOUS
  Filled 2013-04-26: qty 2

## 2013-04-26 MED ORDER — FENTANYL CITRATE 0.05 MG/ML IJ SOLN: 25.0000 ug | INTRAMUSCULAR | Status: DC | PRN

## 2013-04-26 MED ORDER — HYDROCODONE-ACETAMINOPHEN 5-325 MG PO TABS: 1.0000 | ORAL_TABLET | Freq: Four times a day (QID) | ORAL | Status: DC | PRN

## 2013-04-26 MED ORDER — LIDOCAINE HCL (CARDIAC) 20 MG/ML IV SOLN
INTRAVENOUS | Status: DC | PRN
  Administered 2013-04-26: 50 mg via INTRAVENOUS

## 2013-04-26 MED ORDER — LIDOCAINE HCL (PF) 1 % IJ SOLN
INTRAMUSCULAR | Status: AC
  Filled 2013-04-26: qty 10

## 2013-04-26 MED ORDER — PROPOFOL 10 MG/ML IV BOLUS
INTRAVENOUS | Status: DC | PRN
  Administered 2013-04-26: 150 mg via INTRAVENOUS

## 2013-04-26 MED ORDER — GLYCOPYRROLATE 0.2 MG/ML IJ SOLN
INTRAMUSCULAR | Status: AC
  Filled 2013-04-26: qty 1

## 2013-04-26 MED ORDER — KETOROLAC TROMETHAMINE 30 MG/ML IJ SOLN
INTRAMUSCULAR | Status: AC
  Filled 2013-04-26: qty 1

## 2013-04-26 MED ORDER — DEXAMETHASONE SODIUM PHOSPHATE 4 MG/ML IJ SOLN
INTRAMUSCULAR | Status: AC
  Filled 2013-04-26: qty 1

## 2013-04-26 MED ORDER — ARTIFICIAL TEARS OP OINT
TOPICAL_OINTMENT | OPHTHALMIC | Status: DC | PRN
  Administered 2013-04-26: 1 via OPHTHALMIC

## 2013-04-26 MED ORDER — CEFAZOLIN SODIUM-DEXTROSE 2-3 GM-% IV SOLR
2.0000 g | INTRAVENOUS | Status: AC
  Administered 2013-04-26: 2 g via INTRAVENOUS
  Filled 2013-04-26: qty 50

## 2013-04-26 MED ORDER — ONDANSETRON HCL 4 MG/2ML IJ SOLN
INTRAMUSCULAR | Status: AC
  Filled 2013-04-26: qty 2

## 2013-04-26 MED ORDER — BUPIVACAINE HCL (PF) 0.5 % IJ SOLN
INTRAMUSCULAR | Status: DC | PRN
  Administered 2013-04-26: 10 mL

## 2013-04-26 MED ORDER — FENTANYL CITRATE 0.05 MG/ML IJ SOLN
INTRAMUSCULAR | Status: AC
  Filled 2013-04-26: qty 2

## 2013-04-26 MED ORDER — KETOROLAC TROMETHAMINE 30 MG/ML IJ SOLN
30.0000 mg | Freq: Once | INTRAMUSCULAR | Status: AC
  Administered 2013-04-26: 30 mg via INTRAVENOUS

## 2013-04-26 MED ORDER — FENTANYL CITRATE 0.05 MG/ML IJ SOLN
INTRAMUSCULAR | Status: DC | PRN
  Administered 2013-04-26 (×2): 50 ug via INTRAVENOUS
  Administered 2013-04-26: 100 ug via INTRAVENOUS
  Administered 2013-04-26: 50 ug via INTRAVENOUS

## 2013-04-26 MED ORDER — 0.9 % SODIUM CHLORIDE (POUR BTL) OPTIME
TOPICAL | Status: DC | PRN
  Administered 2013-04-26: 1000 mL

## 2013-04-26 MED ORDER — DEXAMETHASONE SODIUM PHOSPHATE 4 MG/ML IJ SOLN
4.0000 mg | Freq: Once | INTRAMUSCULAR | Status: AC
  Administered 2013-04-26: 4 mg via INTRAVENOUS

## 2013-04-26 SURGICAL SUPPLY — 29 items
ADH SKN CLS APL DERMABOND .7 (GAUZE/BANDAGES/DRESSINGS) ×1
BAG HAMPER (MISCELLANEOUS) ×2 IMPLANT
CLOTH BEACON ORANGE TIMEOUT ST (SAFETY) ×2 IMPLANT
COVER LIGHT HANDLE STERIS (MISCELLANEOUS) ×4 IMPLANT
DERMABOND ADVANCED (GAUZE/BANDAGES/DRESSINGS) ×1
DERMABOND ADVANCED .7 DNX12 (GAUZE/BANDAGES/DRESSINGS) IMPLANT
DURAPREP 26ML APPLICATOR (WOUND CARE) ×2 IMPLANT
ELECT REM PT RETURN 9FT ADLT (ELECTROSURGICAL) ×2
ELECTRODE REM PT RTRN 9FT ADLT (ELECTROSURGICAL) ×1 IMPLANT
FORMALIN 10 PREFIL 120ML (MISCELLANEOUS) ×2 IMPLANT
GLOVE BIO SURGEON STRL SZ7.5 (GLOVE) ×2 IMPLANT
GOWN STRL REUS W/TWL LRG LVL3 (GOWN DISPOSABLE) ×4 IMPLANT
KIT ROOM TURNOVER APOR (KITS) ×2 IMPLANT
MANIFOLD NEPTUNE II (INSTRUMENTS) ×2 IMPLANT
NDL HYPO 18GX1.5 BLUNT FILL (NEEDLE) ×1 IMPLANT
NDL HYPO 25X1 1.5 SAFETY (NEEDLE) ×1 IMPLANT
NEEDLE HYPO 18GX1.5 BLUNT FILL (NEEDLE) ×2 IMPLANT
NEEDLE HYPO 25X1 1.5 SAFETY (NEEDLE) ×2 IMPLANT
NS IRRIG 1000ML POUR BTL (IV SOLUTION) ×2 IMPLANT
PACK MINOR (CUSTOM PROCEDURE TRAY) ×2 IMPLANT
PAD ARMBOARD 7.5X6 YLW CONV (MISCELLANEOUS) ×2 IMPLANT
SET BASIN LINEN APH (SET/KITS/TRAYS/PACK) ×2 IMPLANT
SPONGE GAUZE 2X2 8PLY STRL LF (GAUZE/BANDAGES/DRESSINGS) IMPLANT
STRIP CLOSURE SKIN 1/4X3 (GAUZE/BANDAGES/DRESSINGS) IMPLANT
SUT SILK 2 0 SH (SUTURE) ×2 IMPLANT
SUT VIC AB 3-0 SH 27 (SUTURE) ×2
SUT VIC AB 3-0 SH 27X BRD (SUTURE) ×1 IMPLANT
SUT VIC AB 4-0 PS2 27 (SUTURE) ×2 IMPLANT
SYR CONTROL 10ML LL (SYRINGE) ×2 IMPLANT

## 2013-04-26 NOTE — Anesthesia Procedure Notes (Signed)
Procedure Name: LMA Insertion Date/Time: 04/26/2013 8:49 AM Performed by: Andree Elk, AMY A Pre-anesthesia Checklist: Patient identified, Timeout performed, Emergency Drugs available, Suction available and Patient being monitored Patient Re-evaluated:Patient Re-evaluated prior to inductionOxygen Delivery Method: Circle system utilized Preoxygenation: Pre-oxygenation with 100% oxygen Intubation Type: IV induction Ventilation: Mask ventilation without difficulty LMA: LMA inserted LMA Size: 4.0 Number of attempts: 1 Placement Confirmation: positive ETCO2 and breath sounds checked- equal and bilateral Tube secured with: Tape Dental Injury: Teeth and Oropharynx as per pre-operative assessment

## 2013-04-26 NOTE — Interval H&P Note (Signed)
History and Physical Interval Note:  04/26/2013 8:10 AM  [name]  has presented today for surgery, with the diagnosis of right breast neoplasm x2  The various methods of treatment have been discussed with the patient and family. After consideration of risks, benefits and other options for treatment, the patient has consented to  Procedure(s): BREAST BIOPSY x two (Right) as a surgical intervention .  The patient's history has been reviewed, patient examined, no change in status, stable for surgery.  I have reviewed the patient's chart and labs.  Questions were answered to the patient's satisfaction.     Aviva Signs A

## 2013-04-26 NOTE — Preoperative (Signed)
Beta Blockers   Reason not to administer Beta Blockers:Not Applicable 

## 2013-04-26 NOTE — Discharge Instructions (Signed)
Breast Biopsy  °Care After °Refer to this sheet in the next few weeks. These instructions provide you with information on caring for yourself after your procedure. Your caregiver may also give you more specific instructions. Your treatment has been planned according to current medical practices, but problems sometimes occur. Call your caregiver if you have any problems or questions after your procedure. °HOME CARE INSTRUCTIONS  °· Only take over-the-counter or prescription medicines for pain, discomfort, or fever as directed by your caregiver. °· Do not take aspirin. It can cause bleeding. °· Keep stitches dry when bathing. °· Protect the biopsy area. Do not let the area get bumped. °· Avoid activities that may pull the incision site open until approved by your caregiver. This can include stretching, reaching, exercise, sports, or lifting over 3 pounds. °· Resume your usual diet. °· Wear a good support bra for as long as directed by your caregiver. °· Change any bandages (dressings) as directed by your caregiver. °· Do not drink alcohol while taking pain medicine. °· Keep all your follow-up appointments with your caregiver. Ask when your test results will be ready. Make sure you get your test results. °SEEK MEDICAL CARE IF:  °· You have redness, swelling, or increasing pain in the biopsy site. °· You have a bad smell coming from the biopsy site or dressing. °· Your biopsy site breaks open after the stitches (sutures), staples, or skin adhesive strips have been removed. °· You have a rash. °· You need stronger medicine. °SEEK IMMEDIATE MEDICAL CARE IF:  °· You have a fever. °· You have increased bleeding (more than a small spot) from the biopsy site. °· You have difficulty breathing. °· You have pus coming from the biopsy site. °MAKE SURE YOU: °· Understand these instructions. °· Will watch your condition. °· Will get help right away if you are not doing well or get worse. °Document Released: 08/06/2004 Document  Revised: 04/11/2011 Document Reviewed: 02/17/2011 °ExitCare® Patient Information ©2014 [employer]. ° °

## 2013-04-26 NOTE — Anesthesia Preprocedure Evaluation (Signed)
Anesthesia Evaluation  Patient identified by MRN, date of birth, ID band Patient awake    Reviewed: Allergy & Precautions, H&P , NPO status , Patient's Chart, lab work & pertinent test results  History of Anesthesia Complications (+) PONV and history of anesthetic complications  Airway Mallampati: III TM Distance: >3 FB Neck ROM: Full    Dental   Pulmonary  breath sounds clear to auscultation        Cardiovascular hypertension (no meds now), Rhythm:Regular Rate:Normal     Neuro/Psych PSYCHIATRIC DISORDERS Depression    GI/Hepatic Gastritis hx    Endo/Other    Renal/GU      Musculoskeletal   Abdominal   Peds  Hematology   Anesthesia Other Findings   Reproductive/Obstetrics                           Anesthesia Physical Anesthesia Plan  ASA: II  Anesthesia Plan: General   Post-op Pain Management:    Induction: Intravenous  Airway Management Planned: LMA  Additional Equipment:   Intra-op Plan:   Post-operative Plan: Extubation in OR  Informed Consent: I have reviewed the patients History and Physical, chart, labs and discussed the procedure including the risks, benefits and alternatives for the proposed anesthesia with the patient or authorized representative who has indicated his/her understanding and acceptance.     Plan Discussed with:   Anesthesia Plan Comments:         Anesthesia Quick Evaluation

## 2013-04-26 NOTE — Anesthesia Postprocedure Evaluation (Signed)
  Anesthesia Post-op Note  Patient: [name]  Procedure(s) Performed: Procedure(s): BREAST BIOPSY TIMES TWO (Right)  Patient Location: PACU  Anesthesia Type:General  Level of Consciousness: sedated and patient cooperative  Airway and Oxygen Therapy: Patient Spontanous Breathing and Patient connected to face mask oxygen  Post-op Pain: none  Post-op Assessment: Post-op Vital signs reviewed, Patient's Cardiovascular Status Stable, Respiratory Function Stable, Patent Airway, No signs of Nausea or vomiting and Pain level controlled  Post-op Vital Signs: Reviewed and stable  Complications: No apparent anesthesia complications

## 2013-04-26 NOTE — Transfer of Care (Signed)
Immediate Anesthesia Transfer of Care Note  Patient: [name]  Procedure(s) Performed: Procedure(s): BREAST BIOPSY TIMES TWO (Right)  Patient Location: PACU  Anesthesia Type:General  Level of Consciousness: sedated and patient cooperative  Airway & Oxygen Therapy: Patient Spontanous Breathing and Patient connected to face mask oxygen  Post-op Assessment: Report given to PACU RN and Post -op Vital signs reviewed and stable  Post vital signs: Reviewed and stable  Complications: No apparent anesthesia complications

## 2013-04-26 NOTE — Op Note (Signed)
Patient:  [name]  DOB:  [birth year]  MRN:  [id number]   Preop Diagnosis:  Right breast neoplasm, right nipple discharge  Postop Diagnosis:  Same  Procedure:  Right breast biopsy x2  Surgeon:  Aviva Signs, M.D.  Anes:  General  Indications:  Patient is a 48 year old white female who was found to have both a breast mass in the upper, inner quadrant of the right breast as well as nipple discharge in the right breast. 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 Patient now comes the operating room for right breast biopsy of both regions. The risks and benefits of the procedures were fully explained to the patient, who gave informed consent.  Procedure note:  Patient is placed the supine position. After general anesthesia was a minister, the right breast was prepped and draped using usual sterile technique with DuraPrep. Surgical site confirmation was performed.  An infra-areolar region was made in order to expose the retroareolar region. A probe was used to identify the affected ductal. Once was identified. the ductal along with surrounding tissue was removed without difficulty. Sent to pathology further examination. A bleeding was controlled using Bovie electrocautery. The wound was irrigated normal saline. The skin was closed a 4-0 Vicryl subcuticular suture. Dermabond was applied at the end of both procedures.  Next, an incision was made over a 3-4 cm mass in the upper, inner quadrant of the right breast. The dissection was taken down to the mass. The mass was excised without difficulty. It appeared to be a fibroma. A bleeding was controlled using Bovie electrocautery. The wound was irrigated normal saline. The skin was injected with 0.5% Sensorcaine. The incision was closed using a 4-0 Vicryl subcuticular suture. Dermabond was applied.  All tape and needle counts were correct at the end of the procedure. Patient was awakened in the operating room and  transferred to the PACU in stable condition.  Complications:  None  EBL:  Minimal  Specimen:  Right breast biopsy #1, retroareolar  Right breast biopsy #2, upper inner quadrant

## 2013-05-01 ENCOUNTER — Encounter (HOSPITAL_COMMUNITY): Payer: Self-pay | Admitting: General Surgery

## 2013-05-30 ENCOUNTER — Ambulatory Visit (INDEPENDENT_AMBULATORY_CARE_PROVIDER_SITE_OTHER): Payer: 59 | Admitting: Physician Assistant

## 2013-05-30 ENCOUNTER — Encounter: Payer: Self-pay | Admitting: Physician Assistant

## 2013-05-30 ENCOUNTER — Ambulatory Visit: Payer: Self-pay | Admitting: Physician Assistant

## 2013-05-30 VITALS — BP 124/80 | HR 88 | Temp 99.3°F | Resp 18 | Wt 197.0 lb

## 2013-05-30 DIAGNOSIS — A499 Bacterial infection, unspecified: Secondary | ICD-10-CM

## 2013-05-30 DIAGNOSIS — B9689 Other specified bacterial agents as the cause of diseases classified elsewhere: Secondary | ICD-10-CM

## 2013-05-30 DIAGNOSIS — J988 Other specified respiratory disorders: Secondary | ICD-10-CM

## 2013-05-30 MED ORDER — AZITHROMYCIN 250 MG PO TABS: ORAL_TABLET | ORAL | Status: DC

## 2013-05-31 NOTE — Progress Notes (Signed)
    Patient ID: [name] MRN: [id number], DOB: 1965-07-30, 48 y.o. Date of Encounter: 05/31/2013, 8:52 AM    Chief Complaint:  Chief Complaint  Patient presents with  . x 3 days sinus pressure, cough, sneezing    chest hurts     HPI: 48 y.o. year old female reports that she has been having some nasal congestion and nasal mucus. Then developed drainage down her throat. Now is having a lot of chest congestion with a very congested cough. Only able to produce phlegm occasionally. Other times cannot get it to come out. Does have a low-grade fever of 99.3 at the visit. she has had no known documented fevers at home. Her throat has been a little scratchy and irritated but has had no significant sore throat. No ear ache.     Home Meds: See attached medication section for any medications that were entered at today's visit. The computer does not put those onto this list.The following list is a list of meds entered prior to today's visit.   No current outpatient prescriptions on file prior to visit.   No current facility-administered medications on file prior to visit.    Allergies:  Allergies  Allergen Reactions  . Lisinopril Cough      Review of Systems: See HPI for pertinent ROS. All other ROS negative.    Physical Exam: Blood pressure 124/80, pulse 88, temperature 99.3 F (37.4 C), temperature source Oral, resp. rate 18, weight 197 lb (89.359 kg)., Body mass index is 34.91 kg/(m^2). General:  WNWD WF. Appears in no acute distress. HEENT: Normocephalic, atraumatic, eyes without discharge, sclera non-icteric, nares are without discharge. Bilateral auditory canals clear, TM's are without perforation, pearly grey and translucent with reflective cone of light bilaterally. Oral cavity moist, posterior pharynx without exudate, erythema, peritonsillar abscess. Minimal tenderness with percussion of maxillary sinuses. No tenderness with percussion of frontal sinuses.  Neck: Supple. No  thyromegaly. No lymphadenopathy. Lungs: Clear bilaterally to auscultation without wheezes, rales, or rhonchi. Breathing is unlabored. Lungs are clear but she does cough during the visit and it sounds extremely congested. Heart: Regular rhythm. No murmurs, rubs, or gallops. Msk:  Strength and tone normal for age. Extremities/Skin: Warm and dry. Neuro: Alert and oriented X 3. Moves all extremities spontaneously. Gait is normal. CNII-XII grossly in tact. Psych:  Responds to questions appropriately with a normal affect.     ASSESSMENT AND PLAN:  48 y.o. year old female with  1. Bacterial respiratory infection - azithromycin (ZITHROMAX) 250 MG tablet; Day 1: Take 2 daily.  Days 2-5: Take 1 daily.  Dispense: 6 tablet; Refill: 0 Complete antibiotic as directed. Recommend using Mucinex DM as an expectorant. Drink lots of fluids to help clear her secretions/phlegm. Follow up if symptoms do not resolve within one week after completion of antibiotic. She reports that she did have to miss work yesterday. Will give her a note for out of work to cover this.  Signed, [address] [place], [place], [facility] 05/31/2013 8:52 AM

## 2013-07-17 ENCOUNTER — Ambulatory Visit: Payer: 59 | Admitting: Family Medicine

## 2013-08-18 SURGERY — EGD (ESOPHAGOGASTRODUODENOSCOPY)
Anesthesia: Moderate Sedation

## 2013-08-23 ENCOUNTER — Ambulatory Visit (INDEPENDENT_AMBULATORY_CARE_PROVIDER_SITE_OTHER): Payer: 59 | Admitting: Obstetrics & Gynecology

## 2013-08-23 ENCOUNTER — Other Ambulatory Visit (HOSPITAL_COMMUNITY)
Admission: RE | Admit: 2013-08-23 | Discharge: 2013-08-23 | Disposition: A | Discharge status: Home / Self Care | Payer: 59 | Source: Ambulatory Visit | Attending: Obstetrics & Gynecology | Admitting: Obstetrics & Gynecology

## 2013-08-23 ENCOUNTER — Encounter: Payer: Self-pay | Admitting: Obstetrics & Gynecology

## 2013-08-23 VITALS — BP 120/80 | Ht 63.0 in | Wt 198.0 lb

## 2013-08-23 DIAGNOSIS — Z1212 Encounter for screening for malignant neoplasm of rectum: Secondary | ICD-10-CM

## 2013-08-23 DIAGNOSIS — Z01419 Encounter for gynecological examination (general) (routine) without abnormal findings: Secondary | ICD-10-CM | POA: Insufficient documentation

## 2013-08-23 DIAGNOSIS — N920 Excessive and frequent menstruation with regular cycle: Secondary | ICD-10-CM

## 2013-08-23 MED ORDER — MEGESTROL ACETATE 40 MG PO TABS: 40.0000 mg | ORAL_TABLET | Freq: Every day | ORAL | Status: DC

## 2013-08-23 NOTE — Addendum Note (Signed)
Addended by: Doyne Keel on: 08/23/2013 11:49 AM   Modules accepted: Orders

## 2013-08-23 NOTE — Progress Notes (Signed)
Patient ID: [name], female   DOB: 1965-11-23, 48 y.o.   MRN: [id number] Subjective:     [name] is a 48 y.o. female here for a routine exam.  Patient's last menstrual period was 08/12/2013. No obstetric history on file. Birth Control Method:  Lap BTL Menstrual Calendar(currently): getting heavy for past 6 months  Current complaints: periods.   Current acute medical issues:  none   Recent Gynecologic History Patient's last menstrual period was 08/12/2013. Last Pap: 2014,  normal Last mammogram: 03/2013,  Core bi0psy negative  Past Medical History  Diagnosis Date  . Dysphagia   . Chronic gastritis   . Esophageal dilatation   . Duodenitis 08/2005  . Depression     Past Surgical History  Procedure Laterality Date  . Breast mass excision      bilateral tumor removal  . Tubal ligation    . Esophageal dilation  08/2005  . Wisdom tooth extraction    . Breast biopsy Right 04/26/2013    Procedure: BREAST BIOPSY TIMES TWO;  Surgeon: Jamesetta So, MD;  Location: [facility];  Service: General;  Laterality: Right;    OB History   Grav Para Term Preterm Abortions TAB SAB Ect Mult Living                  History   Social History  . Marital Status: Married    Spouse Name: N/A    Number of Children: N/A  . Years of Education: N/A   Occupational History  . Full time    Social History Main Topics  . Smoking status: Never Smoker   . Smokeless tobacco: None  . Alcohol Use: No  . Drug Use: No  . Sexual Activity: Not Currently   Other Topics Concern  . None   Social History Narrative   No regular exercise    Family History  Problem Relation Age of Onset  . Hypertension Father   . Cancer Paternal Aunt     colon  . Cancer Paternal Uncle     colon  . Cancer Cousin     colon     Review of Systems  Review of Systems  Constitutional: Negative for fever, chills, weight loss, malaise/fatigue and diaphoresis.  HENT: Negative for hearing loss, ear pain,  nosebleeds, congestion, sore throat, neck pain, tinnitus and ear discharge.   Eyes: Negative for blurred vision, double vision, photophobia, pain, discharge and redness.  Respiratory: Negative for cough, hemoptysis, sputum production, shortness of breath, wheezing and stridor.   Cardiovascular: Negative for chest pain, palpitations, orthopnea, claudication, leg swelling and PND.  Gastrointestinal: negative for abdominal pain. Negative for heartburn, nausea, vomiting, diarrhea, constipation, blood in stool and melena.  Genitourinary: Negative for dysuria, urgency, frequency, hematuria and flank pain.  Musculoskeletal: Negative for myalgias, back pain, joint pain and falls.  Skin: Negative for itching and rash.  Neurological: Negative for dizziness, tingling, tremors, sensory change, speech change, focal weakness, seizures, loss of consciousness, weakness and headaches.  Endo/Heme/Allergies: Negative for environmental allergies and polydipsia. Does not bruise/bleed easily.  Psychiatric/Behavioral: Negative for depression, suicidal ideas, hallucinations, memory loss and substance abuse. The patient is not nervous/anxious and does not have insomnia.        Objective:    Physical Exam  Vitals reviewed. Constitutional: She is oriented to person, place, and time. She appears well-developed and well-nourished.  HENT:  Head: Normocephalic and atraumatic.        Right Ear: External ear normal.  Left Ear: External ear normal.  Nose: Nose normal.  Mouth/Throat: Oropharynx is clear and moist.  Eyes: Conjunctivae and EOM are normal. Pupils are equal, round, and reactive to light. Right eye exhibits no discharge. Left eye exhibits no discharge. No scleral icterus.  Neck: Normal range of motion. Neck supple. No tracheal deviation present. No thyromegaly present.  Cardiovascular: Normal rate, regular rhythm, normal heart sounds and intact distal pulses.  Exam reveals no gallop and no friction rub.   No  murmur heard. Respiratory: Effort normal and breath sounds normal. No respiratory distress. She has no wheezes. She has no rales. She exhibits no tenderness.  GI: Soft. Bowel sounds are normal. She exhibits no distension and no mass. There is no tenderness. There is no rebound and no guarding.  Genitourinary:  Breasts no masses skin changes or nipple changes bilaterally      Vulva is normal without lesions Vagina is pink moist without discharge Cervix normal in appearance and pap is done Uterus is normal size shape and contour, maybe a little generous in size Adnexa is negative with normal sized ovaries   Musculoskeletal: Normal range of motion. She exhibits no edema and no tenderness.  Neurological: She is alert and oriented to person, place, and time. She has normal reflexes. She displays normal reflexes. No cranial nerve deficit. She exhibits normal muscle tone. Coordination normal.  Skin: Skin is warm and dry. No rash noted. No erythema. No pallor.  Psychiatric: She has a normal mood and affect. Her behavior is normal. Judgment and thought content normal.       Assessment:    Healthy female exam.    Plan:    Follow up in: 1 month. megestrol 40 mg daily

## 2013-08-27 ENCOUNTER — Encounter: Payer: Self-pay | Admitting: Obstetrics & Gynecology

## 2013-08-27 LAB — CYTOLOGY - PAP

## 2013-09-05 ENCOUNTER — Encounter: Payer: Self-pay | Admitting: Obstetrics & Gynecology

## 2013-09-09 ENCOUNTER — Encounter: Payer: Self-pay | Admitting: Obstetrics & Gynecology

## 2013-09-23 ENCOUNTER — Ambulatory Visit (INDEPENDENT_AMBULATORY_CARE_PROVIDER_SITE_OTHER): Payer: 59 | Admitting: Obstetrics & Gynecology

## 2013-09-23 ENCOUNTER — Encounter: Payer: Self-pay | Admitting: Obstetrics & Gynecology

## 2013-09-23 ENCOUNTER — Other Ambulatory Visit: Payer: Self-pay | Admitting: Obstetrics & Gynecology

## 2013-09-23 ENCOUNTER — Ambulatory Visit (INDEPENDENT_AMBULATORY_CARE_PROVIDER_SITE_OTHER): Payer: 59

## 2013-09-23 VITALS — BP 122/80 | Ht 63.0 in | Wt 185.0 lb

## 2013-09-23 DIAGNOSIS — N920 Excessive and frequent menstruation with regular cycle: Secondary | ICD-10-CM

## 2013-09-23 DIAGNOSIS — N946 Dysmenorrhea, unspecified: Secondary | ICD-10-CM

## 2013-09-23 DIAGNOSIS — D259 Leiomyoma of uterus, unspecified: Secondary | ICD-10-CM

## 2013-09-24 ENCOUNTER — Encounter (HOSPITAL_COMMUNITY): Payer: Self-pay | Admitting: Pharmacy Technician

## 2013-10-04 ENCOUNTER — Encounter (HOSPITAL_COMMUNITY): Payer: Self-pay

## 2013-10-04 ENCOUNTER — Encounter (HOSPITAL_COMMUNITY)
Admission: RE | Admit: 2013-10-04 | Discharge: 2013-10-04 | Disposition: A | Discharge status: Home / Self Care | Payer: 59 | Source: Ambulatory Visit | Attending: Obstetrics & Gynecology | Admitting: Obstetrics & Gynecology

## 2013-10-04 DIAGNOSIS — Z01818 Encounter for other preprocedural examination: Secondary | ICD-10-CM | POA: Insufficient documentation

## 2013-10-04 DIAGNOSIS — N920 Excessive and frequent menstruation with regular cycle: Secondary | ICD-10-CM | POA: Diagnosis not present

## 2013-10-04 DIAGNOSIS — N946 Dysmenorrhea, unspecified: Secondary | ICD-10-CM | POA: Insufficient documentation

## 2013-10-04 LAB — CBC
HEMATOCRIT: 38.5 % (ref 36.0–46.0)
Hemoglobin: 13.1 g/dL (ref 12.0–15.0)
MCH: 27.9 pg (ref 26.0–34.0)
MCHC: 34 g/dL (ref 30.0–36.0)
MCV: 81.9 fL (ref 78.0–100.0)
Platelets: 309 10*3/uL (ref 150–400)
RBC: 4.7 MIL/uL (ref 3.87–5.11)
RDW: 14.4 % (ref 11.5–15.5)
WBC: 6.7 10*3/uL (ref 4.0–10.5)

## 2013-10-04 LAB — TYPE AND SCREEN
ABO/RH(D): AB NEG
Antibody Screen: NEGATIVE

## 2013-10-04 LAB — COMPREHENSIVE METABOLIC PANEL
ALT: 30 U/L (ref 0–35)
ANION GAP: 15 (ref 5–15)
AST: 20 U/L (ref 0–37)
Albumin: 4.2 g/dL (ref 3.5–5.2)
Alkaline Phosphatase: 71 U/L (ref 39–117)
BILIRUBIN TOTAL: 0.4 mg/dL (ref 0.3–1.2)
BUN: 13 mg/dL (ref 6–23)
CHLORIDE: 107 meq/L (ref 96–112)
CO2: 21 meq/L (ref 19–32)
Calcium: 9.5 mg/dL (ref 8.4–10.5)
Creatinine, Ser: 0.75 mg/dL (ref 0.50–1.10)
GFR calc Af Amer: >90 mL/min (ref >90)
Glucose, Bld: 104 mg/dL — ABNORMAL HIGH (ref 70–99)
POTASSIUM: 3.9 meq/L (ref 3.7–5.3)
Sodium: 143 mEq/L (ref 137–147)
Total Protein: 7.4 g/dL (ref 6.0–8.3)

## 2013-10-04 LAB — HCG, QUANTITATIVE, PREGNANCY: hCG, Beta Chain, Quant, S: <1 m[IU]/mL (ref <5)

## 2013-10-04 NOTE — Patient Instructions (Signed)
[name]  10/04/2013   Your procedure is scheduled on:  10/11/13  Report to [facility] at 6:15 AM.  Call this number if you have problems the morning of surgery: [phone]   Remember:   Do not eat food or drink liquids after midnight.   Take these medicines the morning of surgery with A SIP OF WATER: Megace   Do not wear jewelry, make-up or nail polish.  Do not wear lotions, powders, or perfumes. You may wear deodorant.  Do not shave 48 hours prior to surgery. Men may shave face and neck.  Do not bring valuables to the hospital.  [facility] is not responsible for any belongings or valuables.               Contacts, dentures or bridgework may not be worn into surgery.  Leave suitcase in the car. After surgery it may be brought to your room.  For patients admitted to the hospital, discharge time is determined by your treatment team.               Patients discharged the day of surgery will not be allowed to drive home.  Name and phone number of your driver:   Special Instructions: Shower using CHG 2 nights before surgery and the night before surgery.  If you shower the day of surgery use CHG.  Use special wash - you have one bottle of CHG for all showers.  You should use approximately 1/3 of the bottle for each shower.   Please read over the following fact sheets that you were given: Surgical Site Infection Prevention and Anesthesia Post-op Instructions   PATIENT INSTRUCTIONS POST-ANESTHESIA  IMMEDIATELY FOLLOWING SURGERY:  Do not drive or operate machinery for the first twenty four hours after surgery.  Do not make any important decisions for twenty four hours after surgery or while taking narcotic pain medications or sedatives.  If you develop intractable nausea and vomiting or a severe headache please notify your doctor immediately.  FOLLOW-UP:  Please make an appointment with your surgeon as instructed. You do not need to follow up with anesthesia unless specifically  instructed to do so.  WOUND CARE INSTRUCTIONS (if applicable):  Keep a dry clean dressing on the anesthesia/puncture wound site if there is drainage.  Once the wound has quit draining you may leave it open to air.  Generally you should leave the bandage intact for twenty four hours unless there is drainage.  If the epidural site drains for more than 36-48 hours please call the anesthesia department.  QUESTIONS?:  Please feel free to call your physician or the hospital operator if you have any questions, and they will be happy to assist you.      Endometrial Ablation Endometrial ablation removes the lining of the uterus (endometrium). It is usually a same-day, outpatient treatment. Ablation helps avoid major surgery, such as surgery to remove the cervix and uterus (hysterectomy). After endometrial ablation, you will have little or no menstrual bleeding and may not be able to have children. However, if you are premenopausal, you will need to use a reliable method of birth control following the procedure because of the small chance that pregnancy can occur. There are different reasons to have this procedure, which include:  Heavy periods.  Bleeding that is causing anemia.  Irregular bleeding.  Bleeding fibroids on the lining inside the uterus if they are smaller than 3 centimeters. This procedure should not be done if:  You want children  in the future.  You have severe cramps with your menstrual period.  You have precancerous or cancerous cells in your uterus.  You were recently pregnant.  You have gone through menopause.  You have had major surgery on the uterus, such as a cesarean delivery. LET [facility] CARE PROVIDER KNOW ABOUT:  Any allergies you have.  All medicines you are taking, including vitamins, herbs, eye drops, creams, and over-the-counter medicines.  Previous problems you or members of your family have had with the use of anesthetics.  Any blood disorders you  have.  Previous surgeries you have had.  Medical conditions you have. RISKS AND COMPLICATIONS  Generally, this is a safe procedure. However, as with any procedure, complications can occur. Possible complications include:  Perforation of the uterus.  Bleeding.  Infection of the uterus, bladder, or vagina.  Injury to surrounding organs.  An air bubble to the lung (air embolus).  Pregnancy following the procedure.  Failure of the procedure to help the problem, requiring hysterectomy.  Decreased ability to diagnose cancer in the lining of the uterus. BEFORE THE PROCEDURE  The lining of the uterus must be tested to make sure there is no pre-cancerous or cancer cells present.  An ultrasound may be performed to look at the size of the uterus and to check for abnormalities.  Medicines may be given to thin the lining of the uterus. PROCEDURE  During the procedure, your health care provider will use a tool called a resectoscope to help see inside your uterus. There are different ways to remove the lining of your uterus.   Radiofrequency - This method uses a radiofrequency-alternating electric current to remove the lining of the uterus.  Cryotherapy - This method uses extreme cold to freeze the lining of the uterus.  Heated-Free Liquid - This method uses heated salt (saline) solution to remove the lining of the uterus.  Microwave - This method uses high-energy microwaves to heat up the lining of the uterus to remove it.  Thermal balloon - This method involves inserting a catheter with a balloon tip into the uterus. The balloon tip is filled with heated fluid to remove the lining of the uterus. AFTER THE PROCEDURE  After your procedure, do not have sexual intercourse or insert anything into your vagina until permitted by your health care provider. After the procedure, you may experience:  Cramps.  Vaginal discharge.  Frequent urination. Document Released: 11/27/2003 Document  Revised: 09/19/2012 Document Reviewed: 06/20/2012 [facility] Patient Information 2015 [place], [place]. This information is not intended to replace advice given to you by your health care provider. Make sure you discuss any questions you have with your health care provider. Dilation and Curettage or Vacuum Curettage Dilation and curettage (D&C) and vacuum curettage are minor procedures. A D&C involves stretching (dilation) the cervix and scraping (curettage) the inside lining of the womb (uterus). During a D&C, tissue is gently scraped from the inside lining of the uterus. During a vacuum curettage, the lining and tissue in the uterus are removed with the use of gentle suction.  Curettage may be performed to either diagnose or treat a problem. As a diagnostic procedure, curettage is performed to examine tissues from the uterus. A diagnostic curettage may be performed for the following symptoms:   Irregular bleeding in the uterus.   Bleeding with the development of clots.   Spotting between menstrual periods.   Prolonged menstrual periods.   Bleeding after menopause.   No menstrual period (amenorrhea).   A  change in size and shape of the uterus.  As a treatment procedure, curettage may be performed for the following reasons:   Removal of an IUD (intrauterine device).   Removal of retained placenta after giving birth. Retained placenta can cause an infection or bleeding severe enough to require transfusions.   Abortion.   Miscarriage.   Removal of polyps inside the uterus.   Removal of uncommon types of noncancerous lumps (fibroids).  LET [facility] CARE PROVIDER KNOW ABOUT:   Any allergies you have.   All medicines you are taking, including vitamins, herbs, eye drops, creams, and over-the-counter medicines.   Previous problems you or members of your family have had with the use of anesthetics.   Any blood disorders you have.   Previous surgeries you have had.    Medical conditions you have. RISKS AND COMPLICATIONS  Generally, this is a safe procedure. However, as with any procedure, complications can occur. Possible complications include:  Excessive bleeding.   Infection of the uterus.   Damage to the cervix.   Development of scar tissue (adhesions) inside the uterus, later causing abnormal amounts of menstrual bleeding.   Complications from the general anesthetic, if a general anesthetic is used.   Putting a hole (perforation) in the uterus. This is rare.  BEFORE THE PROCEDURE   Eat and drink before the procedure only as directed by your health care provider.   Arrange for someone to take you home.  PROCEDURE  This procedure usually takes about 15-30 minutes.  You will be given one of the following:  A medicine that numbs the area in and around the cervix (local anesthetic).   A medicine to make you sleep through the procedure (general anesthetic).  You will lie on your back with your legs in stirrups.   A warm metal or plastic instrument (speculum) will be placed in your vagina to keep it open and to allow the health care provider to see the cervix.  There are two ways in which your cervix can be softened and dilated. These include:   Taking a medicine.   Having thin rods (laminaria) inserted into your cervix.   A curved tool (curette) will be used to scrape cells from the inside lining of the uterus. In some cases, gentle suction is applied with the curette. The curette will then be removed.  AFTER THE PROCEDURE   You will rest in the recovery area until you are stable and are ready to go home.   You may feel sick to your stomach (nauseous) or throw up (vomit) if you were given a general anesthetic.   You may have a sore throat if a tube was placed in your throat during general anesthesia.   You may have light cramping and bleeding. This may last for 2 days to 2 weeks after the procedure.   Your  uterus needs to make a new lining after the procedure. This may make your next period late. Document Released: 01/17/2005 Document Revised: 09/19/2012 Document Reviewed: 08/16/2012 [facility] Patient Information 2015 [place], [place]. This information is not intended to replace advice given to you by your health care provider. Make sure you discuss any questions you have with your health care provider.

## 2013-10-11 ENCOUNTER — Encounter (HOSPITAL_COMMUNITY): Payer: 59 | Admitting: Anesthesiology

## 2013-10-11 ENCOUNTER — Ambulatory Visit (HOSPITAL_COMMUNITY): Payer: 59 | Admitting: Anesthesiology

## 2013-10-11 ENCOUNTER — Ambulatory Visit (HOSPITAL_COMMUNITY)
Admission: RE | Admit: 2013-10-11 | Discharge: 2013-10-11 | Disposition: A | Discharge status: Home / Self Care | Payer: 59 | Source: Ambulatory Visit | Attending: Obstetrics & Gynecology | Admitting: Obstetrics & Gynecology

## 2013-10-11 ENCOUNTER — Encounter (HOSPITAL_COMMUNITY): Payer: Self-pay

## 2013-10-11 ENCOUNTER — Encounter (HOSPITAL_COMMUNITY)
Admission: RE | Disposition: A | Discharge status: Home / Self Care | Payer: Self-pay | Source: Ambulatory Visit | Attending: Obstetrics & Gynecology

## 2013-10-11 DIAGNOSIS — N920 Excessive and frequent menstruation with regular cycle: Secondary | ICD-10-CM | POA: Diagnosis not present

## 2013-10-11 DIAGNOSIS — N946 Dysmenorrhea, unspecified: Secondary | ICD-10-CM | POA: Diagnosis not present

## 2013-10-11 DIAGNOSIS — Z9889 Other specified postprocedural states: Secondary | ICD-10-CM

## 2013-10-11 HISTORY — PX: DILITATION & CURRETTAGE/HYSTROSCOPY WITH THERMACHOICE ABLATION: SHX5569

## 2013-10-11 LAB — URINE MICROSCOPIC-ADD ON

## 2013-10-11 LAB — URINALYSIS, ROUTINE W REFLEX MICROSCOPIC
Bilirubin Urine: NEGATIVE
GLUCOSE, UA: NEGATIVE mg/dL
NITRITE: NEGATIVE
Urobilinogen, UA: 0.2 mg/dL (ref 0.0–1.0)
pH: 5 (ref 5.0–8.0)

## 2013-10-11 SURGERY — DILATATION & CURETTAGE/HYSTEROSCOPY WITH THERMACHOICE ABLATION
Anesthesia: General | Site: Uterus

## 2013-10-11 MED ORDER — KETOROLAC TROMETHAMINE 30 MG/ML IJ SOLN
30.0000 mg | Freq: Once | INTRAMUSCULAR | Status: AC
  Administered 2013-10-11: 30 mg via INTRAVENOUS
  Filled 2013-10-11: qty 1

## 2013-10-11 MED ORDER — FENTANYL CITRATE 0.05 MG/ML IJ SOLN
INTRAMUSCULAR | Status: AC
  Filled 2013-10-11: qty 2

## 2013-10-11 MED ORDER — ONDANSETRON HCL 4 MG/2ML IJ SOLN
INTRAMUSCULAR | Status: AC
  Filled 2013-10-11: qty 2

## 2013-10-11 MED ORDER — ONDANSETRON HCL 4 MG/2ML IJ SOLN: 4.0000 mg | Freq: Once | INTRAMUSCULAR | Status: DC

## 2013-10-11 MED ORDER — MIDAZOLAM HCL 5 MG/5ML IJ SOLN
INTRAMUSCULAR | Status: DC | PRN
  Administered 2013-10-11: 2 mg via INTRAVENOUS

## 2013-10-11 MED ORDER — HYDROCODONE-ACETAMINOPHEN 5-325 MG PO TABS: 1.0000 | ORAL_TABLET | Freq: Four times a day (QID) | ORAL | Status: DC | PRN

## 2013-10-11 MED ORDER — FENTANYL CITRATE 0.05 MG/ML IJ SOLN
25.0000 ug | INTRAMUSCULAR | Status: AC
  Administered 2013-10-11: 25 ug via INTRAVENOUS

## 2013-10-11 MED ORDER — LIDOCAINE HCL 1 % IJ SOLN
INTRAMUSCULAR | Status: DC | PRN
  Administered 2013-10-11: 50 mg via INTRADERMAL

## 2013-10-11 MED ORDER — SODIUM CHLORIDE 0.9 % IR SOLN
Status: DC | PRN
  Administered 2013-10-11: 3000 mL
  Administered 2013-10-11: 1000 mL

## 2013-10-11 MED ORDER — DEXAMETHASONE SODIUM PHOSPHATE 4 MG/ML IJ SOLN
INTRAMUSCULAR | Status: AC
  Filled 2013-10-11: qty 1

## 2013-10-11 MED ORDER — MIDAZOLAM HCL 2 MG/2ML IJ SOLN
INTRAMUSCULAR | Status: AC
  Filled 2013-10-11: qty 2

## 2013-10-11 MED ORDER — ONDANSETRON HCL 4 MG/2ML IJ SOLN: 4.0000 mg | Freq: Once | INTRAMUSCULAR | Status: DC | PRN

## 2013-10-11 MED ORDER — PROPOFOL 10 MG/ML IV BOLUS
INTRAVENOUS | Status: AC
  Filled 2013-10-11: qty 20

## 2013-10-11 MED ORDER — DEXTROSE 5 % IV SOLN
INTRAVENOUS | Status: DC | PRN
  Administered 2013-10-11: 500 mL

## 2013-10-11 MED ORDER — ONDANSETRON HCL 8 MG PO TABS: 8.0000 mg | ORAL_TABLET | Freq: Three times a day (TID) | ORAL | Status: DC | PRN

## 2013-10-11 MED ORDER — FENTANYL CITRATE 0.05 MG/ML IJ SOLN
25.0000 ug | INTRAMUSCULAR | Status: DC | PRN
  Administered 2013-10-11 (×2): 25 ug via INTRAVENOUS
  Filled 2013-10-11: qty 2

## 2013-10-11 MED ORDER — FENTANYL CITRATE 0.05 MG/ML IJ SOLN
INTRAMUSCULAR | Status: DC | PRN
Start: 2013-10-11 — End: 2013-10-11
  Administered 2013-10-11: 25 ug via INTRAVENOUS
  Administered 2013-10-11: 50 ug via INTRAVENOUS
  Administered 2013-10-11: 25 ug via INTRAVENOUS

## 2013-10-11 MED ORDER — DEXAMETHASONE SODIUM PHOSPHATE 4 MG/ML IJ SOLN
4.0000 mg | Freq: Once | INTRAMUSCULAR | Status: AC
  Administered 2013-10-11: 4 mg via INTRAVENOUS

## 2013-10-11 MED ORDER — KETOROLAC TROMETHAMINE 10 MG PO TABS: 10.0000 mg | ORAL_TABLET | Freq: Three times a day (TID) | ORAL | Status: DC | PRN

## 2013-10-11 MED ORDER — PROPOFOL 10 MG/ML IV BOLUS
INTRAVENOUS | Status: DC | PRN
  Administered 2013-10-11: 150 mg via INTRAVENOUS

## 2013-10-11 MED ORDER — MIDAZOLAM HCL 2 MG/2ML IJ SOLN
1.0000 mg | INTRAMUSCULAR | Status: DC | PRN
  Administered 2013-10-11: 2 mg via INTRAVENOUS

## 2013-10-11 MED ORDER — LACTATED RINGERS IV SOLN
INTRAVENOUS | Status: DC
  Administered 2013-10-11: 07:00:00 via INTRAVENOUS

## 2013-10-11 MED ORDER — LIDOCAINE HCL (PF) 1 % IJ SOLN
INTRAMUSCULAR | Status: AC
  Filled 2013-10-11: qty 5

## 2013-10-11 MED ORDER — CEFAZOLIN SODIUM-DEXTROSE 2-3 GM-% IV SOLR
2.0000 g | INTRAVENOUS | Status: AC
  Administered 2013-10-11: 2 g via INTRAVENOUS
  Filled 2013-10-11: qty 50

## 2013-10-11 SURGICAL SUPPLY — 32 items
BAG DECANTER FOR FLEXI CONT (MISCELLANEOUS) ×2 IMPLANT
BAG HAMPER (MISCELLANEOUS) ×2 IMPLANT
CATH THERMACHOICE III (CATHETERS) ×2 IMPLANT
CLOTH BEACON ORANGE TIMEOUT ST (SAFETY) ×2 IMPLANT
COVER LIGHT HANDLE STERIS (MISCELLANEOUS) ×4 IMPLANT
COVER MAYO STAND XLG (DRAPE) ×2 IMPLANT
FORMALIN 10 PREFIL 120ML (MISCELLANEOUS) ×2 IMPLANT
GAUZE SPONGE 4X4 16PLY XRAY LF (GAUZE/BANDAGES/DRESSINGS) ×2 IMPLANT
GLOVE BIOGEL PI IND STRL 7.0 (GLOVE) IMPLANT
GLOVE BIOGEL PI IND STRL 8 (GLOVE) ×1 IMPLANT
GLOVE BIOGEL PI INDICATOR 7.0 (GLOVE) ×1
GLOVE BIOGEL PI INDICATOR 8 (GLOVE) ×1
GLOVE ECLIPSE 6.5 STRL STRAW (GLOVE) ×1 IMPLANT
GLOVE ECLIPSE 8.0 STRL XLNG CF (GLOVE) ×2 IMPLANT
GLOVE EXAM NITRILE MD LF STRL (GLOVE) ×1 IMPLANT
GOWN STRL REUS W/TWL LRG LVL3 (GOWN DISPOSABLE) ×2 IMPLANT
GOWN STRL REUS W/TWL XL LVL3 (GOWN DISPOSABLE) ×2 IMPLANT
INST SET HYSTEROSCOPY (KITS) ×2 IMPLANT
IV D5W 500ML (IV SOLUTION) ×2 IMPLANT
IV NS IRRIG 3000ML ARTHROMATIC (IV SOLUTION) ×1 IMPLANT
KIT ROOM TURNOVER AP CYSTO (KITS) ×2 IMPLANT
MANIFOLD NEPTUNE II (INSTRUMENTS) ×2 IMPLANT
MARKER SKIN DUAL TIP RULER LAB (MISCELLANEOUS) ×2 IMPLANT
NS IRRIG 1000ML POUR BTL (IV SOLUTION) ×2 IMPLANT
PACK BASIC III (CUSTOM PROCEDURE TRAY) ×2
PACK SRG BSC III STRL LF ECLPS (CUSTOM PROCEDURE TRAY) ×1 IMPLANT
PAD ARMBOARD 7.5X6 YLW CONV (MISCELLANEOUS) ×2 IMPLANT
PAD TELFA 3X4 1S STER (GAUZE/BANDAGES/DRESSINGS) ×2 IMPLANT
SET BASIN LINEN APH (SET/KITS/TRAYS/PACK) ×2 IMPLANT
SET IRRIG Y TYPE TUR BLADDER L (SET/KITS/TRAYS/PACK) ×2 IMPLANT
SHEET LAVH (DRAPES) ×2 IMPLANT
YANKAUER SUCT BULB TIP 10FT TU (MISCELLANEOUS) ×2 IMPLANT

## 2013-10-11 NOTE — Transfer of Care (Signed)
Immediate Anesthesia Transfer of Care Note  Patient: [name]  Procedure(s) Performed: Procedure(s) with comments: DILATATION & CURETTAGE/HYSTEROSCOPY WITH THERMACHOICE ABLATION (N/A) - total therapy time- 20 minutes temp  76 deg C  Patient Location: PACU  Anesthesia Type:General  Level of Consciousness: awake and patient cooperative  Airway & Oxygen Therapy: Patient Spontanous Breathing and Patient connected to face mask oxygen  Post-op Assessment: Report given to PACU RN, Post -op Vital signs reviewed and stable and Patient moving all extremities  Post vital signs: Reviewed and stable  Complications: No apparent anesthesia complications

## 2013-10-11 NOTE — Discharge Instructions (Signed)
Endometrial Ablation Endometrial ablation removes the lining of the uterus (endometrium). It is usually a same-day, outpatient treatment. Ablation helps avoid major surgery, such as surgery to remove the cervix and uterus (hysterectomy). After endometrial ablation, you will have little or no menstrual bleeding and may not be able to have children. However, if you are premenopausal, you will need to use a reliable method of birth control following the procedure because of the small chance that pregnancy can occur. There are different reasons to have this procedure, which include:  Heavy periods.  Bleeding that is causing anemia.  Irregular bleeding.  Bleeding fibroids on the lining inside the uterus if they are smaller than 3 centimeters. This procedure should not be done if:  You want children in the future.  You have severe cramps with your menstrual period.  You have precancerous or cancerous cells in your uterus.  You were recently pregnant.  You have gone through menopause.  You have had major surgery on the uterus, such as a cesarean delivery. LET YOUR HEALTH CARE PROVIDER KNOW ABOUT:  Any allergies you have.  All medicines you are taking, including vitamins, herbs, eye drops, creams, and over-the-counter medicines.  Previous problems you or members of your family have had with the use of anesthetics.  Any blood disorders you have.  Previous surgeries you have had.  Medical conditions you have. RISKS AND COMPLICATIONS  Generally, this is a safe procedure. However, as with any procedure, complications can occur. Possible complications include:  Perforation of the uterus.  Bleeding.  Infection of the uterus, bladder, or vagina.  Injury to surrounding organs.  An air bubble to the lung (air embolus).  Pregnancy following the procedure.  Failure of the procedure to help the problem, requiring hysterectomy.  Decreased ability to diagnose cancer in the lining of  the uterus. BEFORE THE PROCEDURE  The lining of the uterus must be tested to make sure there is no pre-cancerous or cancer cells present.  An ultrasound may be performed to look at the size of the uterus and to check for abnormalities.  Medicines may be given to thin the lining of the uterus. PROCEDURE  During the procedure, your health care provider will use a tool called a resectoscope to help see inside your uterus. There are different ways to remove the lining of your uterus.   Radiofrequency - This method uses a radiofrequency-alternating electric current to remove the lining of the uterus.  Cryotherapy - This method uses extreme cold to freeze the lining of the uterus.  Heated-Free Liquid - This method uses heated salt (saline) solution to remove the lining of the uterus.  Microwave - This method uses high-energy microwaves to heat up the lining of the uterus to remove it.  Thermal balloon - This method involves inserting a catheter with a balloon tip into the uterus. The balloon tip is filled with heated fluid to remove the lining of the uterus. AFTER THE PROCEDURE  After your procedure, do not have sexual intercourse or insert anything into your vagina until permitted by your health care provider. After the procedure, you may experience:  Cramps.  Vaginal discharge.  Frequent urination. Document Released: 11/27/2003 Document Revised: 09/19/2012 Document Reviewed: 06/20/2012 [employer] Patient Information 2015 [employer]. This information is not intended to replace advice given to you by your health care provider. Make sure you discuss any questions you have with your health care provider.  

## 2013-10-11 NOTE — Anesthesia Procedure Notes (Signed)
Procedure Name: LMA Insertion Date/Time: 10/11/2013 7:40 AM Performed by: Charmaine Downs Pre-anesthesia Checklist: Emergency Drugs available, Patient identified, Suction available and Patient being monitored Patient Re-evaluated:Patient Re-evaluated prior to inductionOxygen Delivery Method: Circle system utilized Preoxygenation: Pre-oxygenation with 100% oxygen Intubation Type: IV induction Ventilation: Mask ventilation without difficulty LMA: LMA inserted LMA Size: 3.0 Tube type: Oral Number of attempts: 1 Placement Confirmation: positive ETCO2 and breath sounds checked- equal and bilateral Tube secured with: Tape Dental Injury: Teeth and Oropharynx as per pre-operative assessment

## 2013-10-11 NOTE — Anesthesia Postprocedure Evaluation (Signed)
  Anesthesia Post-op Note  Patient: [name]  Procedure(s) Performed: Procedure(s) with comments: DILATATION & CURETTAGE/HYSTEROSCOPY WITH THERMACHOICE ABLATION (N/A) - total therapy time- 20 minutes temp  76 deg C  Patient Location: PACU  Anesthesia Type:General  Level of Consciousness: awake, alert , oriented and patient cooperative  Airway and Oxygen Therapy: Patient Spontanous Breathing  Post-op Pain: none  Post-op Assessment: Post-op Vital signs reviewed, Patient's Cardiovascular Status Stable, Respiratory Function Stable, Patent Airway and Pain level controlled  Post-op Vital Signs: Reviewed and stable  Last Vitals:  Filed Vitals:   10/11/13 0725  BP: 120/77  Pulse:   Temp:   Resp: 17    Complications: No apparent anesthesia complications

## 2013-10-11 NOTE — H&P (Signed)
Preoperative History and Physical  [name] is a 48 y.o. No obstetric history on file. with Patient's last menstrual period was 09/10/2013. admitted for a hysteroscopy uterine curettage endometrial ablation for increasing heavier and painful periods and a non response to megace.  Sonogram reveals small myomas not significant for the endometrium  PMH:    Past Medical History  Diagnosis Date  . Dysphagia   . Chronic gastritis   . Esophageal dilatation   . Duodenitis 08/2005  . Depression     PSH:     Past Surgical History  Procedure Laterality Date  . Breast mass excision      bilateral tumor removal  . Tubal ligation    . Esophageal dilation  08/2005  . Wisdom tooth extraction    . Breast biopsy Right 04/26/2013    Procedure: BREAST BIOPSY TIMES TWO;  Surgeon: Jamesetta So, MD;  Location: [facility];  Service: General;  Laterality: Right;    POb/GynH:      OB History   Grav Para Term Preterm Abortions TAB SAB Ect Mult Living                  SH:   History  Substance Use Topics  . Smoking status: Never Smoker   . Smokeless tobacco: Never Used  . Alcohol Use: No    FH:    Family History  Problem Relation Age of Onset  . Hypertension Father   . Cancer Paternal Aunt     colon  . Cancer Paternal Uncle     colon  . Cancer Cousin     colon     Allergies:  Allergies  Allergen Reactions  . Lisinopril Cough    Medications:      Current facility-administered medications:ceFAZolin (ANCEF) IVPB 2 g/50 mL premix, 2 g, Intravenous, On Call to OR, Florian Buff, MD  Review of Systems:   Review of Systems  Constitutional: Negative for fever, chills, weight loss, malaise/fatigue and diaphoresis.  HENT: Negative for hearing loss, ear pain, nosebleeds, congestion, sore throat, neck pain, tinnitus and ear discharge.   Eyes: Negative for blurred vision, double vision, photophobia, pain, discharge and redness.  Respiratory: Negative for cough, hemoptysis, sputum  production, shortness of breath, wheezing and stridor.   Cardiovascular: Negative for chest pain, palpitations, orthopnea, claudication, leg swelling and PND.  Gastrointestinal: Positive for abdominal pain. Negative for heartburn, nausea, vomiting, diarrhea, constipation, blood in stool and melena.  Genitourinary: Negative for dysuria, urgency, frequency, hematuria and flank pain.  Musculoskeletal: Negative for myalgias, back pain, joint pain and falls.  Skin: Negative for itching and rash.  Neurological: Negative for dizziness, tingling, tremors, sensory change, speech change, focal weakness, seizures, loss of consciousness, weakness and headaches.  Endo/Heme/Allergies: Negative for environmental allergies and polydipsia. Does not bruise/bleed easily.  Psychiatric/Behavioral: Negative for depression, suicidal ideas, hallucinations, memory loss and substance abuse. The patient is not nervous/anxious and does not have insomnia.      PHYSICAL EXAM:  Blood pressure 126/80, pulse 81, temperature 98.2 F (36.8 C), temperature source Oral, resp. rate 20, height 5\' 3"  (1.6 m), weight 182 lb (82.555 kg), last menstrual period 09/10/2013, SpO2 97.00%.    Vitals reviewed. Constitutional: She is oriented to person, place, and time. She appears well-developed and well-nourished.  HENT:  Head: Normocephalic and atraumatic.  Right Ear: External ear normal.  Left Ear: External ear normal.  Nose: Nose normal.  Mouth/Throat: Oropharynx is clear and moist.  Eyes: Conjunctivae and EOM are  normal. Pupils are equal, round, and reactive to light. Right eye exhibits no discharge. Left eye exhibits no discharge. No scleral icterus.  Neck: Normal range of motion. Neck supple. No tracheal deviation present. No thyromegaly present.  Cardiovascular: Normal rate, regular rhythm, normal heart sounds and intact distal pulses.  Exam reveals no gallop and no friction rub.   No murmur heard. Respiratory: Effort normal  and breath sounds normal. No respiratory distress. She has no wheezes. She has no rales. She exhibits no tenderness.  GI: Soft. Bowel sounds are normal. She exhibits no distension and no mass. There is tenderness. There is no rebound and no guarding.  Genitourinary:       Vulva is normal without lesions Vagina is pink moist without discharge Cervix normal in appearance and pap is normal Uterus is normal size with small fibroids Adnexa is negative with normal sized ovaries by sonogram  Musculoskeletal: Normal range of motion. She exhibits no edema and no tenderness.  Neurological: She is alert and oriented to person, place, and time. She has normal reflexes. She displays normal reflexes. No cranial nerve deficit. She exhibits normal muscle tone. Coordination normal.  Skin: Skin is warm and dry. No rash noted. No erythema. No pallor.  Psychiatric: She has a normal mood and affect. Her behavior is normal. Judgment and thought content normal.    Labs: Results for orders placed during the hospital encounter of 10/04/13 (from the past 336 hour(s))  CBC   Collection Time    10/04/13  1:30 PM      Result Value Ref Range   WBC 6.7  4.0 - 10.5 K/uL   RBC 4.70  3.87 - 5.11 MIL/uL   Hemoglobin 13.1  12.0 - 15.0 g/dL   HCT 38.5  36.0 - 46.0 %   MCV 81.9  78.0 - 100.0 fL   MCH 27.9  26.0 - 34.0 pg   MCHC 34.0  30.0 - 36.0 g/dL   RDW 14.4  11.5 - 15.5 %   Platelets 309  150 - 400 K/uL  COMPREHENSIVE METABOLIC PANEL   Collection Time    10/04/13  1:30 PM      Result Value Ref Range   Sodium 143  137 - 147 mEq/L   Potassium 3.9  3.7 - 5.3 mEq/L   Chloride 107  96 - 112 mEq/L   CO2 21  19 - 32 mEq/L   Glucose, Bld 104 (*) 70 - 99 mg/dL   BUN 13  6 - 23 mg/dL   Creatinine, Ser 0.75  0.50 - 1.10 mg/dL   Calcium 9.5  8.4 - 10.5 mg/dL   Total Protein 7.4  6.0 - 8.3 g/dL   Albumin 4.2  3.5 - 5.2 g/dL   AST 20  0 - 37 U/L   ALT 30  0 - 35 U/L   Alkaline Phosphatase 71  39 - 117 U/L   Total  Bilirubin 0.4  0.3 - 1.2 mg/dL   GFR calc non Af Amer >90  >90 mL/min   GFR calc Af Amer >90  >90 mL/min   Anion gap 15  5 - 15  HCG, QUANTITATIVE, PREGNANCY   Collection Time    10/04/13  1:30 PM      Result Value Ref Range   hCG, Beta Chain, Quant, S <1  <5 mIU/mL  TYPE AND SCREEN   Collection Time    10/04/13  1:30 PM      Result Value Ref Range   ABO/RH(D)  AB NEG     Antibody Screen NEG     Sample Expiration 10/18/2013      EKG: Orders placed during the hospital encounter of 10/04/13  . EKG 12-LEAD  . EKG 12-LEAD  . EKG 12-LEAD  . EKG 12-LEAD    Imaging Studies: US Transvaginal Non-ob  10/29/2013   GYNECOLOGIC SONOGRAM   [name] is a 48 y.o.  for a pelvic sonogram for menorrhagia. Pt is  currently taking Megace with no relief from vaginal bleeding noted by  patient.  Uterus                      9.1 x 8.6 x 6.6 cm, with multiple fibroids  noted largest=3.0cm, retroverted   Endometrium          19.2 mm, asymmetrical, although no obvious mass noted  (pt actively bleeding)  Right ovary             2.7 x 1.9 x 1.6 cm,   Left ovary                3. x 01.9 x 1.8 cm,   No free fluid or adnexal masses noted within the pelvis  Technician Comments:  Retroverted uterus noted with multiple fibroids noted, Endometrium=19.27mm,  bilateral ovaries/adnexa appear WNL, no free fluid or adnexal masses noted  within the pelvis   Lazarus Gowda 09/23/2013 1:30 PM  Clinical Impression and recommendations:  I have reviewed the sonogram results above, combined with the patient's  current clinical course, below are my impressions and any appropriate  recommendations for management based on the sonographic findings.  Small myomas, not clinically relative Otherwise norma pelvic anatomy   [name] 10-29-2013 5:16 PM       Assessment: Patient Active Problem List   Diagnosis Date Noted  . Excessive or frequent menstruation 09/23/2013  . Leiomyoma of uterus, unspecified 09/23/2013  .  Dysmenorrhea 09/23/2013  . Mixed stress and urge urinary incontinence 07/16/2012  . Essential hypertension, benign 07/16/2012  . Obesity, unspecified 07/16/2012  . Other malaise and fatigue 07/16/2012  . Insomnia 07/16/2012  . Seasonal depression 07/16/2012  . ELECTROCARDIOGRAM, ABNORMAL 01/05/2010  . ESOPHAGEAL REFLUX 09/11/2007  . GASTRITIS, ANTRAL 09/11/2007  . HEADACHES, HX OF 09/11/2007    Plan: Hysteroscopy uterine curettage endometrial ablation  [name] 10/11/2013 7:11 AM

## 2013-10-11 NOTE — Op Note (Signed)
Preoperative diagnosis: Menometrorrhagia                                        Dysmenorrhea   Postoperative diagnoses: Same as above   Procedure: Hysteroscopy, uterine curettage, endometrial ablation  Surgeon: Elonda Husky MD  Anesthesia: Laryngeal mask airway  Findings: The endometrium was normal. There were no fibroid or other abnormalities.  Description of operation: The patient was taken to the operating room and placed in the supine position. She underwent general anesthesia using the laryngeal mask airway. She was placed in the dorsal lithotomy position and prepped and draped in the usual sterile fashion. A Graves speculum was placed and the anterior cervical lip was grasped with a single-tooth tenaculum. The cervix was dilated serially to allow passage of the hysteroscope. Diagnostic hysteroscopy was performed and was found to be normal. A vigorous uterine curettage was then performed and all tissue sent to pathology for evaluation. The ThermaChoice 3 endometrial ablation balloon was then used were 86 cc of D5W was required to maintain a pressure of 190-200 mm of mercury throughout the procedure. Toatl therapy time was 20:00.  All of the equipment worked well throughout the procedure. All of the fluid was returned at the end of the procedure. The patient was awakened from anesthesia and taken to the recovery room in good stable condition all counts were correct. She received 2 g of Ancef and 30 mg of Toradol preoperatively. She will be discharged from the recovery room and followed up in the office in 1- 2 weeks.  [name] H 10/11/2013 8:29 AM

## 2013-10-11 NOTE — Anesthesia Preprocedure Evaluation (Signed)
Anesthesia Evaluation  Patient identified by MRN, date of birth, ID band Patient awake    Reviewed: Allergy & Precautions, H&P , NPO status , Patient's Chart, lab work & pertinent test results  History of Anesthesia Complications (+) PONV and history of anesthetic complications  Airway Mallampati: III TM Distance: >3 FB Neck ROM: Full    Dental   Pulmonary  breath sounds clear to auscultation        Cardiovascular Rhythm:Regular Rate:Normal     Neuro/Psych PSYCHIATRIC DISORDERS Depression    GI/Hepatic Gastritis hx    Endo/Other    Renal/GU      Musculoskeletal   Abdominal   Peds  Hematology   Anesthesia Other Findings   Reproductive/Obstetrics                           Anesthesia Physical Anesthesia Plan  ASA: II  Anesthesia Plan: General   Post-op Pain Management:    Induction: Intravenous  Airway Management Planned: LMA  Additional Equipment:   Intra-op Plan:   Post-operative Plan: Extubation in OR  Informed Consent: I have reviewed the patients History and Physical, chart, labs and discussed the procedure including the risks, benefits and alternatives for the proposed anesthesia with the patient or authorized representative who has indicated his/her understanding and acceptance.     Plan Discussed with:   Anesthesia Plan Comments:         Anesthesia Quick Evaluation

## 2013-10-14 ENCOUNTER — Encounter (HOSPITAL_COMMUNITY): Payer: Self-pay | Admitting: Obstetrics & Gynecology

## 2013-10-18 ENCOUNTER — Encounter: Payer: 59 | Admitting: Obstetrics & Gynecology

## 2013-10-21 ENCOUNTER — Ambulatory Visit (INDEPENDENT_AMBULATORY_CARE_PROVIDER_SITE_OTHER): Payer: 59 | Admitting: Obstetrics & Gynecology

## 2013-10-21 ENCOUNTER — Encounter: Payer: Self-pay | Admitting: Obstetrics & Gynecology

## 2013-10-21 VITALS — BP 122/80 | Wt 178.0 lb

## 2013-10-21 DIAGNOSIS — Z9889 Other specified postprocedural states: Secondary | ICD-10-CM

## 2013-10-21 NOTE — Progress Notes (Signed)
Patient ID: [name], female   DOB: September 12, 1965, 48 y.o.   MRN: [id number] 10 days post op from hysteroscopy uterine curettage endometrial ablation  No complaints  Doing well No bleeindg  Minimal vaginal discharge  Exam NEFG Vagina  No blood Cervix normal Uterus normal non tender  Follow up 1 year or prn

## 2013-10-30 NOTE — Progress Notes (Signed)
Patient ID: [name], female   DOB: May 28, 1965, 48 y.o.   MRN: [id number] GYNECOLOGIC SONOGRAM  [name] is a 48 y.o. for a pelvic sonogram for menorrhagia. Pt is currently taking Megace with no relief from vaginal bleeding noted by patient.  Uterus 9.1 x 8.6 x 6.6 cm, with multiple fibroids noted largest=3.0cm, retroverted  Endometrium 19.2 mm, asymmetrical, although no obvious mass noted (pt actively bleeding)  Right ovary 2.7 x 1.9 x 1.6 cm,  Left ovary 3. x 01.9 x 1.8 cm,  No free fluid or adnexal masses noted within the pelvis  Technician Comments:  Retroverted uterus noted with multiple fibroids noted, Endometrium=19.1mm, bilateral ovaries/adnexa appear WNL, no free fluid or adnexal masses noted within the pelvis  [name]  09/23/2013  1:30 PM  Clinical Impression and recommendations:  I have reviewed the sonogram results above, combined with the patient's current clinical course, below are my impressions and any appropriate recommendations for management based on the sonographic findings.  Small myomas, not clinically relative  Otherwise norma pelvic anatomy  [name] H  09/29/2013  5:16 PM    Appropriate for endometrial ablation with distorting myomas Scheduled per patient  Past Medical History  Diagnosis Date  . Dysphagia   . Chronic gastritis   . Esophageal dilatation   . Duodenitis 08/2005  . Depression     Past Surgical History  Procedure Laterality Date  . Breast mass excision      bilateral tumor removal  . Tubal ligation    . Esophageal dilation  08/2005  . Wisdom tooth extraction    . Breast biopsy Right 04/26/2013    Procedure: BREAST BIOPSY TIMES TWO;  Surgeon: Jamesetta So, MD;  Location: [facility];  Service: General;  Laterality: Right;  . Dilitation & currettage/hystroscopy with thermachoice ablation N/A 10/11/2013    Procedure: DILATATION & CURETTAGE/HYSTEROSCOPY WITH THERMACHOICE ABLATION;  Surgeon: Florian Buff, MD;  Location: [facility];  Service: Gynecology;  Laterality: N/A;  total therapy time- 20 minutes temp  76 deg C    OB History   Grav Para Term Preterm Abortions TAB SAB Ect Mult Living                  Allergies  Allergen Reactions  . Lisinopril Cough    History   Social History  . Marital Status: Married    Spouse Name: N/A    Number of Children: N/A  . Years of Education: N/A   Occupational History  . Full time    Social History Main Topics  . Smoking status: Never Smoker   . Smokeless tobacco: Never Used  . Alcohol Use: No  . Drug Use: No  . Sexual Activity: Yes    Birth Control/ Protection: Surgical   Other Topics Concern  . None   Social History Narrative   No regular exercise    Family History  Problem Relation Age of Onset  . Hypertension Father   . Cancer Paternal Aunt     colon  . Cancer Paternal Uncle     colon  . Cancer Cousin     colon

## 2013-11-22 ENCOUNTER — Telehealth: Payer: Self-pay | Admitting: Family Medicine

## 2013-11-22 NOTE — Telephone Encounter (Signed)
Pt states she has received her flu shot this year

## 2013-11-22 NOTE — Telephone Encounter (Signed)
Noted  

## 2013-11-29 ENCOUNTER — Ambulatory Visit (INDEPENDENT_AMBULATORY_CARE_PROVIDER_SITE_OTHER): Payer: 59 | Admitting: Family Medicine

## 2013-11-29 ENCOUNTER — Encounter: Payer: Self-pay | Admitting: Family Medicine

## 2013-11-29 VITALS — BP 120/72 | HR 92 | Temp 98.4°F | Resp 20 | Ht 61.0 in | Wt 180.0 lb

## 2013-11-29 DIAGNOSIS — M7711 Lateral epicondylitis, right elbow: Secondary | ICD-10-CM

## 2013-11-29 DIAGNOSIS — G4700 Insomnia, unspecified: Secondary | ICD-10-CM

## 2013-11-29 DIAGNOSIS — I10 Essential (primary) hypertension: Secondary | ICD-10-CM

## 2013-11-29 DIAGNOSIS — M7710 Lateral epicondylitis, unspecified elbow: Secondary | ICD-10-CM | POA: Insufficient documentation

## 2013-11-29 DIAGNOSIS — E669 Obesity, unspecified: Secondary | ICD-10-CM

## 2013-11-29 MED ORDER — NAPROXEN 500 MG PO TABS: 500.0000 mg | ORAL_TABLET | Freq: Two times a day (BID) | ORAL | Status: DC

## 2013-11-29 NOTE — Assessment & Plan Note (Signed)
Discussed steroid injection she was hold off on this. I have given her Naprosyn to use twice a day she was used ice in an elbow strap

## 2013-11-29 NOTE — Patient Instructions (Addendum)
Apply Ice  Take anti-inflammatory as prescribed Use elbow strap F/U June for Physical  Tennis Elbow Tennis elbow can happen in any sport or job where you overuse your elbow. It is caused by doing the same motion over and over. This makes small tears in the forearm muscles. It can also cause muscle redness, soreness, and puffiness (inflammation). HOME CARE  Rest.  Use your other hand or arm that is not affected. Change your grip.  Only take medicine as told by your doctor.  Put ice on the area after activity.  Put ice in a plastic bag.  Place a towel between your skin and the bag.  Leave the ice on for 15-20 minutes, 03-04 times a day.  Wear a splint or sling as told by your doctor. This lets the area rest and heal faster. GET HELP RIGHT AWAY IF: Your pain gets worse or does not improve in 2 weeks even with treatment. MAKE SURE YOU:   Understand these instructions.  Will watch your condition.  Will get help right away if you are not doing well or get worse. Document Released: 07/07/2009 Document Revised: 04/11/2011 Document Reviewed: 07/07/2009 [facility] Patient Information 2015 [place], [place]. This information is not intended to replace advice given to you by your health care provider. Make sure you discuss any questions you have with your health care provider.

## 2013-11-29 NOTE — Progress Notes (Signed)
Patient ID: [name], female   DOB: [birth year], 48 y.o.   MRN: [id number]   Subjective:    Patient ID: [name], female    DOB: April 13, 1965, 48 y.o.   MRN: [id number]  Patient presents for 6 mos floow up  patient here with right elbow pain on the lateral aspect on and off for the past couple months. She does not any particular injury. She denied any swelling of the joint. She has used some topical icy hot with minimal improvement. She denies any. Seizures in her hands.  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  Regarding her reflux since she has lost 15 pounds since last year she's not had any difficulty with her reflux her blood pressures also been well controlled without medications. She continues to have some insomnia but does not one any prescribed medications.    Review Of Systems:  GEN- denies fatigue, fever, weight loss,weakness, recent illness HEENT- denies eye drainage, change in vision, nasal discharge, CVS- denies chest pain, palpitations RESP- denies SOB, cough, wheeze ABD- denies N/V, change in stools, abd pain GU- denies dysuria, hematuria, dribbling, incontinence MSK- +oint pain, muscle aches, injury Neuro- denies headache, dizziness, syncope, seizure activity       Objective:    BP 120/72  Pulse 92  Temp(Src) 98.4 F (36.9 C) (Oral)  Resp 20  Ht 5\' 1"  (1.549 m)  Wt 180 lb (81.647 kg)  BMI 34.03 kg/m2 GEN- NAD, alert and oriented x3 HEENT- PERRL, EOMI, non injected sclera, pink conjunctiva, MMM, oropharynx clear CVS- RRR, no murmur RESP-CTAB MSK- Right elbow normal inspection, no bony abnormality, TTP lateral epicondyle, pain with resistance to pronation, no effusion noted, FROM  EXT- No edema Pulses- Radial, DP- 2+        Assessment & Plan:       Problem List Items Addressed This Visit   Lateral epicondylitis - Primary   Relevant Medications      naproxen (NAPROSYN) tablet   Insomnia   Essential hypertension, benign      Note: This dictation was prepared with Dragon dictation along with smaller phrase technology. Any transcriptional errors that result from this process are unintentional.

## 2013-11-29 NOTE — Assessment & Plan Note (Signed)
Commended on weight loss

## 2013-11-29 NOTE — Assessment & Plan Note (Signed)
Blood pressure well controlled. I did review her recent nonfasting metabolic panel and CBC which were unremarkable. Her lipid panel in June 2014 was normal we would just repeat this in June 2016

## 2013-11-29 NOTE — Assessment & Plan Note (Signed)
Continue melatonin as needed

## 2013-12-17 ENCOUNTER — Encounter: Payer: Self-pay | Admitting: Family Medicine

## 2013-12-31 ENCOUNTER — Ambulatory Visit (INDEPENDENT_AMBULATORY_CARE_PROVIDER_SITE_OTHER): Payer: 59 | Admitting: Family Medicine

## 2013-12-31 ENCOUNTER — Encounter: Payer: Self-pay | Admitting: Family Medicine

## 2013-12-31 VITALS — BP 126/74 | HR 80 | Temp 98.1°F | Resp 16 | Ht 62.0 in | Wt 181.0 lb

## 2013-12-31 DIAGNOSIS — M7711 Lateral epicondylitis, right elbow: Secondary | ICD-10-CM

## 2013-12-31 NOTE — Progress Notes (Signed)
   Subjective:    Patient ID: [name], female    DOB: Jun 30, 1965, 48 y.o.   MRN: [id number]  HPI She is a very pleasant 48 year old white female who is building dealing with chronic pain in her right elbow. Pain is located just distal to the right lateral epicondyles. It is made worse by resisted wrist dorsiflexion, gripping motion of the hand, finger extension. She denies any injury to the elbow. She has tried an elbow strap without benefit. She is tried anti-inflammatories without benefit. She is requesting a cortisone injection for epicondylitis Past Medical History  Diagnosis Date  . Dysphagia   . Chronic gastritis   . Esophageal dilatation   . Duodenitis 08/2005  . Depression    Past Surgical History  Procedure Laterality Date  . Breast mass excision      bilateral tumor removal  . Tubal ligation    . Esophageal dilation  08/2005  . Wisdom tooth extraction    . Breast biopsy Right 04/26/2013    Procedure: BREAST BIOPSY TIMES TWO;  Surgeon: Jamesetta So, MD;  Location: [facility];  Service: General;  Laterality: Right;  . Dilitation & currettage/hystroscopy with thermachoice ablation N/A 10/11/2013    Procedure: DILATATION & CURETTAGE/HYSTEROSCOPY WITH THERMACHOICE ABLATION;  Surgeon: Florian Buff, MD;  Location: [facility];  Service: Gynecology;  Laterality: N/A;  total therapy time- 20 minutes temp  76 deg C   No current outpatient prescriptions on file prior to visit.   No current facility-administered medications on file prior to visit.   No current outpatient prescriptions on file prior to visit.   No current facility-administered medications on file prior to visit.   Allergies  Allergen Reactions  . Lisinopril Cough   History   Social History  . Marital Status: Married    Spouse Name: N/A    Number of Children: N/A  . Years of Education: N/A   Occupational History  . Full time    Social History Main Topics  . Smoking status: Never Smoker   . Smokeless  tobacco: Never Used  . Alcohol Use: No  . Drug Use: No  . Sexual Activity: Yes    Birth Control/ Protection: Surgical   Other Topics Concern  . Not on file   Social History Narrative   No regular exercise      Review of Systems  All other systems reviewed and are negative.      Objective:   Physical Exam  Cardiovascular: Normal rate and regular rhythm.   Pulmonary/Chest: Effort normal and breath sounds normal.  Musculoskeletal:       Right elbow: She exhibits normal range of motion, no swelling, no effusion and no deformity. Tenderness found. Lateral epicondyle tenderness noted. No radial head, no medial epicondyle and no olecranon process tenderness noted.  Vitals reviewed.         Assessment & Plan:  Lateral epicondylitis (tennis elbow), right  Using sterile technique, I injected the tendon insertion just distal to the right lateral upper condyle with a mixture of 1 mL of 0.1% lidocaine and 1 mL of 40 mg per mL Kenalog. The patient tolerated the procedure well without complication. I did recommend that she wear an elbow strap for the next 2-3 weeks to help reduce the stress in that area.

## 2014-01-30 ENCOUNTER — Encounter: Payer: Self-pay | Admitting: Family Medicine

## 2014-03-10 ENCOUNTER — Ambulatory Visit (INDEPENDENT_AMBULATORY_CARE_PROVIDER_SITE_OTHER): Payer: 59

## 2014-03-10 ENCOUNTER — Ambulatory Visit (INDEPENDENT_AMBULATORY_CARE_PROVIDER_SITE_OTHER): Payer: 59 | Admitting: Orthopedic Surgery

## 2014-03-10 ENCOUNTER — Encounter: Payer: Self-pay | Admitting: Orthopedic Surgery

## 2014-03-10 VITALS — BP 127/85 | Ht 62.0 in | Wt 177.0 lb

## 2014-03-10 DIAGNOSIS — M7711 Lateral epicondylitis, right elbow: Secondary | ICD-10-CM

## 2014-03-10 DIAGNOSIS — M25511 Pain in right shoulder: Secondary | ICD-10-CM

## 2014-03-10 MED ORDER — CYCLOBENZAPRINE HCL 10 MG PO TABS
10.0000 mg | ORAL_TABLET | Freq: Every day | ORAL | Status: DC
Start: 2014-03-10 — End: 2014-09-26

## 2014-03-10 NOTE — Progress Notes (Signed)
Patient ID: [name], female   DOB: [birth year], 49 y.o.   MRN: [id number]  Chief Complaint  Patient presents with  . Shoulder Pain    Right shoulder pain, no injury.   Second complaint right elbow pain 6 months no injury. The patient actually has pain over the right trapezius muscle and none over the glenohumeral joint are deltoid area. She's had that for one year. She has pain over her right elbow lateral aspect for 6 months she received a cortisone injection did not improve she does complain of some numbness and tingling with throbbing burning pain in the right elbow and also throbbing pain over the right trapezius muscle without Stiffness. Her pain has become constant and mostly confined to the elbow she rates as 10. It's worse with motion she has increased pain when she's sleeping at night and when she's using her arm to lift things or move about during her work activities.  System review is negative except for the joint limb pain and muscle weakness with the numbness and tingling.  Past Medical History  Diagnosis Date  . Dysphagia   . Chronic gastritis   . Esophageal dilatation   . Duodenitis 08/2005  . Depression    Past Surgical History  Procedure Laterality Date  . Breast mass excision      bilateral tumor removal  . Tubal ligation    . Esophageal dilation  08/2005  . Wisdom tooth extraction    . Breast biopsy Right 04/26/2013    Procedure: BREAST BIOPSY TIMES TWO;  Surgeon: Jamesetta So, MD;  Location: [facility];  Service: General;  Laterality: Right;  . Dilitation & currettage/hystroscopy with thermachoice ablation N/A 10/11/2013    Procedure: DILATATION & CURETTAGE/HYSTEROSCOPY WITH THERMACHOICE ABLATION;  Surgeon: Florian Buff, MD;  Location: [facility];  Service: Gynecology;  Laterality: N/A;  total therapy time- 20 minutes temp  76 deg C    Vital signs are BP 127/85 mmHg  Ht 5\' 2"  (1.575 m)  Wt 177 lb (80.287 kg)  BMI 32.37 kg/m2  Appearance is well groomed  She  is oriented to person place and time  Mood is pleasant  Ambulatory status is normal  Palpable tenderness over the right trapezius muscle with nontender cervical spine centrally. Normal range of motion of the cervical spine muscle tension increased on the right normal on the left in the trapezius muscle skin intact.  Right shoulder full range of motion no tenderness stability tests were normal and cuff strength was intact skin intact sensation over the deltoid normal  Right elbow tenderness lateral epicondyle painful wrist extension test full range of motion ligament stable motor exam normal skin intact good radial pulse normal sensation epitrochlear lymph nodes are normal  X-rays are normal as well today  Impression cervical spine muscle tension recommend reevaluate workstation and Flexeril 10 mg at night  Impression tennis elbow right elbow recommend injection, supple brace, exercises and topical diclofenac with lidocaine menthol mixture secondary to her reflux and esophageal issues.  Come back in 8 weeks.  Right elbow injection  Procedure note injection for tennis elbow right  Diagnosis tennis elbow right   Anesthesia ethyl chloride was used Alcohol use is clean the skin  After we obtained verbal consent and timeout a 25-gauge needle was used to inject 40 mg of Depo-Medrol and 3 cc of 1% lidocaine.  There were no complications and a sterile bandage was applied.

## 2014-05-06 ENCOUNTER — Ambulatory Visit: Payer: 59 | Admitting: Orthopedic Surgery

## 2014-05-19 ENCOUNTER — Encounter: Payer: Self-pay | Admitting: Family Medicine

## 2014-05-19 MED ORDER — SERTRALINE HCL 50 MG PO TABS: 50.0000 mg | ORAL_TABLET | Freq: Every day | ORAL | Status: DC

## 2014-06-04 ENCOUNTER — Telehealth: Payer: Self-pay | Admitting: Gastroenterology

## 2014-06-04 DIAGNOSIS — B360 Pityriasis versicolor: Secondary | ICD-10-CM | POA: Insufficient documentation

## 2014-06-04 MED ORDER — KETOCONAZOLE 200 MG PO TABS: 200.0000 mg | ORAL_TABLET | Freq: Once | ORAL | Status: DC

## 2014-06-04 NOTE — Telephone Encounter (Signed)
HAVING A FLARE OF TINEA. REQUESTS NIZORAL.

## 2014-06-04 NOTE — Assessment & Plan Note (Signed)
RECURRENT  NIZORAL ONE PIL A WEEK x2. MEDICATION WARNINGS DISCUSSED.

## 2014-07-04 ENCOUNTER — Encounter: Payer: 59 | Admitting: Family Medicine

## 2014-07-10 ENCOUNTER — Telehealth: Payer: Self-pay | Admitting: Family Medicine

## 2014-07-10 NOTE — Telephone Encounter (Signed)
Patient is calling to speak about her Topamax and the dosage that the pharmacy has given her for a while  [id number]

## 2014-07-11 NOTE — Telephone Encounter (Signed)
Returned call to patient.   Reports that she has been given Phentermine for weight loss by Dr. Marianna Payment. States that she has reached a plateau with phentermine. Reports that Dr. Marianna Payment began her Topamax 25mg  at bedtime for appetite suppression.   States that she has been taking Topamax x1 month. Reports that when she went for refill, she noted the tablets looked different. Upon further review, she noted that the first prescription given to her by the pharmacy was for Topamax 200mg , and she had completed that prescription. The new prescription is for Topamax 25mg .   Reports that she spoke with pharmacy and was made aware that error had occurred on their end. States that she made Dr. Marianna Payment aware and new instructions were obtained to slowly taper down to 25mg  at bedtime.  Patient reports that she is concerned about what side effects she could have from taking the increased dosage x1 month. States that she has been having increased confusion, memory issues and blurred vision. Reports that symptoms have worsened since she has begun tapering down.   Advised to contact Dr. Marianna Payment and discuss concerns. MD made aware.

## 2014-07-11 NOTE — Telephone Encounter (Signed)
I agree pt needs to discuss with Dr. Marianna Payment The higher dose of topamax can cause some of her side effects, since she started at such a high dose, but symptoms should improve as she tapers down on the dose,

## 2014-07-11 NOTE — Telephone Encounter (Signed)
To MD

## 2014-07-13 IMAGING — US US BREAST LTD UNI RIGHT INC AXILLA
1 series · 7 of 7 positions shown · non-contrast
Comparison: Previous exams.

CLINICAL DATA: 47-year-old female with history of spontaneous clear
right nipple discharge 1-2 weeks prior.

EXAM:
DIGITAL DIAGNOSTIC  BILATERAL MAMMOGRAM WITH CAD
ULTRASOUND RIGHT BREAST

[Series 1: us breast ltd uni right inc axilla · 0.04mm/px · 7 of 7 slices shown]
[im 1/7]
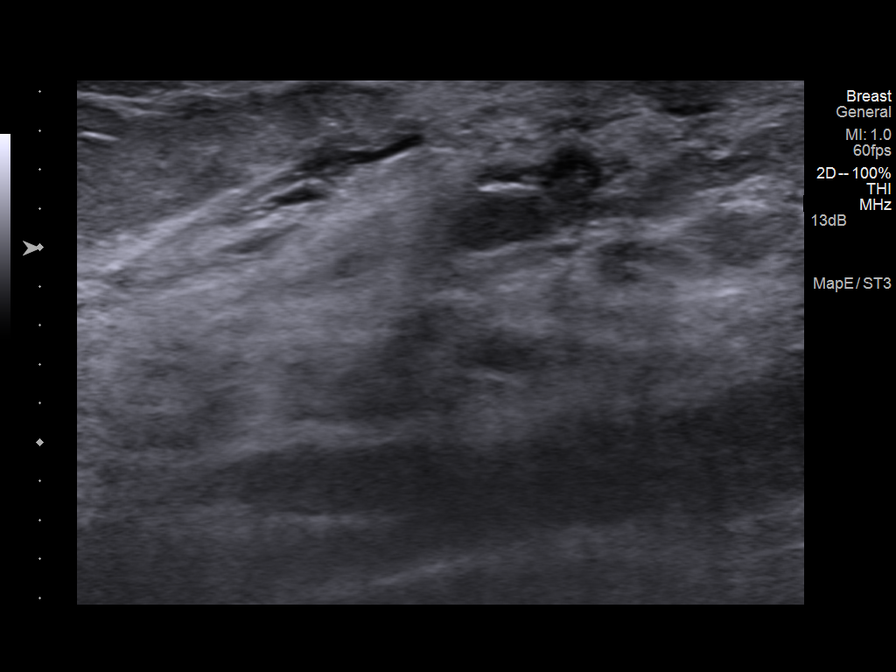
[im 2/7]
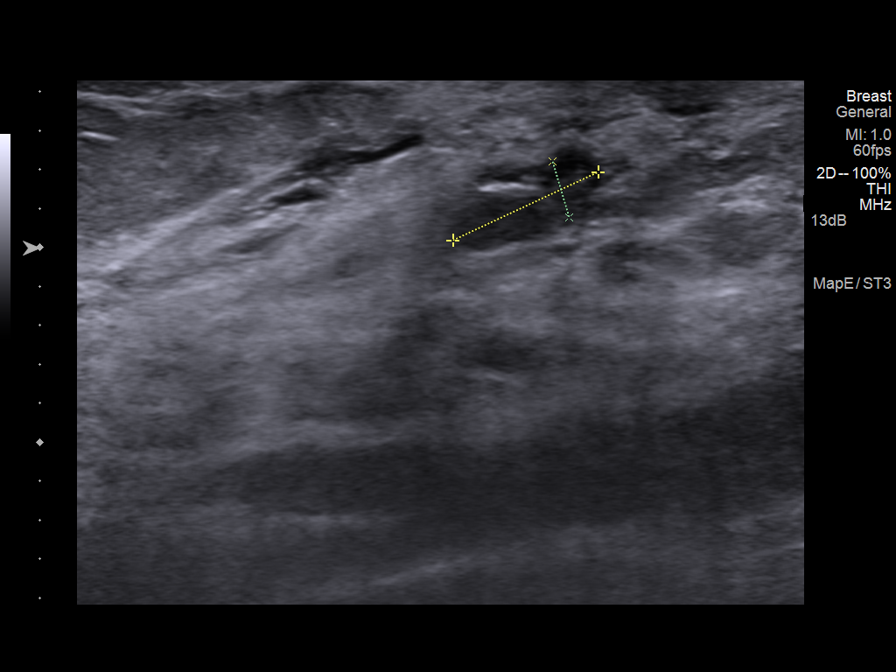
[im 3/7]
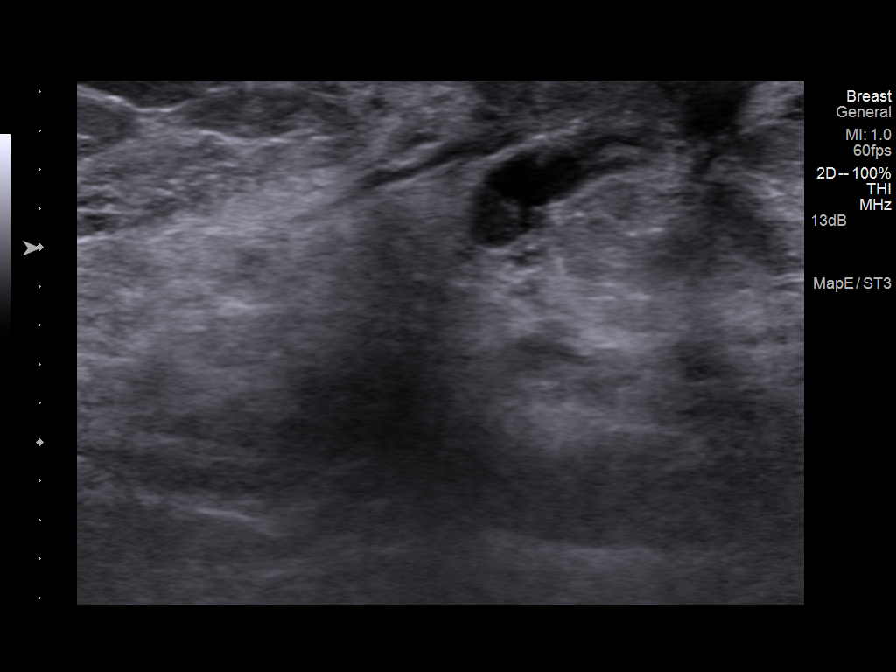
[im 4/7]
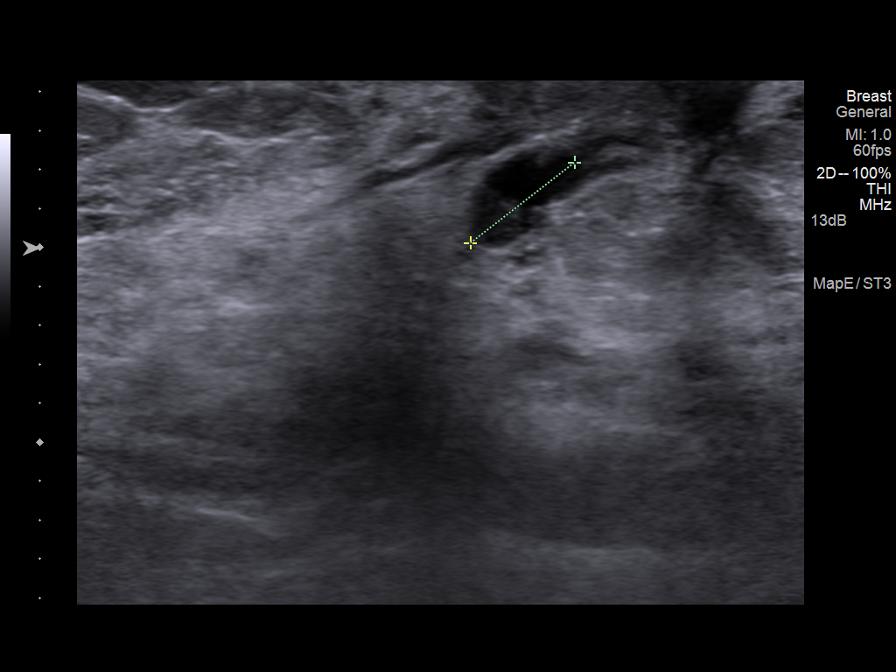
[im 5/7]
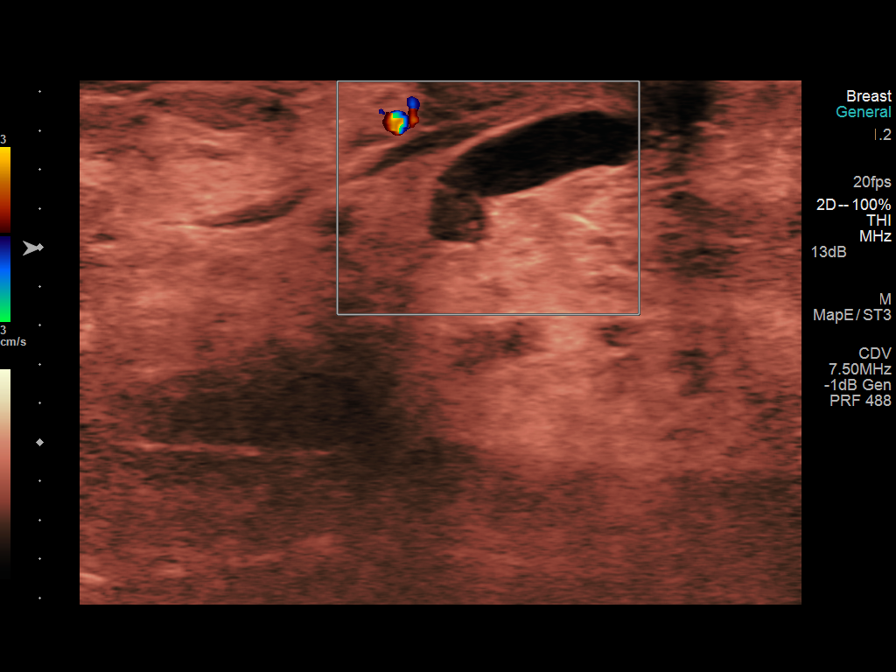
[im 6/7]
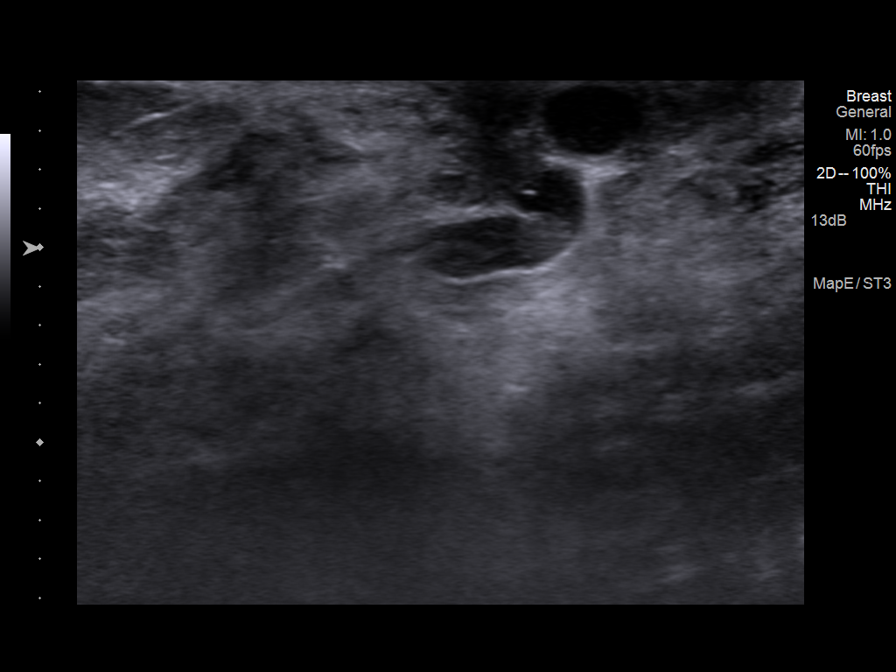
[im 7/7]
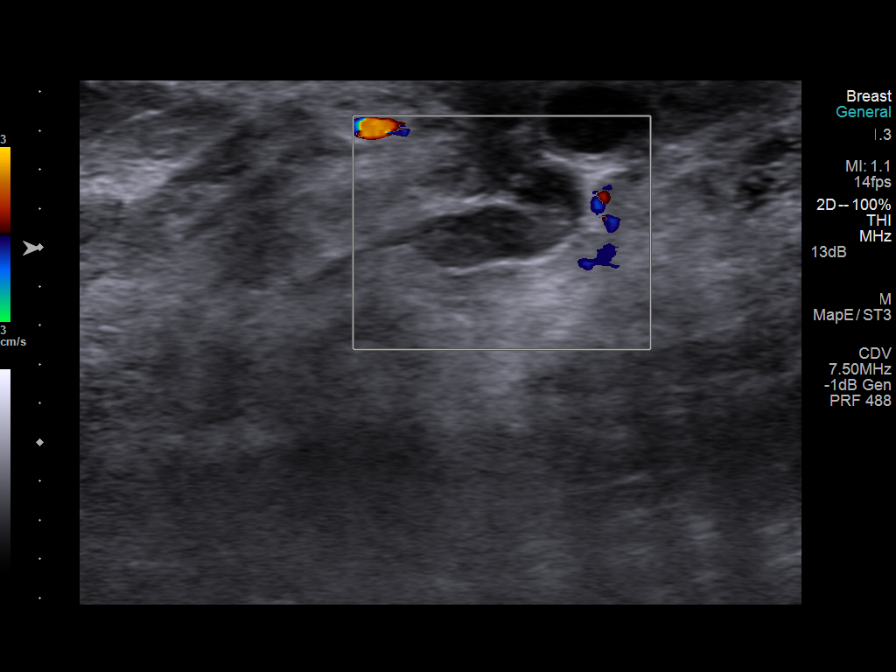

[7 of 7 positions shown; findings below may reference images not displayed]

ACR Breast Density Category c: The breast tissue is heterogeneously
dense, which may obscure small masses.
FINDINGS: No suspicious masses or calcifications are seen in either breast.
There are no mammographic findings of malignancy in either breast.

Mammographic images were processed with CAD.

Physical examination of the subareolar right breast is not reveal
any palpable masses. Discharge could not be elicited from the right
nipple.

Targeted ultrasound of the subareolar right breast was performed
demonstrating a focally dilated duct in the subareolar right breast
at 9 o'clock containing an intraductal filling defect measuring
approximately 0.8 x 0.3 x 0.7 cm.
IMPRESSION: 1. Reported clear spontaneous right nipple discharge, however
discharge could not be elicited today from the patient's right
nipple for further evaluation.

2. Focally dilated right subareolar duct containing an intraductal
filling defect.

RECOMMENDATION:
Ultrasound-guided biopsy of the intraductal filling defect in the
right breast is recommended. This will be scheduled at the patient's
convenience.

I have discussed the findings and recommendations with the patient.
Results were also provided in writing at the conclusion of the
visit. If applicable, a reminder letter will be sent to the patient
regarding the next appointment.

BI-RADS CATEGORY  4: Suspicious abnormality - biopsy should be
considered.

## 2014-07-20 IMAGING — US US RT BREAST BX W LOC DEV 1ST LESION IMG BX SPEC US GUIDE
1 series · 7 of 7 positions shown · non-contrast
Comparison: Previous exams.

ADDENDUM:
Pathology demonstrates a complex sclerosing lesion and fibrocystic
changes with calcifications. This is concordant. Recommend surgical
consultation for excision and evaluation of nipple discharge.
Results and recommendations were discussed with the patient the
afternoon of 03/29/2013. The patient states that her right breast is
sore following biopsy. She has no other complaints. A surgical
appointment will be scheduled for the patient.
CLINICAL DATA: Patient with intraductal right breast mass at 9
o'clock in the subareolar location. Patient has right nipple
discharge.

EXAM:
ULTRASOUND GUIDED RIGHT BREAST CORE NEEDLE BIOPSY WITH VACUUM ASSIST

[Series 1: us right breast bx w loc dev 1st lesion img bx spe · 0.05mm/px · 7 of 7 slices shown]
[im 1/7]
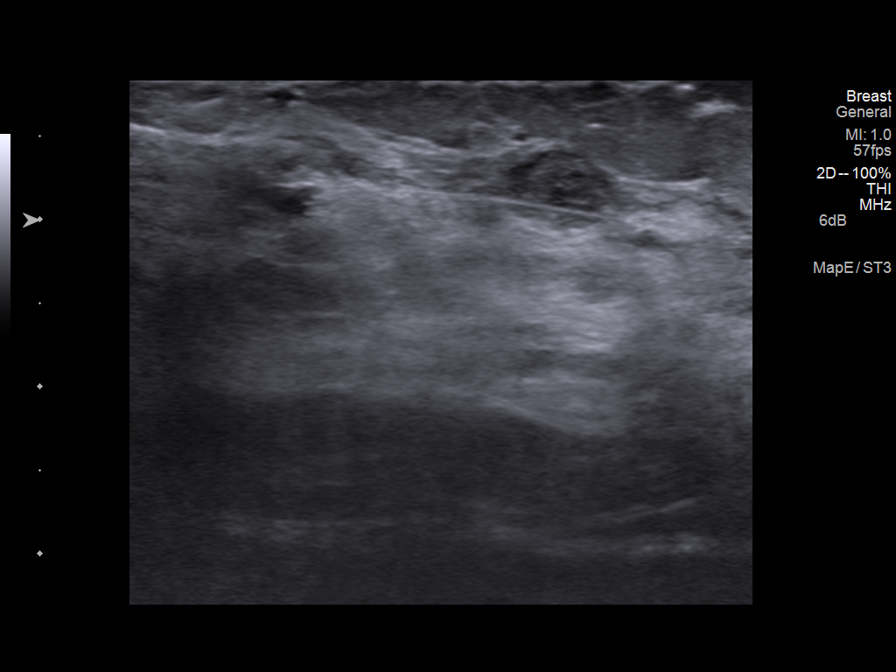
[im 2/7]
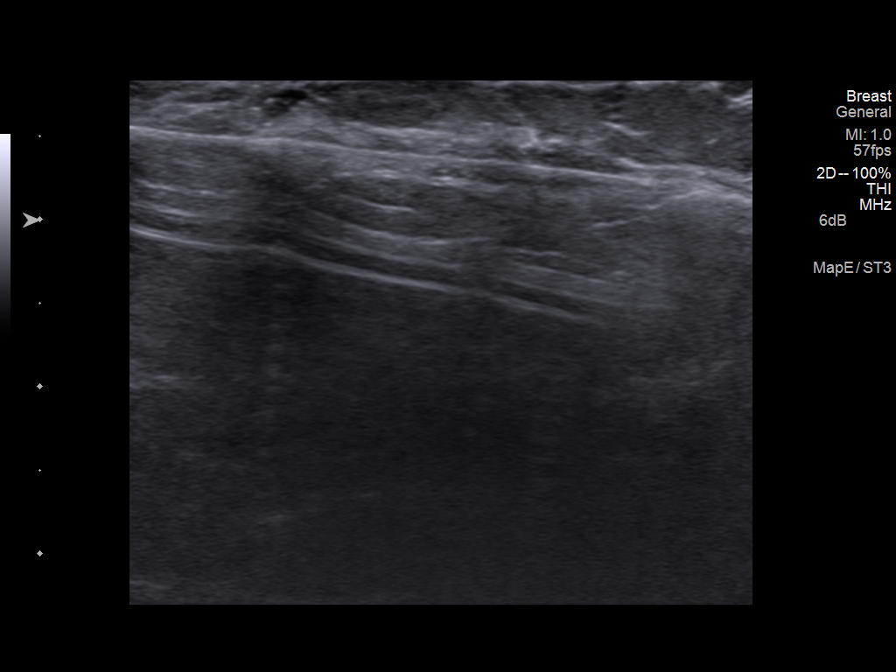
[im 3/7]
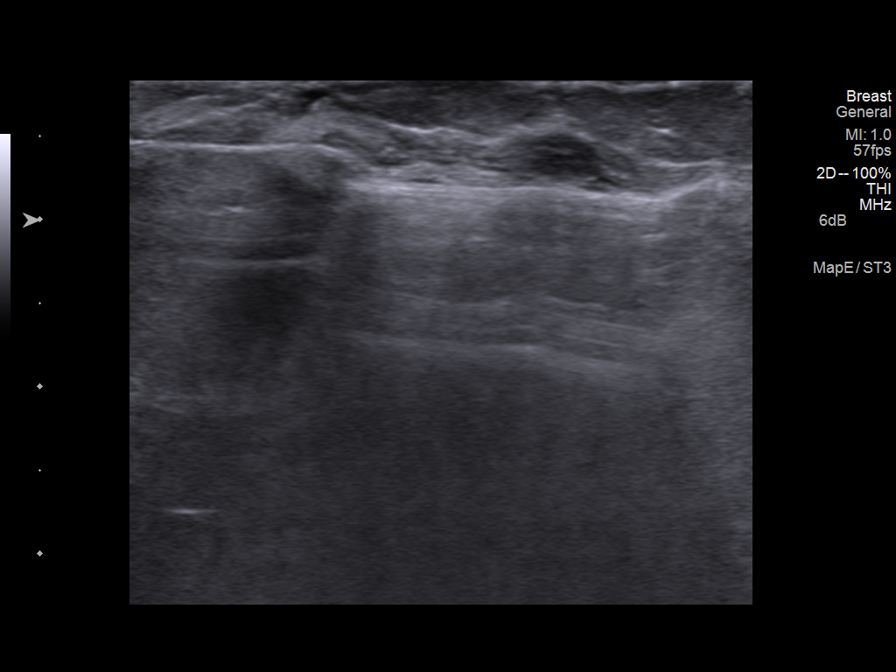
[im 4/7]
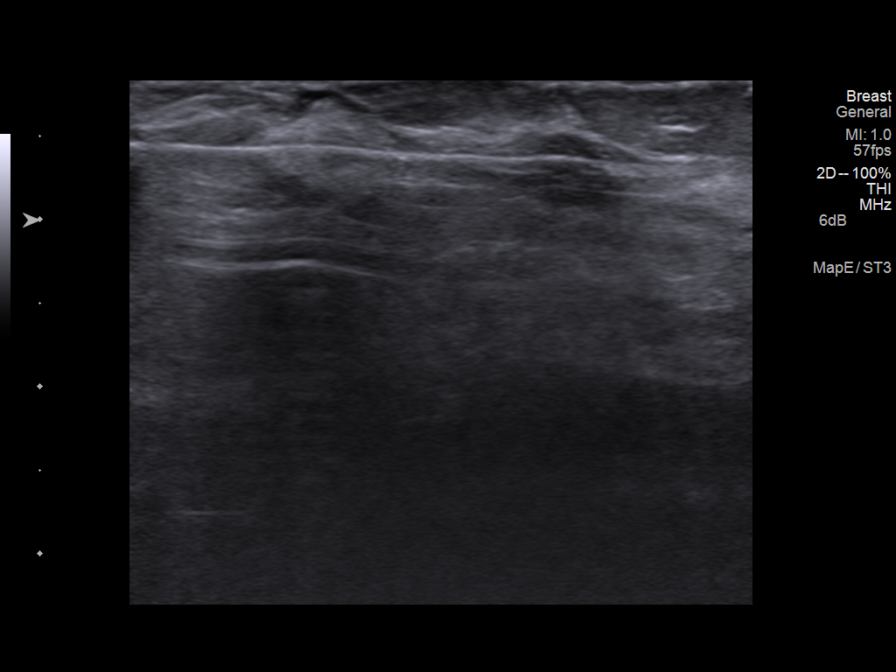
[im 5/7]
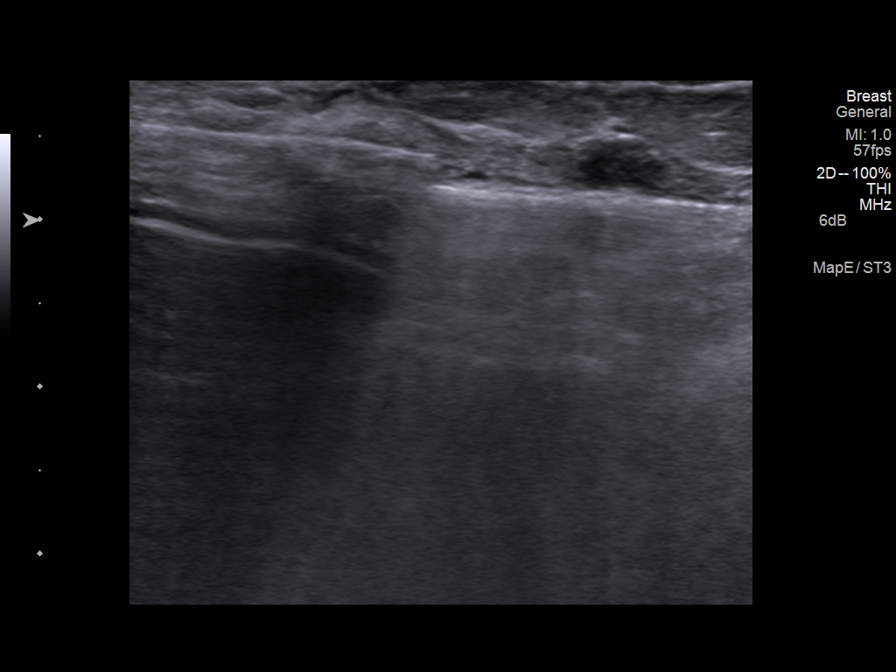
[im 6/7]
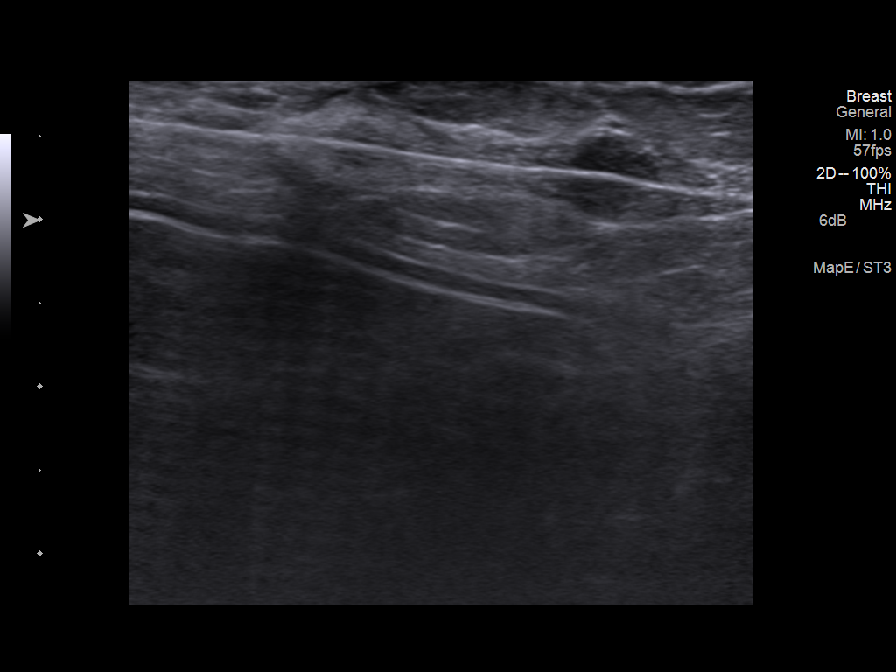
[im 7/7]
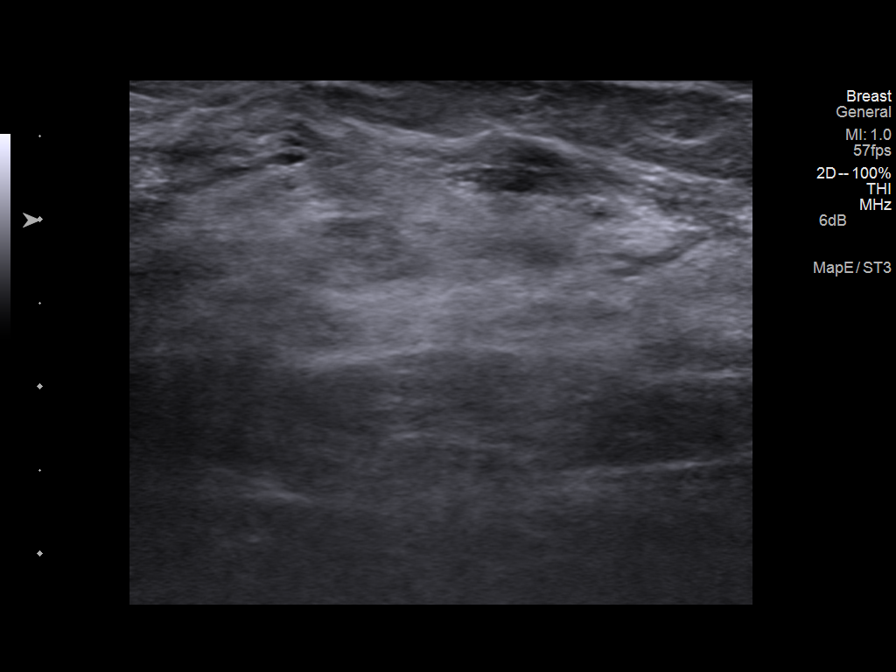

[7 of 7 positions shown; findings below may reference images not displayed]

PROCEDURE:
I met with the patient and we discussed the procedure of
ultrasound-guided biopsy, including benefits and alternatives. We
discussed the high likelihood of a successful procedure. We
discussed the risks of the procedure including infection, bleeding,
tissue injury, clip migration, and inadequate sampling. Informed
written consent was given. The usual time-out protocol was performed
immediately prior to the procedure.

Using sterile technique and 2% Lidocaine as local anesthetic, under
direct ultrasound visualization, a 12 gauge vacuum-assisteddevice
was used to perform biopsy of a right breast mass at 9 o'clock in
the subareolar region using a inferior to superior approach. At the
conclusion of the procedure, a wing shaped tissue marker clip was
deployed into the biopsy cavity. Follow-up 2-view mammogram was
performed and dictated separately.
IMPRESSION: Ultrasound-guided biopsy of a right breast mass at 9 o'clock. No
apparent complications.

## 2014-09-26 ENCOUNTER — Ambulatory Visit (INDEPENDENT_AMBULATORY_CARE_PROVIDER_SITE_OTHER): Payer: 59 | Admitting: Family Medicine

## 2014-09-26 ENCOUNTER — Encounter: Payer: Self-pay | Admitting: Family Medicine

## 2014-09-26 VITALS — BP 122/78 | HR 76 | Temp 98.3°F | Resp 14 | Ht 62.0 in | Wt 178.0 lb

## 2014-09-26 DIAGNOSIS — R413 Other amnesia: Secondary | ICD-10-CM | POA: Diagnosis not present

## 2014-09-26 DIAGNOSIS — F39 Unspecified mood [affective] disorder: Secondary | ICD-10-CM

## 2014-09-26 DIAGNOSIS — E669 Obesity, unspecified: Secondary | ICD-10-CM | POA: Diagnosis not present

## 2014-09-26 DIAGNOSIS — Z1239 Encounter for other screening for malignant neoplasm of breast: Secondary | ICD-10-CM | POA: Diagnosis not present

## 2014-09-26 DIAGNOSIS — F338 Other recurrent depressive disorders: Secondary | ICD-10-CM

## 2014-09-26 DIAGNOSIS — I10 Essential (primary) hypertension: Secondary | ICD-10-CM | POA: Diagnosis not present

## 2014-09-26 DIAGNOSIS — R5383 Other fatigue: Secondary | ICD-10-CM

## 2014-09-26 DIAGNOSIS — Z0000 Encounter for general adult medical examination without abnormal findings: Secondary | ICD-10-CM | POA: Diagnosis not present

## 2014-09-26 LAB — LIPID PANEL
CHOL/HDL RATIO: 3.4 ratio (ref <=5.0)
CHOLESTEROL: 155 mg/dL (ref 125–200)
HDL: 45 mg/dL — ABNORMAL LOW (ref >=46)
LDL Cholesterol: 93 mg/dL (ref <130)
Triglycerides: 84 mg/dL (ref <150)
VLDL: 17 mg/dL (ref <30)

## 2014-09-26 LAB — COMPREHENSIVE METABOLIC PANEL
ALBUMIN: 4.3 g/dL (ref 3.6–5.1)
ALK PHOS: 83 U/L (ref 33–115)
ALT: 11 U/L (ref 6–29)
AST: 13 U/L (ref 10–35)
BUN: 9 mg/dL (ref 7–25)
CHLORIDE: 109 mmol/L (ref 98–110)
CO2: 23 mmol/L (ref 20–31)
Calcium: 8.9 mg/dL (ref 8.6–10.2)
Creat: 0.71 mg/dL (ref 0.50–1.10)
Glucose, Bld: 102 mg/dL — ABNORMAL HIGH (ref 70–99)
POTASSIUM: 4.1 mmol/L (ref 3.5–5.3)
Sodium: 140 mmol/L (ref 135–146)
TOTAL PROTEIN: 7.1 g/dL (ref 6.1–8.1)
Total Bilirubin: 0.5 mg/dL (ref 0.2–1.2)

## 2014-09-26 LAB — CBC WITH DIFFERENTIAL/PLATELET
Basophils Absolute: 0 10*3/uL (ref 0.0–0.1)
Basophils Relative: 0 % (ref 0–1)
Eosinophils Absolute: 0.1 10*3/uL (ref 0.0–0.7)
Eosinophils Relative: 1 % (ref 0–5)
HEMATOCRIT: 40.7 % (ref 36.0–46.0)
HEMOGLOBIN: 13.8 g/dL (ref 12.0–15.0)
LYMPHS ABS: 2.4 10*3/uL (ref 0.7–4.0)
LYMPHS PCT: 30 % (ref 12–46)
MCH: 28.3 pg (ref 26.0–34.0)
MCHC: 33.9 g/dL (ref 30.0–36.0)
MCV: 83.4 fL (ref 78.0–100.0)
MONOS PCT: 6 % (ref 3–12)
MPV: 9.6 fL (ref 8.6–12.4)
Monocytes Absolute: 0.5 10*3/uL (ref 0.1–1.0)
NEUTROS ABS: 5 10*3/uL (ref 1.7–7.7)
Neutrophils Relative %: 63 % (ref 43–77)
Platelets: 288 10*3/uL (ref 150–400)
RBC: 4.88 MIL/uL (ref 3.87–5.11)
RDW: 14.6 % (ref 11.5–15.5)
WBC: 7.9 10*3/uL (ref 4.0–10.5)

## 2014-09-26 LAB — VITAMIN B12: Vitamin B-12: >2000 pg/mL — ABNORMAL HIGH (ref 211–911)

## 2014-09-26 LAB — TSH: TSH: 1.952 u[IU]/mL (ref 0.350–4.500)

## 2014-09-26 NOTE — Assessment & Plan Note (Signed)
Controlled off medications 

## 2014-09-26 NOTE — Progress Notes (Signed)
Patient ID: [name], female   DOB: February 09, 1965, 49 y.o.   MRN: [id number]   Subjective:    Patient ID: [name], female    DOB: 12/20/1965, 49 y.o.   MRN: [id number]  Patient presents for CPE and Depression  A she here for complete physical exam. She is due for her mammogram by her GYN otherwise up-to-date. Her immunizations are up-to-date. She is still having side effects from medications. She was followed by a weight loss clinic she was on Pentair mean and then she was started on Topamax however they started had 200 mg which was accidental during that time she was on the 200 mg she had mood swings she was very quick to anger she did not sleep very well she had paresthesias in her hands and legs and weak spells she also had blurry vision. When she went to refill the medication she saw a different dose and then noticed that she had been taken 200 mg instead of 25 mg. She was tapered off of the medication as been off of Topamax for the past 30 days most of her symptoms have improved [place] if she still has mental fogginess lack of energy, and feel like she is not herself. She was concerned that she might have MS and she was worried about her hormones which is all the different symptoms. She stopped taking the phentermine about 2 weeks ago so she is not on any medications at this time. She does see some improvement in her mental clearing the longer she is off of the medications.    Review Of Systems:  GEN-+fatigue, fever, weight loss,weakness, recent illness HEENT- denies eye drainage, change in vision, nasal discharge, CVS- denies chest pain, palpitations RESP- denies SOB, cough, wheeze ABD- denies N/V, change in stools, abd pain GU- denies dysuria, hematuria, dribbling, incontinence MSK- + joint pain, muscle aches, injury Neuro- denies headache, dizziness, syncope, seizure activity       Objective:    BP 122/78 mmHg  Pulse 76  Temp(Src) 98.3 F (36.8 C) (Oral)  Resp 14  Ht 5\' 2"   (1.575 m)  Wt 178 lb (80.74 kg)  BMI 32.55 kg/m2 GEN- NAD, alert and oriented x3 HEENT- PERRL, EOMI, non injected sclera, pink conjunctiva, MMM, oropharynx clear Neck- Supple, no thyromegaly CVS- RRR, no murmur RESP-CTAB ABD-NABS,soft,NT,ND EXT- No edema Neuro-CNII-XII intact, no focal deficits, normal tone, normal gait Psych- stressed appearing, not anxious, no SI, normal speech, thought process in tact Pulses- Radial, DP- 2+        Assessment & Plan:      Problem List Items Addressed This Visit    Obesity   Relevant Orders   Lipid panel   Essential hypertension, benign    Controlled off medications       Other Visit Diagnoses    Other fatigue    -  Primary    I think her symptoms of memory problems fatigue, may be related to the topamax, I doubt MS based on improvement with being off medications. I am going to check hormone levels as well, menses is monthly, check TSH, will have her stay off all prescription medications for the next few weeks to see if memory continues to clear, if not MRI of brain will be done especially if labs are normal    Relevant Orders    FSH/LH    TSH    Vitamin B12    Routine general medical examination at a health care facility  CPE done, pt to schedule mammogram, immunizations UTD, followed by GYN    Relevant Orders    CBC with Differential/Platelet    Comprehensive metabolic panel    Lipid panel       Note: This dictation was prepared with Dragon dictation along with smaller phrase technology. Any transcriptional errors that result from this process are unintentional.

## 2014-09-26 NOTE — Patient Instructions (Addendum)
Call for Mamogram  Do not take any more phentermine  Or other medications for next 2 weeks We will call with lab results F/U pending results

## 2014-09-26 NOTE — Assessment & Plan Note (Signed)
History of seasonal depression which could also be in the background of above symptoms, consider Wellbutrin as a step after other work up per above

## 2014-09-27 LAB — FSH/LH
FSH: 2.9 m[IU]/mL
LH: 2.6 m[IU]/mL

## 2014-09-30 ENCOUNTER — Encounter: Payer: Self-pay | Admitting: Family Medicine

## 2014-10-03 ENCOUNTER — Ambulatory Visit (HOSPITAL_COMMUNITY): Payer: 59

## 2014-10-03 ENCOUNTER — Other Ambulatory Visit: Payer: Self-pay | Admitting: Family Medicine

## 2014-10-03 ENCOUNTER — Ambulatory Visit (HOSPITAL_COMMUNITY)
Admission: RE | Admit: 2014-10-03 | Discharge: 2014-10-03 | Disposition: A | Discharge status: Home / Self Care | Payer: 59 | Source: Ambulatory Visit | Attending: Family Medicine | Admitting: Family Medicine

## 2014-10-03 DIAGNOSIS — Z1231 Encounter for screening mammogram for malignant neoplasm of breast: Secondary | ICD-10-CM

## 2014-10-16 ENCOUNTER — Encounter: Payer: Self-pay | Admitting: Family Medicine

## 2014-10-20 ENCOUNTER — Encounter: Payer: Self-pay | Admitting: Family Medicine

## 2014-10-20 MED ORDER — BUPROPION HCL ER (XL) 150 MG PO TB24: 150.0000 mg | ORAL_TABLET | Freq: Every day | ORAL | Status: DC

## 2014-12-09 ENCOUNTER — Encounter: Payer: Self-pay | Admitting: Family Medicine

## 2015-02-20 DIAGNOSIS — H524 Presbyopia: Secondary | ICD-10-CM | POA: Diagnosis not present

## 2015-02-20 DIAGNOSIS — H52221 Regular astigmatism, right eye: Secondary | ICD-10-CM | POA: Diagnosis not present

## 2015-02-20 DIAGNOSIS — H5213 Myopia, bilateral: Secondary | ICD-10-CM | POA: Diagnosis not present

## 2015-02-20 DIAGNOSIS — H35712 Central serous chorioretinopathy, left eye: Secondary | ICD-10-CM | POA: Diagnosis not present

## 2015-05-13 ENCOUNTER — Telehealth: Payer: Self-pay | Admitting: Gastroenterology

## 2015-05-13 MED ORDER — KETOCONAZOLE 200 MG PO TABS: ORAL_TABLET | ORAL | Status: DC

## 2015-05-13 NOTE — Telephone Encounter (Signed)
PT CALLED FOR REFILL ON NIZORAL. TOLERATED  W/O SIDE EFFECTS.

## 2015-08-25 ENCOUNTER — Encounter: Payer: Self-pay | Admitting: Family Medicine

## 2015-08-25 ENCOUNTER — Ambulatory Visit (INDEPENDENT_AMBULATORY_CARE_PROVIDER_SITE_OTHER): Payer: 59 | Admitting: Family Medicine

## 2015-08-25 VITALS — BP 134/72 | HR 92 | Temp 98.9°F | Resp 18 | Ht 62.0 in | Wt 201.0 lb

## 2015-08-25 DIAGNOSIS — R079 Chest pain, unspecified: Secondary | ICD-10-CM

## 2015-08-25 DIAGNOSIS — F418 Other specified anxiety disorders: Secondary | ICD-10-CM

## 2015-08-25 DIAGNOSIS — N390 Urinary tract infection, site not specified: Secondary | ICD-10-CM | POA: Diagnosis not present

## 2015-08-25 DIAGNOSIS — N898 Other specified noninflammatory disorders of vagina: Secondary | ICD-10-CM

## 2015-08-25 DIAGNOSIS — R300 Dysuria: Secondary | ICD-10-CM | POA: Diagnosis not present

## 2015-08-25 LAB — WET PREP FOR TRICH, YEAST, CLUE
Clue Cells Wet Prep HPF POC: NONE SEEN
Trich, Wet Prep: NONE SEEN
WBC, Wet Prep HPF POC: NONE SEEN
YEAST WET PREP: NONE SEEN

## 2015-08-25 LAB — URINALYSIS, ROUTINE W REFLEX MICROSCOPIC
BILIRUBIN URINE: NEGATIVE
Glucose, UA: NEGATIVE
KETONES UR: NEGATIVE
Nitrite: NEGATIVE
Specific Gravity, Urine: 1.03 (ref 1.001–1.035)
pH: 5.5 (ref 5.0–8.0)

## 2015-08-25 LAB — URINALYSIS, MICROSCOPIC ONLY
CASTS: NONE SEEN [LPF]
Crystals: NONE SEEN [HPF]
Yeast: NONE SEEN [HPF]

## 2015-08-25 MED ORDER — ESCITALOPRAM OXALATE 5 MG PO TABS: 5.0000 mg | ORAL_TABLET | Freq: Every day | ORAL | 2 refills | Status: DC

## 2015-08-25 NOTE — Progress Notes (Signed)
Subjective:    Patient ID: [name], female    DOB: February 02, 1965, 50 y.o.   MRN: [id number]  Patient presents for Increased Stress (reports increased anxiety and stress in life- reports waking up at night with chest tightness/ SOB) and Urinary Issues (states that she has tingling and pain in LE when she notes urge to urinate) Patient here with increased stress anxiety of the past few months. Her stepdaughter has a drug problem was recently arrested she has lost her children her job per home she her husband are not taking care of his stepdaughter they're trying to get on her feet but they're stretching himself then trying to help her. They're also temporary guardians for the eldest daughter who is now visiting. She feels like between home life and work things are very stressful she is not sleeping very well she is stressed he has gained over 20 pounds again since last year. She is planning to go to the weight loss clinic tomorrow as her weight makes her eat a more depressed but she feels like she needs something for her nerves. People at work until that her mood has changed. She denies any suicidal ideations or hallucinations. She's had a couple  episodes of anxiety where she wakes up with her chest hurt in the middle the night she notes that it is more stressed and when she calms down her pain resolves. She denies any shortness of breath nausea radiating symptoms when she does get the chest discomfort.  Full had some tingling at the urethra when she urinates. She's also had some mild vaginal discharge but no odor noted itching.    Review Of Systems:  GEN- denies fatigue, fever, weight loss,weakness, recent illness HEENT- denies eye drainage, change in vision, nasal discharge, CVS-+chest pain, denies  palpitations RESP- denies SOB, cough, wheeze ABD- denies N/V, change in stools, abd pain GU- denies dysuria, hematuria, dribbling, incontinence MSK- denies joint pain, muscle aches,  injury Neuro- denies headache, dizziness, syncope, seizure activity       Objective:    BP 134/72 (BP Location: Right Arm, Patient Position: Sitting, Cuff Size: Large)   Pulse 92   Temp 98.9 F (37.2 C) (Oral)   Resp 18   Ht 5\' 2"  (1.575 m)   Wt 201 lb (91.2 kg)   BMI 36.76 kg/m  GEN- NAD, alert and oriented x3 HEENT- PERRL, EOMI, non injected sclera, pink conjunctiva, MMM, oropharynx clear Neck- Supple, no thyromegaly CVS- RRR, no murmur RESP-CTAB ABD-NABS,soft,NT,ND  GU- normal external genitalia, vaginal mucosa pink and moist, cervix visualized no growth, no blood form os, minimal thin clear discharge, no CMT, no ovarian masses, uterus normal size Psych- stressed appearing, good eye contact, not anxious appearing, no SI   EKG- NSR, no changes from 2015        Assessment & Plan:      Problem List Items Addressed This Visit    Depression with anxiety    Significant stressors at home. She is now her husband are trying to figure out a good plan for the stepdaughter. In the meantime I'm going to start her on Lexapro 5 mg at bedtime. She is seen for weight loss doctor Contrave  may be a good one for her to try advised her that phentermine may cause increasing anxiety since things are very stressful right now. We will follow-up in 4 weeks and see how she is doing       Other Visit Diagnoses    Chest  pain, unspecified chest pain type    -  Primary   I think this is anxiety related, treat per above , EKG reassuring    Relevant Orders   EKG 12-Lead (Completed)   UTI (lower urinary tract infection)       Concern for UTI, start antibiotics, culture sent, wet prep neg   Relevant Orders   Urinalysis, Routine w reflex microscopic (not at [facility]) (Completed)   Urine culture   Vaginal discharge       Relevant Orders   WET PREP FOR [place], YEAST, CLUE (Completed)      Note: This dictation was prepared with Dragon dictation along with smaller phrase technology. Any  transcriptional errors that result from this process are unintentional.

## 2015-08-25 NOTE — Patient Instructions (Signed)
Take lexapro at bedtime Take antibiotics  EKG looks good  F/U 4 weeks for medications

## 2015-08-26 ENCOUNTER — Encounter: Payer: Self-pay | Admitting: Family Medicine

## 2015-08-26 DIAGNOSIS — F418 Other specified anxiety disorders: Secondary | ICD-10-CM | POA: Insufficient documentation

## 2015-08-26 MED ORDER — CIPROFLOXACIN HCL 500 MG PO TABS: 500.0000 mg | ORAL_TABLET | Freq: Two times a day (BID) | ORAL | 0 refills | Status: DC

## 2015-08-26 NOTE — Addendum Note (Signed)
Addended by: Vic Blackbird F on: 08/26/2015 09:52 AM   Modules accepted: Orders

## 2015-08-26 NOTE — Assessment & Plan Note (Signed)
Significant stressors at home. She is now her husband are trying to figure out a good plan for the stepdaughter. In the meantime I'm going to start her on Lexapro 5 mg at bedtime. She is seen for weight loss doctor Contrave  may be a good one for her to try advised her that phentermine may cause increasing anxiety since things are very stressful right now. We will follow-up in 4 weeks and see how she is doing

## 2015-08-27 LAB — URINE CULTURE

## 2015-09-22 ENCOUNTER — Encounter: Payer: Self-pay | Admitting: Family Medicine

## 2015-09-22 ENCOUNTER — Ambulatory Visit (INDEPENDENT_AMBULATORY_CARE_PROVIDER_SITE_OTHER): Payer: 59 | Admitting: Family Medicine

## 2015-09-22 VITALS — BP 124/84 | HR 86 | Temp 98.6°F | Resp 16 | Ht 62.0 in | Wt 195.0 lb

## 2015-09-22 DIAGNOSIS — F418 Other specified anxiety disorders: Secondary | ICD-10-CM | POA: Diagnosis not present

## 2015-09-22 MED ORDER — ESCITALOPRAM OXALATE 10 MG PO TABS: 10.0000 mg | ORAL_TABLET | Freq: Every day | ORAL | 6 refills | Status: DC

## 2015-09-22 NOTE — Patient Instructions (Addendum)
Increase lexapro to 10mg  F/U Dec for Physical

## 2015-09-22 NOTE — Assessment & Plan Note (Signed)
Her symptoms are improving with the Lexapro and increase this to 10 mg at bedtime. Overall she's doing better at work and things are improving at home. We'll continue at this dose for the next few months to see how she is doing in December

## 2015-09-22 NOTE — Progress Notes (Signed)
   Subjective:    Patient ID: [name], female    DOB: Jan 24, 1966, 50 y.o.   MRN: [id number]  Patient presents for Follow-up (4 weeks- medications) Patient here for interim follow-up on her depression/anxiety. She states that things have improved at home. Her stepdaughter has been clean for the past 60 days and will have a court date next week hopefully she will be able to move out of their home. Also the grandchild that she was taking care of temporarily has gone back home to [place]. She feels like the Lexapro stiffly helping to take the edge off she has not been very irritable at work but she thinks the dose could be increased some. The only side effect she noticed initially was some repetitive yawning despite not actually being tired. She is also following with the weight loss clinic she is down 6 pounds her last visit she is currently on phentermine    Review Of Systems:  GEN- denies fatigue, fever, weight loss,weakness, recent illness HEENT- denies eye drainage, change in vision, nasal discharge, CVS- denies chest pain, palpitations RESP- denies SOB, cough, wheeze ABD- denies N/V, change in stools, abd pain GU- denies dysuria, hematuria, dribbling, incontinence MSK- denies joint pain, muscle aches, injury Neuro- denies headache, dizziness, syncope, seizure activity       Objective:    BP 124/84 (BP Location: Left Arm, Patient Position: Sitting, Cuff Size: Normal)   Pulse 86   Temp 98.6 F (37 C) (Oral)   Resp 16   Ht 5\' 2"  (1.575 m)   Wt 195 lb (88.5 kg)   BMI 35.67 kg/m  GEN- NAD, alert and oriented x3 Psych-pleasant, good eye contact, normal affect and mood , no SI        Assessment & Plan:   Approx 15 minutes spent with pt, > 50% on counseling, medications   Problem List Items Addressed This Visit    Depression with anxiety - Primary    Her symptoms are improving with the Lexapro and increase this to 10 mg at bedtime. Overall she's doing better at work  and things are improving at home. We'll continue at this dose for the next few months to see how she is doing in December       Other Visit Diagnoses   None.     Note: This dictation was prepared with Dragon dictation along with smaller phrase technology. Any transcriptional errors that result from this process are unintentional.

## 2016-01-15 ENCOUNTER — Telehealth: Payer: Self-pay

## 2016-01-15 NOTE — Telephone Encounter (Signed)
She has dysphagia at time and coughing. I will send to [facility] also

## 2016-01-15 NOTE — Telephone Encounter (Signed)
Gastroenterology Pre-Procedure Review  Request Date: Requesting Physician:   PATIENT REVIEW QUESTIONS: The patient responded to the following health history questions as indicated:    1. Diabetes Melitis: NO 2. Joint replacements in the past 12 months: NO 3. Major health problems in the past 3 months: NO 4. Has an artificial valve or MVP: NO 5. Has a defibrillator: NO 6. Has been advised in past to take antibiotics in advance of a procedure like teeth cleaning: NO 7. Family history of colon cancer: YES 8. Alcohol Use: NO 9. History of sleep apnea: NO 10. History of coronary artery or other vascular stents placed within the last 12 months: NO    MEDICATIONS & ALLERGIES:    Patient reports the following regarding taking any blood thinners:   Plavix? NO Aspirin? NO Coumadin? NO Brilinta? NO Xarelto? NO Eliquis? NO Pradaxa? NO Savaysa? NO Effient? NO  Patient confirms/reports the following medications:  Current Outpatient Prescriptions  Medication Sig Dispense Refill  . escitalopram (LEXAPRO) 10 MG tablet Take 1 tablet (10 mg total) by mouth at bedtime. 30 tablet 6  . phentermine 37.5 MG capsule Take 37.5 mg by mouth every morning.     No current facility-administered medications for this visit.     Patient confirms/reports the following allergies:  Allergies  Allergen Reactions  . Lisinopril Cough    No orders of the defined types were placed in this encounter.   AUTHORIZATION INFORMATION Primary Insurance: [place],  [place] #: [id number] Group #: [id number] Pre-Cert / Josem Kaufmann required:  Pre-Cert / Josem Kaufmann #:    SCHEDULE INFORMATION: Procedure has been scheduled as follows:  Date: , Time:  Location:   This Gastroenterology Pre-Precedure Review Form is being routed to the following provider(s): [facility]

## 2016-01-15 NOTE — Telephone Encounter (Signed)
Ok, thanks.

## 2016-01-15 NOTE — Telephone Encounter (Signed)
Appropriate for screening colonoscopy. Any issues with upper GI symptoms that would prompt an EGD? Is Dr. Oneida Alar ok triaging for that?   Needs Phenergan 25 mg IV on call.

## 2016-01-15 NOTE — Telephone Encounter (Signed)
Pt would like to have screening TCS/EGD.

## 2016-01-18 ENCOUNTER — Other Ambulatory Visit: Payer: Self-pay

## 2016-01-18 ENCOUNTER — Encounter: Payer: Self-pay | Admitting: Family Medicine

## 2016-01-18 DIAGNOSIS — Z1211 Encounter for screening for malignant neoplasm of colon: Secondary | ICD-10-CM

## 2016-01-18 NOTE — Telephone Encounter (Signed)
REVIEWED. TRIAGE FOR EGD/POSSIBLE DIL/SCREENING TCS.

## 2016-01-18 NOTE — Telephone Encounter (Signed)
Pt is set up for 01/27/16 @ 8:30. She is aware and instructions are in the mail.

## 2016-01-27 ENCOUNTER — Encounter (HOSPITAL_COMMUNITY): Payer: Self-pay

## 2016-01-27 ENCOUNTER — Ambulatory Visit (HOSPITAL_COMMUNITY)
Admission: RE | Admit: 2016-01-27 | Discharge: 2016-01-27 | Disposition: A | Discharge status: Home / Self Care | Payer: 59 | Source: Ambulatory Visit | Attending: Gastroenterology | Admitting: Gastroenterology

## 2016-01-27 ENCOUNTER — Encounter (HOSPITAL_COMMUNITY)
Admission: RE | Disposition: A | Discharge status: Home / Self Care | Payer: Self-pay | Source: Ambulatory Visit | Attending: Gastroenterology

## 2016-01-27 DIAGNOSIS — K2980 Duodenitis without bleeding: Secondary | ICD-10-CM

## 2016-01-27 DIAGNOSIS — R1310 Dysphagia, unspecified: Secondary | ICD-10-CM | POA: Diagnosis not present

## 2016-01-27 DIAGNOSIS — K317 Polyp of stomach and duodenum: Secondary | ICD-10-CM | POA: Diagnosis not present

## 2016-01-27 DIAGNOSIS — K222 Esophageal obstruction: Secondary | ICD-10-CM

## 2016-01-27 DIAGNOSIS — Q438 Other specified congenital malformations of intestine: Secondary | ICD-10-CM | POA: Insufficient documentation

## 2016-01-27 DIAGNOSIS — F329 Major depressive disorder, single episode, unspecified: Secondary | ICD-10-CM | POA: Insufficient documentation

## 2016-01-27 DIAGNOSIS — K644 Residual hemorrhoidal skin tags: Secondary | ICD-10-CM | POA: Insufficient documentation

## 2016-01-27 DIAGNOSIS — Z79899 Other long term (current) drug therapy: Secondary | ICD-10-CM | POA: Diagnosis not present

## 2016-01-27 DIAGNOSIS — Z1212 Encounter for screening for malignant neoplasm of rectum: Secondary | ICD-10-CM | POA: Diagnosis not present

## 2016-01-27 DIAGNOSIS — K648 Other hemorrhoids: Secondary | ICD-10-CM | POA: Insufficient documentation

## 2016-01-27 DIAGNOSIS — F419 Anxiety disorder, unspecified: Secondary | ICD-10-CM | POA: Diagnosis not present

## 2016-01-27 DIAGNOSIS — Z1211 Encounter for screening for malignant neoplasm of colon: Secondary | ICD-10-CM | POA: Diagnosis not present

## 2016-01-27 DIAGNOSIS — K5730 Diverticulosis of large intestine without perforation or abscess without bleeding: Secondary | ICD-10-CM | POA: Diagnosis not present

## 2016-01-27 HISTORY — DX: Other complications of anesthesia, initial encounter: T88.59XA

## 2016-01-27 HISTORY — PX: SAVORY DILATION: SHX5439

## 2016-01-27 HISTORY — DX: Adverse effect of unspecified anesthetic, initial encounter: T41.45XA

## 2016-01-27 HISTORY — PX: COLONOSCOPY: SHX5424

## 2016-01-27 HISTORY — PX: ESOPHAGOGASTRODUODENOSCOPY: SHX5428

## 2016-01-27 SURGERY — COLONOSCOPY
Anesthesia: Moderate Sedation

## 2016-01-27 MED ORDER — MEPERIDINE HCL 100 MG/ML IJ SOLN
INTRAMUSCULAR | Status: DC | PRN
  Administered 2016-01-27 (×2): 25 mg via INTRAVENOUS
  Administered 2016-01-27: 50 mg via INTRAVENOUS

## 2016-01-27 MED ORDER — LIDOCAINE VISCOUS 2 % MT SOLN
OROMUCOSAL | Status: DC | PRN
  Administered 2016-01-27: 1 via OROMUCOSAL

## 2016-01-27 MED ORDER — PANTOPRAZOLE SODIUM 40 MG PO TBEC: DELAYED_RELEASE_TABLET | ORAL | 11 refills | Status: DC

## 2016-01-27 MED ORDER — LIDOCAINE HCL 2 % EX GEL
CUTANEOUS | Status: DC | PRN
  Administered 2016-01-27: 1

## 2016-01-27 MED ORDER — MIDAZOLAM HCL 5 MG/5ML IJ SOLN
INTRAMUSCULAR | Status: AC
  Filled 2016-01-27: qty 10

## 2016-01-27 MED ORDER — STERILE WATER FOR IRRIGATION IR SOLN
Status: DC | PRN
  Administered 2016-01-27: 2.5 mL

## 2016-01-27 MED ORDER — MIDAZOLAM HCL 5 MG/5ML IJ SOLN
INTRAMUSCULAR | Status: DC | PRN
  Administered 2016-01-27: 1 mg via INTRAVENOUS
  Administered 2016-01-27 (×3): 2 mg via INTRAVENOUS

## 2016-01-27 MED ORDER — LIDOCAINE VISCOUS 2 % MT SOLN
OROMUCOSAL | Status: AC
  Filled 2016-01-27: qty 15

## 2016-01-27 MED ORDER — PROMETHAZINE HCL 25 MG/ML IJ SOLN
INTRAMUSCULAR | Status: AC
  Filled 2016-01-27: qty 1

## 2016-01-27 MED ORDER — LIDOCAINE HCL 2 % EX GEL
CUTANEOUS | Status: AC
  Filled 2016-01-27: qty 30

## 2016-01-27 MED ORDER — PROMETHAZINE HCL 25 MG/ML IJ SOLN
25.0000 mg | Freq: Once | INTRAMUSCULAR | Status: AC
  Administered 2016-01-27: 25 mg via INTRAVENOUS

## 2016-01-27 MED ORDER — MINERAL OIL PO OIL
TOPICAL_OIL | ORAL | Status: AC
  Filled 2016-01-27: qty 30

## 2016-01-27 MED ORDER — MEPERIDINE HCL 100 MG/ML IJ SOLN
INTRAMUSCULAR | Status: DC
Start: 2016-01-27 — End: 2016-01-27
  Filled 2016-01-27: qty 2

## 2016-01-27 MED ORDER — SODIUM CHLORIDE 0.9 % IV SOLN
INTRAVENOUS | Status: DC
  Administered 2016-01-27: 07:00:00 via INTRAVENOUS

## 2016-01-27 MED ORDER — SODIUM CHLORIDE 0.9% FLUSH
INTRAVENOUS | Status: AC
  Filled 2016-01-27: qty 10

## 2016-01-27 NOTE — H&P (Signed)
Primary Care Physician:  Vic Blackbird, MD Primary Gastroenterologist:  Dr. Oneida Alar  Pre-Procedure History & Physical: HPI:  [name] is a 50 y.o. female here for DYSPHAGIA/screening   Past Medical History:  Diagnosis Date  . Chronic gastritis   . Complication of anesthesia   . Depression   . Duodenitis 08/2005  . Dysphagia   . Esophageal dilatation     Past Surgical History:  Procedure Laterality Date  . BREAST BIOPSY Right 04/26/2013   Procedure: BREAST BIOPSY TIMES TWO;  Surgeon: Jamesetta So, MD;  Location: [facility];  Service: General;  Laterality: Right;  . BREAST MASS EXCISION     bilateral tumor removal  . DILITATION & CURRETTAGE/HYSTROSCOPY WITH THERMACHOICE ABLATION N/A 10/11/2013   Procedure: DILATATION & CURETTAGE/HYSTEROSCOPY WITH THERMACHOICE ABLATION;  Surgeon: Florian Buff, MD;  Location: [facility];  Service: Gynecology;  Laterality: N/A;  total therapy time- 20 minutes temp  76 deg C  . ESOPHAGEAL DILATION  08/2005  . TUBAL LIGATION    . WISDOM TOOTH EXTRACTION      Prior to Admission medications   Medication Sig Start Date End Date Taking? Authorizing Provider  escitalopram (LEXAPRO) 10 MG tablet Take 1 tablet (10 mg total) by mouth at bedtime. 09/22/15   Alycia Rossetti, MD  phentermine 37.5 MG capsule Take 37.5 mg by mouth every morning.    Historical Provider, MD    Allergies as of 01/18/2016 - Review Complete 01/15/2016  Allergen Reaction Noted  . Lisinopril Cough 04/24/2013    Family History  Problem Relation Age of Onset  . Hypertension Father   . Cancer Paternal Aunt     colon  . Cancer Paternal Uncle     colon  . Cancer Cousin     colon    Social History   Social History  . Marital status: Married    Spouse name: N/A  . Number of children: N/A  . Years of education: N/A   Occupational History  . Full time [employer]   Social History Main Topics  . Smoking status: Never Smoker  . Smokeless tobacco: Never Used  .  Alcohol use No  . Drug use: No  . Sexual activity: Yes    Birth control/ protection: Surgical   Other Topics Concern  . Not on file   Social History Narrative   No regular exercise    Review of Systems: See HPI, otherwise negative ROS   Physical Exam: BP 126/71   Pulse 83   Temp 99.1 F (37.3 C) (Oral)   Resp (!) 22   Ht 5\' 2"  (1.575 m)   Wt 196 lb (88.9 kg)   LMP 01/01/2016   SpO2 99%   BMI 35.85 kg/m  General:   Alert,  pleasant and cooperative in NAD Head:  Normocephalic and atraumatic. Neck:  Supple; Lungs:  Clear throughout to auscultation.    Heart:  Regular rate and rhythm. Abdomen:  Soft, nontender and nondistended. Normal bowel sounds, without guarding, and without rebound.   Neurologic:  Alert and  oriented x4;  grossly normal neurologically.  Impression/Plan:     DYSPHAGIA/screening  PLAN:  EGD/DIL/tcs TODAY

## 2016-01-27 NOTE — Op Note (Signed)
[facility] Patient Name: [name] Procedure Date: 01/27/2016 8:29 AM MRN: [id number] Date of Birth: 1965/03/05 Attending MD: Barney Drain , MD CSN: [id number] Age: 50 Admit Type: Outpatient Procedure:                Colonoscopy, SCREENING Indications:              Screening for colorectal malignant neoplasm Providers:                Barney Drain, MD, Lurline Del, RN, Isabella Stalling,                            Technician Referring MD:             Modena Nunnery. Adeline Medicines:                Promethazine 25 mg IV, Meperidine 75 mg IV,                            Midazolam 5 mg IV Complications:            No immediate complications. Estimated Blood Loss:     Estimated blood loss: none. Procedure:                Pre-Anesthesia Assessment:                           - Prior to the procedure, a History and Physical                            was performed, and patient medications and                            allergies were reviewed. The patient's tolerance of                            previous anesthesia was also reviewed. The risks                            and benefits of the procedure and the sedation                            options and risks were discussed with the patient.                            All questions were answered, and informed consent                            was obtained. Prior Anticoagulants: The patient has                            taken no previous anticoagulant or antiplatelet                            agents. ASA Grade Assessment: II - A patient with  mild systemic disease. After reviewing the risks                            and benefits, the patient was deemed in                            satisfactory condition to undergo the procedure.                            After obtaining informed consent, the colonoscope                            was passed under direct vision. Throughout the   procedure, the patient's blood pressure, pulse, and                            oxygen saturations were monitored continuously. The                            [id number] scope was introduced through                            the anus and advanced to the the terminal ileum.                            The terminal ileum, ileocecal valve, appendiceal                            orifice, and rectum were photographed. The                            colonoscopy was somewhat difficult due to a                            tortuous colon. Successful completion of the                            procedure was aided by increasing the dose of                            sedation medication and COLOWRAP. The patient                            tolerated the procedure fairly well. The quality of                            the bowel preparation was excellent. Scope In: 9:12:00 AM Scope Out: 9:26:32 AM Scope Withdrawal Time: 0 hours 8 minutes 11 seconds  Total Procedure Duration: 0 hours 14 minutes 32 seconds  Findings:      A few small and large-mouthed diverticula were found in the       recto-sigmoid colon and sigmoid colon.      The recto-sigmoid colon, sigmoid colon and descending colon were       moderately redundant.  External and internal hemorrhoids were found. Impression:               - Diverticulosis in the recto-sigmoid colon and in                            the sigmoid colon.                           - Redundant colon.                           - External and internal hemorrhoids. Moderate Sedation:      Moderate (conscious) sedation was administered by the endoscopy nurse       and supervised by the endoscopist. The following parameters were       monitored: oxygen saturation, heart rate, blood pressure, and response       to care. Total physician intraservice time was 43 minutes. Recommendation:           - High fiber diet.                           - Continue present  medications.                           - Repeat colonoscopy in 10 years for surveillance.                           - Patient has a contact number available for                            emergencies. The signs and symptoms of potential                            delayed complications were discussed with the                            patient. Return to normal activities tomorrow.                            Written discharge instructions were provided to the                            patient. Procedure Code(s):        --- Professional ---                           [phone], Colonoscopy, flexible; diagnostic, including                            collection of specimen(s) by brushing or washing,                            when performed (separate procedure)                           99152, Moderate sedation services provided by the  same physician or other qualified health care                            professional performing the diagnostic or                            therapeutic service that the sedation supports,                            requiring the presence of an independent trained                            observer to assist in the monitoring of the                            patient's level of consciousness and physiological                            status; initial 15 minutes of intraservice time,                            patient age 67 years or older                           [phone], Moderate sedation services; each additional                            15 minutes intraservice time                           99153, Moderate sedation services; each additional                            15 minutes intraservice time Diagnosis Code(s):        --- Professional ---                           Z12.11, Encounter for screening for malignant                            neoplasm of colon                           K64.8, Other hemorrhoids                            K57.30, Diverticulosis of large intestine without                            perforation or abscess without bleeding                           Q43.8, Other specified congenital malformations of                            intestine CPT copyright 2016 American Medical  Association. All rights reserved. The codes documented in this report are preliminary and upon coder review may  be revised to meet current compliance requirements. Barney Drain, MD Barney Drain, MD 01/27/2016 9:54:00 AM This report has been signed electronically. Number of Addenda: 0

## 2016-01-27 NOTE — Op Note (Signed)
[facility] Patient Name: [name] Procedure Date: 01/27/2016 9:27 AM MRN: [id number] Date of Birth: 05-Jun-1965 Attending MD: Barney Drain , MD CSN: [id number] Age: 50 Admit Type: Outpatient Procedure:                Upper GI endoscopy WITH ESOPHAGEAL DILATION Indications:              Dysphagia Providers:                Barney Drain, MD, Lurline Del, RN, Isabella Stalling,                            Technician Referring MD:              Medicines:                Midazolam 2 mg IV, Meperidine 25 mg IV Complications:            No immediate complications. Estimated Blood Loss:     Estimated blood loss was minimal. Procedure:                Pre-Anesthesia Assessment:                           - Prior to the procedure, a History and Physical                            was performed, and patient medications and                            allergies were reviewed. The patient's tolerance of                            previous anesthesia was also reviewed. The risks                            and benefits of the procedure and the sedation                            options and risks were discussed with the patient.                            All questions were answered, and informed consent                            was obtained. Prior Anticoagulants: The patient has                            taken no previous anticoagulant or antiplatelet                            agents. ASA Grade Assessment: II - A patient with                            mild systemic disease. After reviewing the risks  and benefits, the patient was deemed in                            satisfactory condition to undergo the procedure.                           After obtaining informed consent, the endoscope was                            passed under direct vision. Throughout the                            procedure, the patient's blood pressure, pulse, and   oxygen saturations were monitored continuously. The                            [id number]) was introduced through the                            mouth, and advanced to the second part of duodenum.                            The upper GI endoscopy was somewhat difficult due                            to the patient's anxiety. Successful completion of                            the procedure was aided by increasing the dose of                            sedation medication. The patient tolerated the                            procedure fairly well. Scope In: 9:34:14 AM Scope Out: 9:42:31 AM Total Procedure Duration: 0 hours 8 minutes 17 seconds  Findings:      One moderate (circumferential scarring or stenosis; an endoscope may       pass) benign-appearing, intrinsic stenosis was found. This measured 1.2       cm (inner diameter) and was traversed. A guidewire was placed and the       scope was withdrawn. Dilation was performed with a Savary dilator with       mild resistance at 12.8 mm and moderate resistance at 14 mm, 15 mm and       16 mm. Estimated blood loss was minimal.      Multiple sessile polyps with no bleeding and no stigmata of recent       bleeding were found in the gastric body.      Patchy moderate inflammation characterized by congestion (edema) and       erythema was found in the duodenal bulb. Impression:               - Benign-appearing esophageal stenosis. Dilated.                           -  Multiple gastric polyps.                           - MILD Duodenitis. Moderate Sedation:      Moderate (conscious) sedation was administered by the endoscopy nurse       and supervised by the endoscopist. The following parameters were       monitored: oxygen saturation, heart rate, blood pressure, and response       to care. Total physician intraservice time was 43 minutes. Recommendation:           - High fiber diet.                           - Use Protonix (pantoprazole)  40 mg PO daily.                           - Return to my office in 4 months.                           - Patient has a contact number available for                            emergencies. The signs and symptoms of potential                            delayed complications were discussed with the                            patient. Return to normal activities tomorrow.                            Written discharge instructions were provided to the                            patient.                           - Continue present medications. Procedure Code(s):        --- Professional ---                           [phone], Esophagogastroduodenoscopy, flexible,                            transoral; with insertion of guide wire followed by                            passage of dilator(s) through esophagus over guide                            wire                           99152, Moderate sedation services provided by the  same physician or other qualified health care                            professional performing the diagnostic or                            therapeutic service that the sedation supports,                            requiring the presence of an independent trained                            observer to assist in the monitoring of the                            patient's level of consciousness and physiological                            status; initial 15 minutes of intraservice time,                            patient age 24 years or older                           [phone], Moderate sedation services; each additional                            15 minutes intraservice time                           99153, Moderate sedation services; each additional                            15 minutes intraservice time Diagnosis Code(s):        --- Professional ---                           K22.2, Esophageal obstruction                           K31.7, Polyp of stomach  and duodenum                           K29.80, Duodenitis without bleeding                           R13.10, Dysphagia, unspecified CPT copyright 2016 American Medical Association. All rights reserved. The codes documented in this report are preliminary and upon coder review may  be revised to meet current compliance requirements. Barney Drain, MD Barney Drain, MD 01/27/2016 10:53:50 AM This report has been signed electronically. Number of Addenda: 0

## 2016-01-27 NOTE — Discharge Instructions (Signed)
You have SMALL internal hemorrhoids & diverticulosis IN YOUR LEFT COLON. You have BENIGN STOMACH POLYPS AND A STRICTURE DUE TO ACID REFLUX. YOU HAD AN EXCELLENT PREP. YOU HAVE A FLOPPY LEFT COLON.   CONTINUE YOUR WEIGHT LOSS EFFORTS.  DRINK WATER TO KEEP YOUR URINE LIGHT YELLOW.  FOLLOW A HIGH FIBER/LOW FAT DIET. AVOID ITEMS THAT CAUSE BLOATING. SEE INFO BELOW.  START PROTONIX. TAKE 30 MINUTES PRIOR TO BREAKFAST.  USE PREPARATION H FOUR TIMES  A DAY IF NEEDED TO RELIEVE RECTAL PAIN/PRESSURE/BLEEDING. USE VISCOUS LIDOCAINE IF NEEDED FOR RECTAL PAIN.  FOLLOW UP IN 4 MOS.   Next colonoscopy in 10 years.    ENDOSCOPY Care After Read the instructions outlined below and refer to this sheet in the next week. These discharge instructions provide you with general information on caring for yourself after you leave the hospital. While your treatment has been planned according to the most current medical practices available, unavoidable complications occasionally occur. If you have any problems or questions after discharge, call DR. Porsche Noguchi, [phone].  ACTIVITY  You may resume your regular activity, but move at a slower pace for the next 24 hours.   Take frequent rest periods for the next 24 hours.   Walking will help get rid of the air and reduce the bloated feeling in your belly (abdomen).   No driving for 24 hours (because of the medicine (anesthesia) used during the test).   You may shower.   Do not sign any important legal documents or operate any machinery for 24 hours (because of the anesthesia used during the test).    NUTRITION  Drink plenty of fluids.   You may resume your normal diet as instructed by your doctor.   Begin with a light meal and progress to your normal diet. Heavy or fried foods are harder to digest and may make you feel sick to your stomach (nauseated).   Avoid alcoholic beverages for 24 hours or as instructed.    MEDICATIONS  You may resume your  normal medications.   WHAT YOU CAN EXPECT TODAY  Some feelings of bloating in the abdomen.   Passage of more gas than usual.   Spotting of blood in your stool or on the toilet paper  .  IF YOU HAD POLYPS REMOVED DURING THE ENDOSCOPY:  Eat a soft diet IF YOU HAVE NAUSEA, BLOATING, ABDOMINAL PAIN, OR VOMITING.    FINDING OUT THE RESULTS OF YOUR TEST Not all test results are available during your visit. DR. Oneida Alar WILL CALL YOU WITHIN 7 DAYS OF YOUR PROCEDUE WITH YOUR RESULTS. Do not assume everything is normal if you have not heard from DR. Kairee Kozma IN ONE WEEK, CALL HER OFFICE AT [phone].  SEEK IMMEDIATE MEDICAL ATTENTION AND CALL THE OFFICE: [phone] IF:  You have more than a spotting of blood in your stool.   Your belly is swollen (abdominal distention).   You are nauseated or vomiting.   You have a temperature over 101F.   You have abdominal pain or discomfort that is severe or gets worse throughout the day.   Gastritis  Gastritis is an inflammation (the body's way of reacting to injury and/or infection) of the stomach. It is often caused by viral or bacterial (germ) infections. It can also be caused BY ASPIRIN, BC/GOODY POWDER'S, (IBUPROFEN) MOTRIN, OR ALEVE (NAPROXEN), chemicals (including alcohol), SPICY FOODS, and medications. This illness may be associated with generalized malaise (feeling tired, not well), UPPER ABDOMINAL STOMACH cramps, and fever. One common bacterial  cause of gastritis is an organism known as H. Pylori. This can be treated with antibiotics.    High-Fiber Diet A high-fiber diet changes your normal diet to include more whole grains, legumes, fruits, and vegetables. Changes in the diet involve replacing refined carbohydrates with unrefined foods. The calorie level of the diet is essentially unchanged. The Dietary Reference Intake (recommended amount) for adult males is 38 grams per day. For adult females, it is 25 grams per day. Pregnant and  lactating women should consume 28 grams of fiber per day. Fiber is the intact part of a plant that is not broken down during digestion. Functional fiber is fiber that has been isolated from the plant to provide a beneficial effect in the body. PURPOSE  Increase stool bulk.   Ease and regulate bowel movements.   Lower cholesterol.   REDUCE RISK OF COLON CANCER  INDICATIONS THAT YOU NEED MORE FIBER  Constipation and hemorrhoids.   Uncomplicated diverticulosis (intestine condition) and irritable bowel syndrome.   Weight management.   As a protective measure against hardening of the arteries (atherosclerosis), diabetes, and cancer.   GUIDELINES FOR INCREASING FIBER IN THE DIET  Start adding fiber to the diet slowly. A gradual increase of about 5 more grams (2 slices of whole-wheat bread, 2 servings of most fruits or vegetables, or 1 bowl of high-fiber cereal) per day is best. Too rapid an increase in fiber may result in constipation, flatulence, and bloating.   Drink enough water and fluids to keep your urine clear or pale yellow. Water, juice, or caffeine-free drinks are recommended. Not drinking enough fluid may cause constipation.   Eat a variety of high-fiber foods rather than one type of fiber.   Try to increase your intake of fiber through using high-fiber foods rather than fiber pills or supplements that contain small amounts of fiber.   The goal is to change the types of food eaten. Do not supplement your present diet with high-fiber foods, but replace foods in your present diet.   INCLUDE A VARIETY OF FIBER SOURCES  Replace refined and processed grains with whole grains, canned fruits with fresh fruits, and incorporate other fiber sources. White rice, white breads, and most bakery goods contain little or no fiber.   Brown whole-grain rice, buckwheat oats, and many fruits and vegetables are all good sources of fiber. These include: broccoli, Brussels sprouts, cabbage,  cauliflower, beets, sweet potatoes, white potatoes (skin on), carrots, tomatoes, eggplant, squash, berries, fresh fruits, and dried fruits.   Cereals appear to be the richest source of fiber. Cereal fiber is found in whole grains and bran. Bran is the fiber-rich outer coat of cereal grain, which is largely removed in refining. In whole-grain cereals, the bran remains. In breakfast cereals, the largest amount of fiber is found in those with "bran" in their names. The fiber content is sometimes indicated on the label.   You may need to include additional fruits and vegetables each day.   In baking, for 1 cup white flour, you may use the following substitutions:   1 cup whole-wheat flour minus 2 tablespoons.   1/2 cup white flour plus 1/2 cup whole-wheat flour.   Low-Fat Diet BREADS, CEREALS, PASTA, RICE, DRIED PEAS, AND BEANS These products are high in carbohydrates and most are low in fat. Therefore, they can be increased in the diet as substitutes for fatty foods. They too, however, contain calories and should not be eaten in excess. Cereals can be eaten for snacks as  well as for breakfast.  Include foods that contain fiber (fruits, vegetables, whole grains, and legumes). Research shows that fiber may lower blood cholesterol levels, especially the water-soluble fiber found in fruits, vegetables, oat products, and legumes. FRUITS AND VEGETABLES It is good to eat fruits and vegetables. Besides being sources of fiber, both are rich in vitamins and some minerals. They help you get the daily allowances of these nutrients. Fruits and vegetables can be used for snacks and desserts. MEATS Limit lean meat, chicken, [place], and fish to no more than 6 ounces per day. Beef, Pork, and Lamb Use lean cuts of beef, pork, and lamb. Lean cuts include:  Extra-lean ground beef.  Arm roast.  Sirloin tip.  Center-cut ham.  Round steak.  Loin chops.  Rump roast.  Tenderloin.  Trim all fat off the outside of  meats before cooking. It is not necessary to severely decrease the intake of red meat, but lean choices should be made. Lean meat is rich in protein and contains a highly absorbable form of iron. Premenopausal women, in particular, should avoid reducing lean red meat because this could increase the risk for low red blood cells (iron-deficiency anemia). The organ meats, such as liver, sweetbreads, kidneys, and brain are very rich in cholesterol. They should be limited. Chicken and [place] These are good sources of protein. The fat of poultry can be reduced by removing the skin and underlying fat layers before cooking. Chicken and [place] can be substituted for lean red meat in the diet. Poultry should not be fried or covered with high-fat sauces. Fish and Shellfish Fish is a good source of protein. Shellfish contain cholesterol, but they usually are low in saturated fatty acids. The preparation of fish is important. Like chicken and [place], they should not be fried or covered with high-fat sauces. EGGS Egg whites contain no fat or cholesterol. They can be eaten often. Try 1 to 2 egg whites instead of whole eggs in recipes or use egg substitutes that do not contain yolk. MILK AND DAIRY PRODUCTS Use skim or 1% milk instead of 2% or whole milk. Decrease whole milk, natural, and processed cheeses. Use nonfat or low-fat (2%) cottage cheese or low-fat cheeses made from vegetable oils. Choose nonfat or low-fat (1 to 2%) yogurt. Experiment with evaporated skim milk in recipes that call for heavy cream. Substitute low-fat yogurt or low-fat cottage cheese for sour cream in dips and salad dressings. Have at least 2 servings of low-fat dairy products, such as 2 glasses of skim (or 1%) milk each day to help get your daily calcium intake.  FATS AND OILS Reduce the total intake of fats, especially saturated fat. Butterfat, lard, and beef fats are high in saturated fat and cholesterol. These should be avoided as much as  possible. Vegetable fats do not contain cholesterol, but certain vegetable fats, such as coconut oil, palm oil, and palm kernel oil are very high in saturated fats. These should be limited. These fats are often used in bakery goods, processed foods, popcorn, oils, and nondairy creamers. Vegetable shortenings and some peanut butters contain hydrogenated oils, which are also saturated fats. Read the labels on these foods and check for saturated vegetable oils. Unsaturated vegetable oils and fats do not raise blood cholesterol. However, they should be limited because they are fats and are high in calories. Total fat should still be limited to 30% of your daily caloric intake. Desirable liquid vegetable oils are corn oil, cottonseed oil, olive oil, canola oil,  safflower oil, soybean oil, and sunflower oil. Peanut oil is not as good, but small amounts are acceptable. Buy a heart-healthy tub margarine that has no partially hydrogenated oils in the ingredients. Mayonnaise and salad dressings often are made from unsaturated fats, but they should also be limited because of their high calorie and fat content. Seeds, nuts, peanut butter, olives, and avocados are high in fat, but the fat is mainly the unsaturated type. These foods should be limited mainly to avoid excess calories and fat. OTHER EATING TIPS Snacks  Most sweets should be limited as snacks. They tend to be rich in calories and fats, and their caloric content outweighs their nutritional value. Some good choices in snacks are graham crackers, melba toast, soda crackers, bagels (no egg), English muffins, fruits, and vegetables. These snacks are preferable to snack crackers, [place] fries, and chips. Popcorn should be air-popped or cooked in small amounts of liquid vegetable oil. Desserts Eat fruit, low-fat yogurt, and fruit ices. AVOID pastries, cake, and cookies. Sherbet, angel food cake, gelatin dessert, frozen low-fat yogurt, or other frozen products that do  not contain saturated fat (pure fruit juice bars, frozen ice pops) are also acceptable.  COOKING METHODS Choose those methods that use little or no fat. They include: Poaching.  Braising.  Steaming.  Grilling.  Baking.  Stir-frying.  Broiling.  Microwaving.  Foods can be cooked in a nonstick pan without added fat, or use a nonfat cooking spray in regular cookware. Limit fried foods and avoid frying in saturated fat. Add moisture to lean meats by using water, broth, cooking wines, and other nonfat or low-fat sauces along with the cooking methods mentioned above. Soups and stews should be chilled after cooking. The fat that forms on top after a few hours in the refrigerator should be skimmed off. When preparing meals, avoid using excess salt. Salt can contribute to raising blood pressure in some people. EATING AWAY FROM HOME Order entres, potatoes, and vegetables without sauces or butter. When meat exceeds the size of a deck of cards (3 to 4 ounces), the rest can be taken home for another meal. Choose vegetable or fruit salads and ask for low-calorie salad dressings to be served on the side. Use dressings sparingly. Limit high-fat toppings, such as bacon, crumbled eggs, cheese, sunflower seeds, and olives. Ask for heart-healthy tub margarine instead of butter.   Diverticulosis Diverticulosis is a common condition that develops when small pouches (diverticula) form in the wall of the colon. The risk of diverticulosis increases with age. It happens more often in people who eat a low-fiber diet. Most individuals with diverticulosis have no symptoms. Those individuals with symptoms usually experience belly (abdominal) pain, constipation, or loose stools (diarrhea).  HOME CARE INSTRUCTIONS  Increase the amount of fiber in your diet as directed by your caregiver or dietician. This may reduce symptoms of diverticulosis.   Drink at least 6 to 8 glasses of water each day to prevent constipation.    Try not to strain when you have a bowel movement.   Avoiding nuts and seeds to prevent complications is NOT NECESSARY.   FOODS HAVING HIGH FIBER CONTENT INCLUDE:  Fruits. Apple, peach, pear, tangerine, raisins, prunes.   Vegetables. Brussels sprouts, asparagus, broccoli, cabbage, carrot, cauliflower, romaine lettuce, spinach, summer squash, tomato, winter squash, zucchini.   Starchy Vegetables. Baked beans, kidney beans, lima beans, split peas, lentils, potatoes (with skin).   Grains. Whole wheat bread, brown rice, bran flake cereal, plain oatmeal, white rice, shredded wheat,  bran muffins.    SEEK IMMEDIATE MEDICAL CARE IF:  You develop increasing pain or severe bloating.   You have an oral temperature above 101F.   You develop vomiting or bowel movements that are bloody or black.    Hemorrhoids Hemorrhoids are dilated (enlarged) veins around the rectum. Sometimes clots will form in the veins. This makes them swollen and painful. These are called thrombosed hemorrhoids. Causes of hemorrhoids include:  Constipation.   Straining to have a bowel movement.   HEAVY LIFTING  HOME CARE INSTRUCTIONS  Eat a well balanced diet and drink 6 to 8 glasses of water every day to avoid constipation. You may also use a bulk laxative.   Avoid straining to have bowel movements.   Keep anal area dry and clean.   Do not use a donut shaped pillow or sit on the toilet for long periods. This increases blood pooling and pain.   Move your bowels when your body has the urge; this will require less straining and will decrease pain and pressure.

## 2016-01-28 ENCOUNTER — Encounter (HOSPITAL_COMMUNITY): Payer: Self-pay | Admitting: Gastroenterology

## 2016-02-12 ENCOUNTER — Encounter: Payer: Self-pay | Admitting: Family Medicine

## 2016-02-12 ENCOUNTER — Ambulatory Visit (INDEPENDENT_AMBULATORY_CARE_PROVIDER_SITE_OTHER): Payer: 59 | Admitting: Family Medicine

## 2016-02-12 VITALS — BP 134/88 | HR 80 | Temp 97.8°F | Resp 18 | Ht 62.0 in | Wt 198.8 lb

## 2016-02-12 DIAGNOSIS — I10 Essential (primary) hypertension: Secondary | ICD-10-CM

## 2016-02-12 DIAGNOSIS — E6609 Other obesity due to excess calories: Secondary | ICD-10-CM | POA: Diagnosis not present

## 2016-02-12 DIAGNOSIS — Z6836 Body mass index (BMI) 36.0-36.9, adult: Secondary | ICD-10-CM | POA: Diagnosis not present

## 2016-02-12 DIAGNOSIS — K219 Gastro-esophageal reflux disease without esophagitis: Secondary | ICD-10-CM | POA: Diagnosis not present

## 2016-02-12 DIAGNOSIS — IMO0001 Reserved for inherently not codable concepts without codable children: Secondary | ICD-10-CM

## 2016-02-12 NOTE — Progress Notes (Signed)
Subjective:    Patient ID: [name], female    DOB: February 20, 1965, 51 y.o.   MRN: [id number]  Patient presents for routine follow up (discuss gastric surg for weight) Patient to discuss bariatric surgery. She is interesting bariatric surgery secondary to difficulty losing weight. She's been working on her weight loss over the past 2-3 years and has not been able to sustain any significant weight loss. Her heaviest weight 201 pounds which was this year she was able to get down 169 pounds when she was at a weight loss clinic in the past but awakening right back. She has recently been with [facility] Dr[facility] Marianna Payment weight loss clinic and has been on phentermine but has not lost any significant weight in the past she has been on Topamax she's been on B-12 injection she has done Weight Watchers and other nutrition systems. She's also been working with a nutritionist and still not been able to keep off any weight. With the latter she has lost 5-10 pounds but then regained it back over the few months. Her exercise [place] at least 30 minutes 5 days a week. She has history of underlying hypertension as well as reflux she had a recent EGD which showed gastritis and she is currently on Protonix. She knows when her weight increases her reflux symptoms do worsen.  With regards to her depression and anxiety she came off the Lexapro as it was making her yawn a lot she feels like she is done okay off of this the past month.    Review Of Systems:  GEN- denies fatigue, fever, weight loss,weakness, recent illness HEENT- denies eye drainage, change in vision, nasal discharge, CVS- denies chest pain, palpitations RESP- denies SOB, cough, wheeze ABD- denies N/V, change in stools, abd pain GU- denies dysuria, hematuria, dribbling, incontinence MSK- denies joint pain, muscle aches, injury Neuro- denies headache, dizziness, syncope, seizure activity       Objective:    BP 134/88   Pulse 80    Temp 97.8 F (36.6 C)   Resp 18   Ht 5\' 2"  (1.575 m)   Wt 198 lb 12.8 oz (90.2 kg)   LMP 01/01/2016   BMI 36.36 kg/m  GEN- NAD, alert and oriented x3 HEENT- PERRL, EOMI, non injected sclera, pink conjunctiva, MMM, oropharynx clear Neck- Supple, no thyromegaly CVS- RRR, no murmur RESP-CTAB Psych- normal affect and mood  EXT- No edema Pulses- Radial, DP- 2+        Assessment & Plan:      Problem List Items Addressed This Visit    Obesity    Obesity in setting of know reflux, depression history and hypertension I think she would be a good candidate for weight loss surgery. We refer to bariatric clinic. Discussed attending a seminar She is out of phentermine, no benefit from this medication, recommend she discontinue and continue her portion control and diet per nutrition recommendations I will obtain records from her previous weight loss clinic,also providing documentation of her weight loss efforts  Blood pressure looks okay today, continue to monitor without medications   Will have her get some baseline labs, including fasting glucose      Relevant Orders   CBC with Differential/Platelet   Comprehensive metabolic panel   Lipid panel   Essential hypertension, benign   Relevant Orders   CBC with Differential/Platelet   Comprehensive metabolic panel   Lipid panel   TSH   ESOPHAGEAL REFLUX - Primary  Note: This dictation was prepared with Dragon dictation along with smaller phrase technology. Any transcriptional errors that result from this process are unintentional.

## 2016-02-12 NOTE — Patient Instructions (Addendum)
Register for the seminar - [email or web address] ( Bariatric Program) Release of records [facility]- Dr. Marianna Payment  Start taking multivitamin Referral to bariatric clinic F/U pending results

## 2016-02-13 ENCOUNTER — Encounter: Payer: Self-pay | Admitting: Family Medicine

## 2016-02-13 NOTE — Assessment & Plan Note (Signed)
Obesity in setting of know reflux, depression history and hypertension I think she would be a good candidate for weight loss surgery. We refer to bariatric clinic. Discussed attending a seminar She is out of phentermine, no benefit from this medication, recommend she discontinue and continue her portion control and diet per nutrition recommendations I will obtain records from her previous weight loss clinic,also providing documentation of her weight loss efforts  Blood pressure looks okay today, continue to monitor without medications   Will have her get some baseline labs, including fasting glucose

## 2016-02-27 ENCOUNTER — Other Ambulatory Visit: Payer: Self-pay | Admitting: Family Medicine

## 2016-02-27 DIAGNOSIS — I10 Essential (primary) hypertension: Secondary | ICD-10-CM | POA: Diagnosis not present

## 2016-02-27 DIAGNOSIS — R7309 Other abnormal glucose: Secondary | ICD-10-CM | POA: Diagnosis not present

## 2016-02-27 DIAGNOSIS — E6609 Other obesity due to excess calories: Secondary | ICD-10-CM | POA: Diagnosis not present

## 2016-02-27 DIAGNOSIS — Z6836 Body mass index (BMI) 36.0-36.9, adult: Secondary | ICD-10-CM | POA: Diagnosis not present

## 2016-02-27 LAB — CBC WITH DIFFERENTIAL/PLATELET
BASOS PCT: 0 %
Basophils Absolute: 0 cells/uL (ref 0–200)
Eosinophils Absolute: 71 cells/uL (ref 15–500)
Eosinophils Relative: 1 %
HEMATOCRIT: 41.2 % (ref 35.0–45.0)
HEMOGLOBIN: 13.8 g/dL (ref 12.0–15.0)
LYMPHS ABS: 2272 {cells}/uL (ref 850–3900)
LYMPHS PCT: 32 %
MCH: 28.8 pg (ref 27.0–33.0)
MCHC: 33.5 g/dL (ref 32.0–36.0)
MCV: 85.8 fL (ref 80.0–100.0)
MONO ABS: 497 {cells}/uL (ref 200–950)
MPV: 9.8 fL (ref 7.5–12.5)
Monocytes Relative: 7 %
NEUTROS ABS: 4260 {cells}/uL (ref 1500–7800)
Neutrophils Relative %: 60 %
Platelets: 268 10*3/uL (ref 140–400)
RBC: 4.8 MIL/uL (ref 3.80–5.10)
RDW: 14.9 % (ref 11.0–15.0)
WBC: 7.1 10*3/uL (ref 3.8–10.8)

## 2016-02-27 LAB — LIPID PANEL
Cholesterol: 156 mg/dL (ref <200)
HDL: 40 mg/dL — AB (ref >50)
LDL Cholesterol: 97 mg/dL (ref <100)
Total CHOL/HDL Ratio: 3.9 Ratio (ref <5.0)
Triglycerides: 95 mg/dL (ref <150)
VLDL: 19 mg/dL (ref <30)

## 2016-02-27 LAB — COMPREHENSIVE METABOLIC PANEL
ALK PHOS: 64 U/L (ref 33–130)
ALT: 11 U/L (ref 6–29)
AST: 14 U/L (ref 10–35)
Albumin: 4.1 g/dL (ref 3.6–5.1)
BILIRUBIN TOTAL: 0.5 mg/dL (ref 0.2–1.2)
BUN: 13 mg/dL (ref 7–25)
CALCIUM: 8.7 mg/dL (ref 8.6–10.4)
CO2: 24 mmol/L (ref 20–31)
Chloride: 109 mmol/L (ref 98–110)
Creat: 0.65 mg/dL (ref 0.50–1.05)
Glucose, Bld: 104 mg/dL — ABNORMAL HIGH (ref 70–99)
Potassium: 4 mmol/L (ref 3.5–5.3)
Sodium: 141 mmol/L (ref 135–146)
Total Protein: 6.9 g/dL (ref 6.1–8.1)

## 2016-02-27 LAB — TSH: TSH: 1.62 m[IU]/L

## 2016-03-01 ENCOUNTER — Encounter: Payer: Self-pay | Admitting: Family Medicine

## 2016-03-01 LAB — HEMOGLOBIN A1C
HEMOGLOBIN A1C: 5.3 % (ref <5.7)
Mean Plasma Glucose: 105 mg/dL

## 2016-03-15 ENCOUNTER — Encounter: Payer: Self-pay | Admitting: Family Medicine

## 2016-03-21 ENCOUNTER — Encounter: Payer: Self-pay | Admitting: Family Medicine

## 2016-03-21 MED ORDER — BENZONATATE 100 MG PO CAPS: 100.0000 mg | ORAL_CAPSULE | Freq: Three times a day (TID) | ORAL | 0 refills | Status: DC | PRN

## 2016-06-09 ENCOUNTER — Other Ambulatory Visit: Payer: Self-pay | Admitting: Gastroenterology

## 2016-06-09 DIAGNOSIS — B354 Tinea corporis: Secondary | ICD-10-CM

## 2016-06-09 MED ORDER — KETOCONAZOLE 200 MG PO TABS: ORAL_TABLET | ORAL | 0 refills | Status: DC

## 2016-06-09 NOTE — Progress Notes (Signed)
PT REQUESTING REFILL. RX SENT.

## 2016-11-01 ENCOUNTER — Other Ambulatory Visit: Payer: Self-pay | Admitting: Family Medicine

## 2016-11-01 DIAGNOSIS — Z1231 Encounter for screening mammogram for malignant neoplasm of breast: Secondary | ICD-10-CM

## 2016-11-11 ENCOUNTER — Ambulatory Visit (HOSPITAL_COMMUNITY)
Admission: RE | Admit: 2016-11-11 | Discharge: 2016-11-11 | Disposition: A | Discharge status: Home / Self Care | Payer: 59 | Source: Ambulatory Visit | Attending: Family Medicine | Admitting: Family Medicine

## 2016-11-11 DIAGNOSIS — Z1231 Encounter for screening mammogram for malignant neoplasm of breast: Secondary | ICD-10-CM | POA: Diagnosis not present

## 2016-11-11 DIAGNOSIS — N6320 Unspecified lump in the left breast, unspecified quadrant: Secondary | ICD-10-CM | POA: Diagnosis not present

## 2016-11-11 DIAGNOSIS — R928 Other abnormal and inconclusive findings on diagnostic imaging of breast: Secondary | ICD-10-CM | POA: Insufficient documentation

## 2016-11-14 ENCOUNTER — Other Ambulatory Visit: Payer: Self-pay | Admitting: Family Medicine

## 2016-11-14 DIAGNOSIS — R928 Other abnormal and inconclusive findings on diagnostic imaging of breast: Secondary | ICD-10-CM

## 2016-11-15 ENCOUNTER — Ambulatory Visit (HOSPITAL_COMMUNITY)
Admission: RE | Admit: 2016-11-15 | Discharge: 2016-11-15 | Disposition: A | Discharge status: Home / Self Care | Payer: 59 | Source: Ambulatory Visit | Attending: Family Medicine | Admitting: Family Medicine

## 2016-11-15 DIAGNOSIS — N6321 Unspecified lump in the left breast, upper outer quadrant: Secondary | ICD-10-CM | POA: Diagnosis not present

## 2016-11-15 DIAGNOSIS — R928 Other abnormal and inconclusive findings on diagnostic imaging of breast: Secondary | ICD-10-CM | POA: Diagnosis not present

## 2016-11-15 DIAGNOSIS — R922 Inconclusive mammogram: Secondary | ICD-10-CM | POA: Diagnosis not present

## 2016-11-18 ENCOUNTER — Other Ambulatory Visit: Payer: 59 | Admitting: Obstetrics & Gynecology

## 2016-11-23 ENCOUNTER — Encounter: Payer: Self-pay | Admitting: Family Medicine

## 2016-11-23 ENCOUNTER — Ambulatory Visit (INDEPENDENT_AMBULATORY_CARE_PROVIDER_SITE_OTHER): Payer: 59 | Admitting: Family Medicine

## 2016-11-23 VITALS — BP 132/84 | HR 88 | Resp 14 | Ht 62.0 in | Wt 199.0 lb

## 2016-11-23 DIAGNOSIS — D242 Benign neoplasm of left breast: Secondary | ICD-10-CM

## 2016-11-23 DIAGNOSIS — N63 Unspecified lump in unspecified breast: Secondary | ICD-10-CM

## 2016-11-23 DIAGNOSIS — J0100 Acute maxillary sinusitis, unspecified: Secondary | ICD-10-CM

## 2016-11-23 MED ORDER — AMOXICILLIN 875 MG PO TABS: 875.0000 mg | ORAL_TABLET | Freq: Two times a day (BID) | ORAL | 0 refills | Status: DC

## 2016-11-23 MED ORDER — BENZONATATE 100 MG PO CAPS: 100.0000 mg | ORAL_CAPSULE | Freq: Three times a day (TID) | ORAL | 0 refills | Status: DC | PRN

## 2016-11-23 NOTE — Patient Instructions (Signed)
F/u as needed

## 2016-11-23 NOTE — Progress Notes (Signed)
    Subjective:    Patient ID: [name], female    DOB: [birth year], 51 y.o.   MRN: [id number]  Patient presents for Illness (x2 days- nonproductive cough, sneezig, fatigue, malaise)   Missed work yesterday  Cough with mild production, mild sore throat, sinus pressure, drainage, maliase, teeth hurts, ears hurt  Has taken robitussin CF, throat drops, push fluids  Flu shpt done   Started phentermine back in August 206lbs,   Discussed her recnet mammogram, she has probable 1.5cm fibromadenoma on left breast She has had biopsy of right breast before found to be benign After discussing with her family she would like to proceed with biopsyto ensure diagnosis She was given option of biopsy, excision or surveillance with [place] again in 6 months    Review Of Systems:  GEN- denies fatigue, fever, weight loss,weakness, recent illness HEENT- denies eye drainage, change in vision, +nasal discharge, CVS- denies chest pain, palpitations RESP- denies SOB,+ cough, wheeze ABD- denies N/V, change in stools, abd pain GU- denies dysuria, hematuria, dribbling, incontinence MSK- denies joint pain, muscle aches, injury Neuro-+ headache, denies dizziness, syncope, seizure activity       Objective:    BP 132/84   Pulse 88   Resp 14   Ht 5\' 2"  (1.575 m)   Wt 199 lb (90.3 kg)   SpO2 97%   BMI 36.40 kg/m  GEN- NAD, alert and oriented x3 HEENT- PERRL, EOMI, non injected sclera, pink conjunctiva, MMM, oropharynx mild injection, TM clear bilat no effusion,  + maxillary sinus tenderness, inflammed turbinates,  Nasal drainage  Neck- Supple, shotty submandibular LAD CVS- RRR, no murmur RESP-CTAB EXT- No edema Pulses- Radial 2+          Assessment & Plan:      Problem List Items Addressed This Visit    None    Visit Diagnoses    Acute maxillary sinusitis, recurrence not specified    -  Primary   Amox, allegra, can not tolerate nasal sprays,, tessalon perrles   Relevant Medications    amoxicillin (AMOXIL) 875 MG tablet   benzonatate (TESSALON PERLES) 100 MG capsule   Fibroadenoma of left breast       though probably benign fibroadenoma, discussed with pt, will proceed with biopsy   Relevant Orders   Ambulatory referral to Interventional Radiology   Breast nodule       Relevant Orders   Ambulatory referral to Interventional Radiology      Note: This dictation was prepared with Dragon dictation along with smaller phrase technology. Any transcriptional errors that result from this process are unintentional.

## 2016-11-29 ENCOUNTER — Encounter (HOSPITAL_COMMUNITY): Payer: 59

## 2016-12-05 ENCOUNTER — Telehealth: Payer: Self-pay | Admitting: *Deleted

## 2016-12-05 DIAGNOSIS — N6320 Unspecified lump in the left breast, unspecified quadrant: Secondary | ICD-10-CM

## 2016-12-05 NOTE — Telephone Encounter (Signed)
Order was placed IR on 10/25 for the biopsy

## 2016-12-05 NOTE — Telephone Encounter (Signed)
Received call from patient.   Reports that she was in office on 11/23/2016 and was advised to have US guided biopsy of breast, but no order has been placed.   Ok to order at [facility]?

## 2016-12-05 NOTE — Telephone Encounter (Signed)
IR does not do these and order was put in incorrectly.  Correct order has been placed and pt made aware ready to schedule.

## 2016-12-13 ENCOUNTER — Other Ambulatory Visit: Payer: Self-pay | Admitting: Diagnostic Radiology

## 2016-12-13 ENCOUNTER — Other Ambulatory Visit: Payer: Self-pay | Admitting: Family Medicine

## 2016-12-13 ENCOUNTER — Ambulatory Visit (HOSPITAL_COMMUNITY)
Admission: RE | Admit: 2016-12-13 | Discharge: 2016-12-13 | Disposition: A | Discharge status: Home / Self Care | Payer: 59 | Source: Ambulatory Visit | Attending: Family Medicine | Admitting: Family Medicine

## 2016-12-13 DIAGNOSIS — N6320 Unspecified lump in the left breast, unspecified quadrant: Secondary | ICD-10-CM | POA: Diagnosis present

## 2016-12-13 DIAGNOSIS — D242 Benign neoplasm of left breast: Secondary | ICD-10-CM | POA: Diagnosis not present

## 2016-12-13 DIAGNOSIS — R928 Other abnormal and inconclusive findings on diagnostic imaging of breast: Secondary | ICD-10-CM

## 2016-12-13 DIAGNOSIS — N6321 Unspecified lump in the left breast, upper outer quadrant: Secondary | ICD-10-CM | POA: Diagnosis not present

## 2016-12-13 MED ORDER — LIDOCAINE-EPINEPHRINE (PF) 1 %-1:200000 IJ SOLN
INTRAMUSCULAR | Status: AC
  Administered 2016-12-13: 6 mL
  Filled 2016-12-13: qty 30

## 2016-12-13 MED ORDER — LIDOCAINE HCL (PF) 1 % IJ SOLN
INTRAMUSCULAR | Status: AC
  Administered 2016-12-13: 5 mL
  Filled 2016-12-13: qty 5

## 2016-12-13 MED ORDER — SODIUM BICARBONATE 4 % IV SOLN
INTRAVENOUS | Status: AC
  Administered 2016-12-13: 2 mL
  Filled 2016-12-13: qty 5

## 2017-02-27 ENCOUNTER — Encounter: Payer: Self-pay | Admitting: Family Medicine

## 2017-03-31 ENCOUNTER — Encounter: Payer: Self-pay | Admitting: Family Medicine

## 2017-06-08 ENCOUNTER — Telehealth: Payer: Self-pay | Admitting: Gastroenterology

## 2017-06-08 MED ORDER — KETOCONAZOLE 200 MG PO TABS: ORAL_TABLET | ORAL | 1 refills | Status: DC

## 2017-06-08 NOTE — Telephone Encounter (Signed)
PT REQUESTING A REFILL.

## 2017-06-29 ENCOUNTER — Other Ambulatory Visit: Payer: Self-pay | Admitting: *Deleted

## 2017-06-29 DIAGNOSIS — Z0000 Encounter for general adult medical examination without abnormal findings: Secondary | ICD-10-CM

## 2017-06-29 DIAGNOSIS — Z13220 Encounter for screening for lipoid disorders: Secondary | ICD-10-CM

## 2017-06-29 DIAGNOSIS — I10 Essential (primary) hypertension: Secondary | ICD-10-CM

## 2017-07-10 ENCOUNTER — Other Ambulatory Visit: Payer: Self-pay | Admitting: Family Medicine

## 2017-07-10 ENCOUNTER — Other Ambulatory Visit: Payer: 59

## 2017-07-10 DIAGNOSIS — Z0000 Encounter for general adult medical examination without abnormal findings: Secondary | ICD-10-CM | POA: Diagnosis not present

## 2017-07-10 DIAGNOSIS — Z13220 Encounter for screening for lipoid disorders: Secondary | ICD-10-CM | POA: Diagnosis not present

## 2017-07-10 DIAGNOSIS — I10 Essential (primary) hypertension: Secondary | ICD-10-CM | POA: Diagnosis not present

## 2017-07-10 LAB — COMPLETE METABOLIC PANEL WITH GFR
AG Ratio: 1.4 (calc) (ref 1.0–2.5)
ALBUMIN MSPROF: 4.3 g/dL (ref 3.6–5.1)
ALT: 12 U/L (ref 6–29)
AST: 13 U/L (ref 10–35)
Alkaline phosphatase (APISO): 78 U/L (ref 33–130)
BUN: 12 mg/dL (ref 7–25)
CALCIUM: 9.3 mg/dL (ref 8.6–10.4)
CO2: 24 mmol/L (ref 20–32)
CREATININE: 0.69 mg/dL (ref 0.50–1.05)
Chloride: 107 mmol/L (ref 98–110)
GFR, EST NON AFRICAN AMERICAN: 101 mL/min/{1.73_m2} (ref >=60)
GFR, Est African American: 117 mL/min/{1.73_m2} (ref >=60)
GLOBULIN: 3 g/dL (ref 1.9–3.7)
Glucose, Bld: 141 mg/dL — ABNORMAL HIGH (ref 65–99)
Potassium: 4.3 mmol/L (ref 3.5–5.3)
SODIUM: 139 mmol/L (ref 135–146)
Total Bilirubin: 0.5 mg/dL (ref 0.2–1.2)
Total Protein: 7.3 g/dL (ref 6.1–8.1)

## 2017-07-10 LAB — CBC WITH DIFFERENTIAL/PLATELET
BASOS ABS: 58 {cells}/uL (ref 0–200)
BASOS PCT: 0.7 %
Eosinophils Absolute: 183 cells/uL (ref 15–500)
Eosinophils Relative: 2.2 %
HEMATOCRIT: 40.8 % (ref 35.0–45.0)
HEMOGLOBIN: 14.1 g/dL (ref 11.7–15.5)
LYMPHS ABS: 2473 {cells}/uL (ref 850–3900)
MCH: 28.7 pg (ref 27.0–33.0)
MCHC: 34.6 g/dL (ref 32.0–36.0)
MCV: 83.1 fL (ref 80.0–100.0)
MONOS PCT: 6.5 %
MPV: 10.5 fL (ref 7.5–12.5)
NEUTROS ABS: 5046 {cells}/uL (ref 1500–7800)
Neutrophils Relative %: 60.8 %
Platelets: 274 10*3/uL (ref 140–400)
RBC: 4.91 10*6/uL (ref 3.80–5.10)
RDW: 13.8 % (ref 11.0–15.0)
Total Lymphocyte: 29.8 %
WBC mixed population: 540 cells/uL (ref 200–950)
WBC: 8.3 10*3/uL (ref 3.8–10.8)

## 2017-07-10 LAB — LIPID PANEL
Cholesterol: 172 mg/dL (ref <200)
HDL: 42 mg/dL — AB (ref >50)
LDL CHOLESTEROL (CALC): 111 mg/dL — AB
Non-HDL Cholesterol (Calc): 130 mg/dL (calc) — ABNORMAL HIGH (ref <130)
TRIGLYCERIDES: 93 mg/dL (ref <150)
Total CHOL/HDL Ratio: 4.1 (calc) (ref <5.0)

## 2017-07-14 ENCOUNTER — Other Ambulatory Visit: Payer: Self-pay

## 2017-07-14 ENCOUNTER — Encounter: Payer: Self-pay | Admitting: Family Medicine

## 2017-07-14 ENCOUNTER — Ambulatory Visit (INDEPENDENT_AMBULATORY_CARE_PROVIDER_SITE_OTHER): Payer: 59 | Admitting: Family Medicine

## 2017-07-14 VITALS — BP 144/102 | HR 80 | Temp 98.5°F | Resp 14 | Ht 61.5 in | Wt 206.0 lb

## 2017-07-14 DIAGNOSIS — R7309 Other abnormal glucose: Secondary | ICD-10-CM | POA: Diagnosis not present

## 2017-07-14 DIAGNOSIS — Z0000 Encounter for general adult medical examination without abnormal findings: Secondary | ICD-10-CM | POA: Diagnosis not present

## 2017-07-14 DIAGNOSIS — R079 Chest pain, unspecified: Secondary | ICD-10-CM

## 2017-07-14 DIAGNOSIS — Z6838 Body mass index (BMI) 38.0-38.9, adult: Secondary | ICD-10-CM | POA: Diagnosis not present

## 2017-07-14 DIAGNOSIS — E785 Hyperlipidemia, unspecified: Secondary | ICD-10-CM | POA: Diagnosis not present

## 2017-07-14 DIAGNOSIS — M62830 Muscle spasm of back: Secondary | ICD-10-CM

## 2017-07-14 DIAGNOSIS — K2100 Gastro-esophageal reflux disease with esophagitis, without bleeding: Secondary | ICD-10-CM

## 2017-07-14 DIAGNOSIS — R7301 Impaired fasting glucose: Secondary | ICD-10-CM

## 2017-07-14 DIAGNOSIS — R030 Elevated blood-pressure reading, without diagnosis of hypertension: Secondary | ICD-10-CM

## 2017-07-14 DIAGNOSIS — E669 Obesity, unspecified: Secondary | ICD-10-CM

## 2017-07-14 LAB — TROPONIN I: Troponin I: <0.01 ng/mL (ref <=0.0)

## 2017-07-14 LAB — HEMOGLOBIN A1C, FINGERSTICK: Hgb A1C (fingerstick): 5.7 % OF TOTAL HGB (ref <6.0)

## 2017-07-14 MED ORDER — AMLODIPINE BESYLATE 5 MG PO TABS: 5.0000 mg | ORAL_TABLET | Freq: Every day | ORAL | 3 refills | Status: DC

## 2017-07-14 MED ORDER — CYCLOBENZAPRINE HCL 10 MG PO TABS: 10.0000 mg | ORAL_TABLET | Freq: Three times a day (TID) | ORAL | 0 refills | Status: DC | PRN

## 2017-07-14 MED ORDER — SUCRALFATE 1 G PO TABS: 1.0000 g | ORAL_TABLET | Freq: Three times a day (TID) | ORAL | 0 refills | Status: DC

## 2017-07-14 MED ORDER — PANTOPRAZOLE SODIUM 20 MG PO TBEC: 20.0000 mg | DELAYED_RELEASE_TABLET | Freq: Every day | ORAL | 1 refills | Status: DC

## 2017-07-14 NOTE — Progress Notes (Signed)
Spoke with patient after discussing with physician assistant.  She is been having chest discomfort points to mid epigastric region for the past week.  Her blood pressure is also been elevated 1 40-1 50s over 90s.  She does have history of hypertension but has not been on medication for the past few years.  She denies any radiating chest pain no shortness of breath no change with exertion no pain with eating.  She does have underlying GERD and has been advised to treat this by myself as well as gastroenterology but she want to try to change her diet first.  Reviewed EKG no acute findings.  Repeat blood pressure performed by myself was 146/92 in her right arm.  She does not appear to be in any significant pain.  She does admit to some stress with helping to care for her parents with little help from her sister.  1.  We will obtain stat troponin she is not given classic signs of acute MI no EKG changes pain for over a week now he does have risk factors for coronary artery disease.  She had recent labs done which for her physical or week ago her LDL was minimally elevated at 875 metabolic and CBC were normal.  She did have elevated fasting glucose but A1c today was 5.7%.  If her troponin does come back elevated she will be transferred to the emergency room for further work-up.  2.  GERD she will be given Protonix and Carafate per below, the most of her symptoms are GI related  3.  Hypertension after further discussion with her and her history and her readings at home she is highly anxious about her blood pressure and with her obesity above findings we will go ahead and start her on Norvasc 5 mg.  She will follow-up in 1 week with me for her blood pressure.

## 2017-07-14 NOTE — Progress Notes (Signed)
Patient: [name], Female    DOB: 12-13-1965, 52 y.o.   MRN: [id number] Visit Date: 07/14/2017  Today's Provider: Delsa Grana, PA-C   Chief Complaint  Patient presents with  . Annual Exam   Subjective:    Annual physical exam [name] is a 52 y.o. female who presents today for health maintenance and complete physical. She feels poorly, due to several factors both chronic and acute. She reports exercising minimally. She reports she is sleeping well.  Changes or concerns - elevated blood pressure, noticed two week ago average of 147/90's  Has chest tightness in central chest, substernal without radiation, for at least one week straight, gradual onset, tightness is constant with varying severity mild to moderate, gets better or worse without any identifiable factors.  She notes no change to her pain/tightness with eating, stress or anxiety, positional changes, exertion, movement, time of day, breathing.   Pain ranges from 5/10 to 1/10.   There is no change to her chest pain in her anterior chest with palpation.  She denies any shortness of breath, wheeze, cough, recent illness, palpitations, near-syncope or syncope, orthopnea PND, LE edema.  Has hx of GERD, not currently taking any medicines, is feeling occasional acid reflux at night or in the morning feels bitter reflux in throat. Had prilosec in the past, is having pain with eating, no dysphasia.  She is not taking any medicines, has tried to elevate the head of her bed and control her foods that she eats but is not helping. Had upper endoscopy in the past, "probably due for one" 12/2016, does not know if she has a diagnosis of hiatal hernia or gastritis.  Per chart review she has had pmhx of antral gastritis, esophageal reflux, duodenitis, esophageal dilatation, chronic gastritis and dysphasia in the past.    She also notes that severe tightness between her shoulder blades.  She states that it is not the sternal chest pain  radiating to her back but she does feel tightness in the back is reproducible by moving her shoulder blades around or by pressing on the muscles, feels like muscle tightness and spasms.   Gets "winded" with a lot of walking ever since she gained weight back 3 years ago.  (Lost 40 lbs 3 years ago with phentermine, and subsequently gained it back.) GERD also improved with weightloss and returned more severe after gaining weight back.  Mom had heart aneurysms 2 different ones 4 years ago had surgery Dad had mild stroke, has carotid artery disease that caused it. She has been taking care of both of her parents and their medical issues, this is not a new stressor in her life but it has been constant irritating, also has a sister who lives with her parents who does not help much and this is extremely frustrating to her.    Pt reports extreme anxiety just being in the doctors office today.  No past medical history of hypertension, diabetes, elevated cholesterol.  She did have lab work obtained and completed prior to this visit.  Her fasting blood glucose was elevated to 141.  Her LDL was elevated and HDL below goal.  Patient is due for mammogram however she refuses today, has been doing this with her OB/GYN and will contact them to update her Pap.  She does have a history of leiomyoma, negative Pap was 08/24/2013.  Several months after that she did have a endometrial biopsy.  -----------------------------------------------------------------   Review of Systems  Constitutional:  Negative.  Negative for activity change, appetite change, chills, diaphoresis, fatigue, fever and unexpected weight change.  HENT: Negative.  Negative for sore throat, trouble swallowing and voice change.   Eyes: Negative.   Respiratory: Positive for chest tightness. Negative for apnea, cough, choking, shortness of breath, wheezing and stridor.   Cardiovascular: Positive for chest pain. Negative for palpitations and leg swelling.    Gastrointestinal: Negative.  Negative for abdominal distention, abdominal pain, constipation, diarrhea, nausea and vomiting.  Endocrine: Negative.   Genitourinary: Negative.   Musculoskeletal: Positive for myalgias.  Skin: Negative.   Allergic/Immunologic: Negative.   Neurological: Negative.   Hematological: Negative.  Negative for adenopathy. Does not bruise/bleed easily.  Psychiatric/Behavioral: Negative.     Social History      She  reports that she has never smoked. She has never used smokeless tobacco. She reports that she does not drink alcohol or use drugs.       Social History   Socioeconomic History  . Marital status: Married    Spouse name: Not on file  . Number of children: Not on file  . Years of education: Not on file  . Highest education level: Not on file  Occupational History  . Occupation: Full time    Employer: [employer]  Social Needs  . Financial resource strain: Not on file  . Food insecurity:    Worry: Not on file    Inability: Not on file  . Transportation needs:    Medical: Not on file    Non-medical: Not on file  Tobacco Use  . Smoking status: Never Smoker  . Smokeless tobacco: Never Used  Substance and Sexual Activity  . Alcohol use: No  . Drug use: No  . Sexual activity: Yes    Birth control/protection: Surgical  Lifestyle  . Physical activity:    Days per week: Not on file    Minutes per session: Not on file  . Stress: Not on file  Relationships  . Social connections:    Talks on phone: Not on file    Gets together: Not on file    Attends religious service: Not on file    Active member of club or organization: Not on file    Attends meetings of clubs or organizations: Not on file    Relationship status: Not on file  Other Topics Concern  . Not on file  Social History Narrative   No regular exercise    Past Medical History:  Diagnosis Date  . Chronic gastritis   . Complication of anesthesia   . Depression   .  Duodenitis 08/2005  . Dysphagia   . Esophageal dilatation      Patient Active Problem List   Diagnosis Date Noted  . Special screening for malignant neoplasms, colon   . Depression with anxiety 08/26/2015  . Lateral epicondylitis 11/29/2013  . Leiomyoma of uterus, unspecified 09/23/2013  . Mixed stress and urge urinary incontinence 07/16/2012  . Essential hypertension, benign 07/16/2012  . Obesity 07/16/2012  . Insomnia 07/16/2012  . Seasonal depression ([place]) 07/16/2012  . ELECTROCARDIOGRAM, ABNORMAL 01/05/2010  . ESOPHAGEAL REFLUX 09/11/2007  . GASTRITIS, ANTRAL 09/11/2007  . HEADACHES, HX OF 09/11/2007    Past Surgical History:  Procedure Laterality Date  . BREAST BIOPSY Right 04/26/2013   Procedure: BREAST BIOPSY TIMES TWO;  Surgeon: Jamesetta So, MD;  Location: [facility];  Service: General;  Laterality: Right;  . BREAST MASS EXCISION     bilateral tumor removal  .  COLONOSCOPY N/A 01/27/2016   Procedure: COLONOSCOPY;  Surgeon: Danie Binder, MD;  Location: [facility];  Service: Endoscopy;  Laterality: N/A;  830  . DILITATION & CURRETTAGE/HYSTROSCOPY WITH THERMACHOICE ABLATION N/A 10/11/2013   Procedure: DILATATION & CURETTAGE/HYSTEROSCOPY WITH THERMACHOICE ABLATION;  Surgeon: Florian Buff, MD;  Location: [facility];  Service: Gynecology;  Laterality: N/A;  total therapy time- 20 minutes temp  76 deg C  . ESOPHAGEAL DILATION  08/2005  . ESOPHAGOGASTRODUODENOSCOPY N/A 01/27/2016   Procedure: ESOPHAGOGASTRODUODENOSCOPY (EGD);  Surgeon: Danie Binder, MD;  Location: [facility];  Service: Endoscopy;  Laterality: N/A;  . SAVORY DILATION N/A 01/27/2016   Procedure: SAVORY DILATION;  Surgeon: Danie Binder, MD;  Location: [facility];  Service: Endoscopy;  Laterality: N/A;  . TUBAL LIGATION    . WISDOM TOOTH EXTRACTION      Family History        Family Status  Relation Name Status  . Father  Alive  . Mother  Alive       2 different aneurysms - thoracic abdominal?  .  Sister  Alive  . Ethlyn Daniels  Deceased  . Annamarie Major  Deceased  . Cousin  Deceased        Her family history includes Cancer in her cousin, paternal aunt, and paternal uncle; Heart disease in her father and mother; Hypertension in her father; Stroke in her father.      Allergies  Allergen Reactions  . Lisinopril Cough     Current Outpatient Medications:  .  ketoconazole (NIZORAL) 200 MG tablet, 1 PO NOW AND REPEAT IN 7 DAYS, Disp: 2 tablet, Rfl: 1 .  amLODipine (NORVASC) 5 MG tablet, Take 1 tablet (5 mg total) by mouth daily., Disp: 30 tablet, Rfl: 3 .  cyclobenzaprine (FLEXERIL) 10 MG tablet, Take 1 tablet (10 mg total) by mouth 3 (three) times daily as needed for muscle spasms., Disp: 30 tablet, Rfl: 0 .  pantoprazole (PROTONIX) 20 MG tablet, Take 1 tablet (20 mg total) by mouth daily. On an empty stomach and 1 hour before eating breakfast., Disp: 90 tablet, Rfl: 1 .  sucralfate (CARAFATE) 1 g tablet, Take 1 tablet (1 g total) by mouth 4 (four) times daily -  with meals and at bedtime., Disp: 40 tablet, Rfl: 0   Patient Care Team: Alycia Rossetti, MD as PCP - General (Family Medicine)      Objective:   Vitals: BP (!) 144/102 (BP Location: Right Arm, Patient Position: Sitting)   Pulse 80   Temp 98.5 F (36.9 C) (Oral)   Resp 14   Ht 5' 1.5" (1.562 m)   Wt 206 lb (93.4 kg)   SpO2 98%   BMI 38.29 kg/m    Vitals:   07/14/17 1507 07/14/17 1512  BP: (!) 148/100 (!) 144/102  Pulse: 80   Resp: 14   Temp: 98.5 F (36.9 C)   TempSrc: Oral   SpO2: 98%   Weight: 206 lb (93.4 kg)   Height: 5' 1.5" (1.562 m)      Physical Exam  Constitutional: She is oriented to person, place, and time. She appears well-developed and well-nourished.  Non-toxic appearance. She does not have a sickly appearance. She does not appear ill. No distress.  HENT:  Head: Normocephalic and atraumatic.  Right Ear: External ear normal.  Left Ear: External ear normal.  Nose: Nose normal.  Mouth/Throat:  Uvula is midline, oropharynx is clear and moist and mucous membranes are normal.  Eyes: Pupils are equal, round, and reactive to light. Conjunctivae, EOM and lids are normal.  Neck: Trachea normal, normal range of motion, full passive range of motion without pain and phonation normal. Neck supple. No JVD present. No tracheal tenderness, no spinous process tenderness and no muscular tenderness present. No neck rigidity. No tracheal deviation and normal range of motion present. No thyroid mass and no thyromegaly present.  Cardiovascular: Normal rate, regular rhythm, normal heart sounds and normal pulses. PMI is not displaced. Exam reveals no gallop and no friction rub.  No murmur heard. Pulses:      Radial pulses are 2+ on the right side, and 2+ on the left side.       Posterior tibial pulses are 2+ on the right side, and 2+ on the left side.  No LE edema  Pulmonary/Chest: Effort normal and breath sounds normal. No accessory muscle usage or stridor. No respiratory distress. She has no decreased breath sounds. She has no wheezes. She has no rhonchi. She has no rales. She exhibits no tenderness.  Abdominal: Soft. Normal appearance and bowel sounds are normal. She exhibits no distension, no abdominal bruit, no pulsatile midline mass and no mass. There is no tenderness. There is no rigidity, no rebound, no guarding and no CVA tenderness.  Musculoskeletal: Normal range of motion. She exhibits no edema or deformity.       Cervical back: She exhibits tenderness and spasm. She exhibits normal range of motion, no bony tenderness, no swelling, no edema and no deformity.       Thoracic back: Normal.       Lumbar back: Normal.       Back:  Muscle tension to outlined area  Lymphadenopathy:    She has no cervical adenopathy.  Neurological: She is alert and oriented to person, place, and time. She has normal strength. She exhibits normal muscle tone. Coordination and gait normal.  Skin: Skin is warm, dry and  intact. Capillary refill takes less than 2 seconds. No rash noted. She is not diaphoretic. No pallor.  Psychiatric: Her speech is normal and behavior is normal. Judgment and thought content normal. Her mood appears anxious. Cognition and memory are normal.  Nursing note and vitals reviewed.    Depression Screen PHQ 2/9 Scores 07/14/2017 11/23/2016 09/26/2014  PHQ - 2 Score 0 0 2    EKG: NSR, non-specific T-wave abnormality, left axis deviation, no ST elevation or depression, no ischemic changes  Assessment & Plan:     Routine Health Maintenance and Physical Exam  Exercise Activities and Dietary recommendations Goals    Diet and exercise changes for obesity, HTN, pre-diabetes and hyperlipidemia discussed - printed information given and highlighted.  I have offered referral to nutrition or weight loss specialist and she declined, states that she will use available health initiatives given to her with her health insurance from her employer.  He will notify us of which resources she is using, i.e. dietitian consult, exercise classes etc      Immunization History  Administered Date(s) Administered  . Td 01/16/2013    Health Maintenance  Topic Date Due  . HIV Screening  10/24/1980  . PAP SMEAR  01/01/2018 (Originally 08/23/2016)  . INFLUENZA VACCINE  08/31/2017  . MAMMOGRAM  11/12/2018  . TETANUS/TDAP  01/17/2023  . COLONOSCOPY  01/26/2026     Discussed health benefits of physical activity, and encouraged her to engage in regular exercise appropriate for her age and condition.     Patient will  get Pap smear done by OB/GYN  ASCVD heart risk calculator:1.9% 10-year risk of heart disease or stroke On the basis of your age and calculated risk for heart disease or stroke under 10%, the USPSTF guidelines suggest you would not likely benefit from starting aspirin. On the basis of your calculated risk for heart disease or stroke less than 7.5%, the ACC/AHA guidelines suggest you have  no indication to be on a statin.     --------------------------------------------------------------------    ICD-10-CM   1. Chest pain, unspecified type R07.9 EKG 12-Lead    Troponin I    DG Chest 2 View  2. Routine general medical examination at a health care facility Z00.00   3. Elevated fasting blood sugar R73.01 Hemoglobin A1C, fingerstick   A1C 5.7%  4. Gastroesophageal reflux disease with esophagitis K21.0 pantoprazole (PROTONIX) 20 MG tablet    sucralfate (CARAFATE) 1 g tablet   strong hx of gerd, gastritis, feel she will need PPI, sent protonix to pharmacy with carafate to help with pain while PPI works.  Recheck in 2 weeks  5. Dyslipidemia (high LDL; low HDL) E78.5    diet and exercise, she is Dr. Dorian Heckle pt, but we discussed 6 month FLP  6. Elevated blood-pressure reading without diagnosis of hypertension R03.0 amLODipine (NORVASC) 5 MG tablet   Pt appears extremely anxious, discussed waiting and monitoring while we tx other acute issues, or start treatment and reevaluate - per Dr. Buelah Manis, 5 mg norvasc  7. Spasm of thoracic back muscle M62.830 cyclobenzaprine (FLEXERIL) 10 MG tablet   very high muscle tensions, given muscle relaxers  8. Class 2 obesity with body mass index (BMI) of 38.0 to 38.9 in adult, unspecified obesity type, unspecified whether serious comorbidity present E66.9    Z68.38    Pt will use employer available resources to assist with diet, exercise. She declined referral to nutrition/weightloss.     Chest pain: Patient's chest pain history is not overly concerning for CAD but she does have risk factors including recently elevated blood pressure, obesity, mildly elevated LDL.  She does not have a strong family history component.  No smoking history.  No exertional dyspnea or angina.  EKG was unremarkable.  Discussed with attending physician and patient's PCP ordering troponin to help reassure her of no ischemia or infarct.  She agrees to stat troponin tonight,  will follow closely, patient understands that if it is positive she has to go to the ER immediately for office admission and serial troponins, likely more thorough work-up.  PE unlikely and clinically r/o by Wells criteria  Feel CP is most likely related to GERD with gastritis, and stress/anxiety, she appears very anxious and tense.  Initiating treatment for GERD, muscle relaxers for muscle tension in her upper trapezius laterally.   Have discussed patient with Dr. Buelah Manis who is personally seen and evaluated the patient and has documented additional plan, she did initiate treatment of 5 mg amlodipine.  Delsa Grana, PA-C 07/14/17 6:18 PM  [facility]

## 2017-07-14 NOTE — Patient Instructions (Addendum)
Keep a blood pressure log and return to [place], or call in results.  See the handouts on diet and exercise changes to help optimize your good and bad cholesterol.  I will call you with troponin results tonight.  Nonspecific Chest Pain Chest pain can be caused by many different conditions. There is always a chance that your pain could be related to something serious, such as a heart attack or a blood clot in your lungs. Chest pain can also be caused by conditions that are not life-threatening. If you have chest pain, it is very important to follow up with your health care provider. What are the causes? Causes of this condition include:  Heartburn.  Pneumonia or bronchitis.  Anxiety or stress.  Inflammation around your heart (pericarditis) or lung (pleuritis or pleurisy).  A blood clot in your lung.  A collapsed lung (pneumothorax). This can develop suddenly on its own (spontaneous pneumothorax) or from trauma to the chest.  Shingles infection (varicella-zoster virus).  Heart attack.  Damage to the bones, muscles, and cartilage that make up your chest wall. This can include: ? Bruised bones due to injury. ? Strained muscles or cartilage due to frequent or repeated coughing or overwork. ? Fracture to one or more ribs. ? Sore cartilage due to inflammation (costochondritis).  What increases the risk? Risk factors for this condition may include:  Activities that increase your risk for trauma or injury to your chest.  Respiratory infections or conditions that cause frequent coughing.  Medical conditions or overeating that can cause heartburn.  Heart disease or family history of heart disease.  Conditions or health behaviors that increase your risk of developing a blood clot.  Having had chicken pox (varicella zoster).  What are the signs or symptoms? Chest pain can feel like:  Burning or tingling on the surface of your chest or deep in your chest.  Crushing, pressure,  aching, or squeezing pain.  Dull or sharp pain that is worse when you move, cough, or take a deep breath.  Pain that is also felt in your back, neck, shoulder, or arm, or pain that spreads to any of these areas.  Your chest pain may come and go, or it may stay constant. How is this diagnosed? Lab tests or other studies may be needed to find the cause of your pain. Your health care provider may have you take a test called an ECG (electrocardiogram). An ECG records your heartbeat patterns at the time the test is performed. You may also have other tests, such as:  Transthoracic echocardiogram (TTE). In this test, sound waves are used to create a picture of the heart structures and to look at how blood flows through your heart.  Transesophageal echocardiogram (TEE).This is a more advanced imaging test that takes images from inside your body. It allows your health care provider to see your heart in finer detail.  Cardiac monitoring. This allows your health care provider to monitor your heart rate and rhythm in real time.  Holter monitor. This is a portable device that records your heartbeat and can help to diagnose abnormal heartbeats. It allows your health care provider to track your heart activity for several days, if needed.  Stress tests. These can be done through exercise or by taking medicine that makes your heart beat more quickly.  Blood tests.  Other imaging tests.  How is this treated? Treatment depends on what is causing your chest pain. Treatment may include:  Medicines. These may include: ? Acid  blockers for heartburn. ? Anti-inflammatory medicine. ? Pain medicine for inflammatory conditions. ? Antibiotic medicine, if an infection is present. ? Medicines to dissolve blood clots. ? Medicines to treat coronary artery disease (CAD).  Supportive care for conditions that do not require medicines. This may include: ? Resting. ? Applying heat or cold packs to injured  areas. ? Limiting activities until pain decreases.  Follow these instructions at home: Medicines  If you were prescribed an antibiotic, take it as told by your health care provider. Do not stop taking the antibiotic even if you start to feel better.  Take over-the-counter and prescription medicines only as told by your health care provider. Lifestyle  Do not use any products that contain nicotine or tobacco, such as cigarettes and e-cigarettes. If you need help quitting, ask your health care provider.  Do not drink alcohol.  Make lifestyle changes as directed by your health care provider. These may include: ? Getting regular exercise. Ask your health care provider to suggest some activities that are safe for you. ? Eating a heart-healthy diet. A registered dietitian can help you to learn healthy eating options. ? Maintaining a healthy weight. ? Managing diabetes, if necessary. ? Reducing stress, such as with yoga or relaxation techniques. General instructions  Avoid any activities that bring on chest pain.  If heartburn is the cause for your chest pain, raise (elevate) the head of your bed about 6 inches (15 cm) by putting blocks under the legs. Sleeping with more pillows does not effectively relieve heartburn because it only changes the position of your head.  Keep all follow-up visits as told by your health care provider. This is important. This includes any further testing if your chest pain does not go away. Contact a health care provider if:  Your chest pain does not go away.  You have a rash with blisters on your chest.  You have a fever.  You have chills. Get help right away if:  Your chest pain is worse.  You have a cough that gets worse, or you cough up blood.  You have severe pain in your abdomen.  You have severe weakness.  You faint.  You have sudden, unexplained chest discomfort.  You have sudden, unexplained discomfort in your arms, back, neck, or  jaw.  You have shortness of breath at any time.  You suddenly start to sweat, or your skin gets clammy.  You feel nauseous or you vomit.  You suddenly feel light-headed or dizzy.  Your heart begins to beat quickly, or it feels like it is skipping beats. These symptoms may represent a serious problem that is an emergency. Do not wait to see if the symptoms will go away. Get medical help right away. Call your local emergency services (911 in the [place] Do not drive yourself to the hospital. This information is not intended to replace advice given to you by your health care provider. Make sure you discuss any questions you have with your health care provider. Document Released: 10/27/2004 Document Revised: 10/12/2015 Document Reviewed: 10/12/2015 Elsevier Interactive Patient Education  2017 [employer] Inc.    Cholesterol Cholesterol is a white, waxy, fat-like substance that is needed by the human body in small amounts. The liver makes all the cholesterol we need. Cholesterol is carried from the liver by the blood through the blood vessels. Deposits of cholesterol (plaques) may build up on blood vessel (artery) walls. Plaques make the arteries narrower and stiffer. Cholesterol plaques increase the risk for heart  attack and stroke. You cannot feel your cholesterol level even if it is very high. The only way to know that it is high is to have a blood test. Once you know your cholesterol levels, you should keep a record of the test results. Work with your health care provider to keep your levels in the desired range. What do the results mean?  Total cholesterol is a rough measure of all the cholesterol in your blood.  LDL (low-density lipoprotein) is the "bad" cholesterol. This is the type that causes plaque to build up on the artery walls. You want this level to be low.  HDL (high-density lipoprotein) is the "good" cholesterol because it cleans the arteries and carries the LDL away. You want this  level to be high.  Triglycerides are fat that the body can either burn for energy or store. High levels are closely linked to heart disease. What are the desired levels of cholesterol?  Total cholesterol below 200.  LDL below 100 for people who are at risk, below 70 for people at very high risk.  HDL above 40 is good. A level of 60 or higher is considered to be protective against heart disease.  Triglycerides below 150. How can I lower my cholesterol? Diet Follow your diet program as told by your health care provider.  Choose fish or white meat chicken and [place], roasted or baked. Limit fatty cuts of red meat, fried foods, and processed meats, such as sausage and lunch meats.  Eat lots of fresh fruits and vegetables.  Choose whole grains, beans, pasta, potatoes, and cereals.  Choose olive oil, corn oil, or canola oil, and use only small amounts.  Avoid butter, mayonnaise, shortening, or palm kernel oils.  Avoid foods with trans fats.  Drink skim or nonfat milk and eat low-fat or nonfat yogurt and cheeses. Avoid whole milk, cream, ice cream, egg yolks, and full-fat cheeses.  Healthier desserts include angel food cake, ginger snaps, animal crackers, hard candy, popsicles, and low-fat or nonfat frozen yogurt. Avoid pastries, cakes, pies, and cookies.  Exercise  Follow your exercise program as told by your health care provider. A regular program: ? Helps to decrease LDL and raise HDL. ? Helps with weight control.  Do things that increase your activity level, such as gardening, walking, and taking the stairs.  Ask your health care provider about ways that you can be more active in your daily life.  Medicine  Take over-the-counter and prescription medicines only as told by your health care provider. ? Medicine may be prescribed by your health care provider to help lower cholesterol and decrease the risk for heart disease. This is usually done if diet and exercise have failed  to bring down cholesterol levels. ? If you have several risk factors, you may need medicine even if your levels are normal.  This information is not intended to replace advice given to you by your health care provider. Make sure you discuss any questions you have with your health care provider. Document Released: 10/12/2000 Document Revised: 08/15/2015 Document Reviewed: 07/18/2015 Elsevier Interactive Patient Education  2018 [employer].  Gastroesophageal Reflux Disease, Adult Normally, food travels down the esophagus and stays in the stomach to be digested. If a person has gastroesophageal reflux disease (GERD), food and stomach acid move back up into the esophagus. When this happens, the esophagus becomes sore and swollen (inflamed). Over time, GERD can make small holes (ulcers) in the lining of the esophagus. Follow these instructions at home:  Diet  Follow a diet as told by your doctor. You may need to avoid foods and drinks such as: ? Coffee and tea (with or without caffeine). ? Drinks that contain alcohol. ? Energy drinks and sports drinks. ? Carbonated drinks or sodas. ? Chocolate and cocoa. ? Peppermint and mint flavorings. ? Garlic and onions. ? Horseradish. ? Spicy and acidic foods, such as peppers, chili powder, curry powder, vinegar, hot sauces, and BBQ sauce. ? Citrus fruit juices and citrus fruits, such as oranges, lemons, and limes. ? Tomato-based foods, such as red sauce, chili, salsa, and pizza with red sauce. ? Fried and fatty foods, such as donuts, french fries, potato chips, and high-fat dressings. ? High-fat meats, such as hot dogs, rib eye steak, sausage, ham, and bacon. ? High-fat dairy items, such as whole milk, butter, and cream cheese.  Eat small meals often. Avoid eating large meals.  Avoid drinking large amounts of liquid with your meals.  Avoid eating meals during the 2-3 hours before bedtime.  Avoid lying down right after you eat.  Do not exercise  right after you eat. General instructions  Pay attention to any changes in your symptoms.  Take over-the-counter and prescription medicines only as told by your doctor. Do not take aspirin, ibuprofen, or other NSAIDs unless your doctor says it is okay.  Do not use any tobacco products, including cigarettes, chewing tobacco, and e-cigarettes. If you need help quitting, ask your doctor.  Wear loose clothes. Do not wear anything tight around your waist.  Raise (elevate) the head of your bed about 6 inches (15 cm).  Try to lower your stress. If you need help doing this, ask your doctor.  If you are overweight, lose an amount of weight that is healthy for you. Ask your doctor about a safe weight loss goal.  Keep all follow-up visits as told by your doctor. This is important. Contact a doctor if:  You have new symptoms.  You lose weight and you do not know why it is happening.  You have trouble swallowing, or it hurts to swallow.  You have wheezing or a cough that keeps happening.  Your symptoms do not get better with treatment.  You have a hoarse voice. Get help right away if:  You have pain in your arms, neck, jaw, teeth, or back.  You feel sweaty, dizzy, or light-headed.  You have chest pain or shortness of breath.  You throw up (vomit) and your throw up looks like blood or coffee grounds.  You pass out (faint).  Your poop (stool) is bloody or black.  You cannot swallow, drink, or eat. This information is not intended to replace advice given to you by your health care provider. Make sure you discuss any questions you have with your health care provider. Document Released: 07/06/2007 Document Revised: 06/25/2015 Document Reviewed: 05/14/2014 Elsevier Interactive Patient Education  [employer].

## 2017-07-16 NOTE — Progress Notes (Signed)
Negative labs, I called pt personally and reviewed results.

## 2017-07-21 ENCOUNTER — Ambulatory Visit: Payer: 59 | Admitting: Family Medicine

## 2017-08-08 ENCOUNTER — Encounter: Payer: 59 | Admitting: Family Medicine

## 2017-09-14 ENCOUNTER — Ambulatory Visit: Payer: 59 | Admitting: General Surgery

## 2017-09-14 ENCOUNTER — Encounter: Payer: Self-pay | Admitting: General Surgery

## 2017-09-14 VITALS — BP 151/94 | Temp 96.8°F | Resp 20 | Wt 205.0 lb

## 2017-09-14 DIAGNOSIS — L918 Other hypertrophic disorders of the skin: Secondary | ICD-10-CM

## 2017-09-14 NOTE — Progress Notes (Signed)
[name]; [id number]; July 22, 1965   HPI Patient is a 52 year old white female who was referred to my care by Dr. Buelah Manis for evaluation treatment of a skin tag on her left thigh.  Is been present for some time now but is becoming inflamed and tender to touch.  She denies any history of skin cancer.  She currently has 0 out of 10 pain.  It keeps rubbing on her close and becoming irritated. Past Medical History:  Diagnosis Date  . Chronic gastritis   . Complication of anesthesia   . Depression   . Duodenitis 08/2005  . Dysphagia   . Esophageal dilatation     Past Surgical History:  Procedure Laterality Date  . BREAST BIOPSY Right 04/26/2013   Procedure: BREAST BIOPSY TIMES TWO;  Surgeon: Jamesetta So, MD;  Location: [facility];  Service: General;  Laterality: Right;  . BREAST MASS EXCISION     bilateral tumor removal  . COLONOSCOPY N/A 01/27/2016   Procedure: COLONOSCOPY;  Surgeon: Danie Binder, MD;  Location: [facility];  Service: Endoscopy;  Laterality: N/A;  830  . DILITATION & CURRETTAGE/HYSTROSCOPY WITH THERMACHOICE ABLATION N/A 10/11/2013   Procedure: DILATATION & CURETTAGE/HYSTEROSCOPY WITH THERMACHOICE ABLATION;  Surgeon: Florian Buff, MD;  Location: [facility];  Service: Gynecology;  Laterality: N/A;  total therapy time- 20 minutes temp  76 deg C  . ESOPHAGEAL DILATION  08/2005  . ESOPHAGOGASTRODUODENOSCOPY N/A 01/27/2016   Procedure: ESOPHAGOGASTRODUODENOSCOPY (EGD);  Surgeon: Danie Binder, MD;  Location: [facility];  Service: Endoscopy;  Laterality: N/A;  . SAVORY DILATION N/A 01/27/2016   Procedure: SAVORY DILATION;  Surgeon: Danie Binder, MD;  Location: [facility];  Service: Endoscopy;  Laterality: N/A;  . TUBAL LIGATION    . WISDOM TOOTH EXTRACTION      Family History  Problem Relation Age of Onset  . Hypertension Father   . Stroke Father   . Heart disease Father   . Heart disease Mother   . Cancer Paternal Aunt        colon  . Cancer Paternal Uncle         colon  . Cancer Cousin        colon    Current Outpatient Medications on File Prior to Visit  Medication Sig Dispense Refill  . amLODipine (NORVASC) 5 MG tablet Take 1 tablet (5 mg total) by mouth daily. 30 tablet 3  . ketoconazole (NIZORAL) 200 MG tablet 1 PO NOW AND REPEAT IN 7 DAYS 2 tablet 1  . pantoprazole (PROTONIX) 20 MG tablet Take 1 tablet (20 mg total) by mouth daily. On an empty stomach and 1 hour before eating breakfast. 90 tablet 1  . sucralfate (CARAFATE) 1 g tablet Take 1 tablet (1 g total) by mouth 4 (four) times daily -  with meals and at bedtime. 40 tablet 0  . cyclobenzaprine (FLEXERIL) 10 MG tablet Take 1 tablet (10 mg total) by mouth 3 (three) times daily as needed for muscle spasms. (Patient not taking: Reported on 09/14/2017) 30 tablet 0   No current facility-administered medications on file prior to visit.     Allergies  Allergen Reactions  . Lisinopril Cough    Social History   Substance and Sexual Activity  Alcohol Use No    Social History   Tobacco Use  Smoking Status Never Smoker  Smokeless Tobacco Never Used    Review of Systems  Constitutional: Negative.   HENT: Negative.   Eyes: Negative.  Respiratory: Negative.   Cardiovascular: Negative.   Gastrointestinal: Negative.   Genitourinary: Negative.   Musculoskeletal: Negative.   Neurological: Negative.   Endo/Heme/Allergies: Negative.   Psychiatric/Behavioral: Negative.     Objective   Vitals:   09/14/17 0957  BP: (!) 151/94  Resp: 20  Temp: (!) 96.8 F (36 C)    Physical Exam  Constitutional: She is oriented to person, place, and time. She appears well-developed and well-nourished. No distress.  HENT:  Head: Normocephalic and atraumatic.  Cardiovascular: Normal rate, regular rhythm and normal heart sounds. Exam reveals no gallop and no friction rub.  No murmur heard. Pulmonary/Chest: Effort normal and breath sounds normal. No stridor. No respiratory distress. She has  no wheezes. She has no rales.  Neurological: She is alert and oriented to person, place, and time.  Skin:  Irritated and inflamed skin tag noted anterior left upper thigh.  After informed consent was obtained, shave removal of the skin tag was performed using 1% Xylocaine and cautery.  She tolerated the procedure well.  The skin tag was disposed of.  Vitals reviewed.   Assessment  Skin tag, left thigh, removed Plan   Keep wound clean and dry.  Follow-up as needed.

## 2017-09-14 NOTE — Patient Instructions (Signed)
Skin Tag, Adult A skin tag (acrochordon) is a soft, extra growth of skin. Most skin tags are flesh-colored and rarely bigger than a pencil eraser. They commonly form near areas where there are folds in the skin, such as the armpit or groin. Skin tags are not dangerous, and they do not spread from person to person (are not contagious). You may have one skin tag or several. Skin tags do not require treatment. However, your health care provider may recommend removal of a skin tag if it:  Gets irritated from clothing.  Bleeds.  Is visible and unsightly.  Your health care provider can remove skin tags with a simple surgical procedure or a procedure that involves freezing the skin tag. Follow these instructions at home:  Watch for any changes in your skin tag. A normal skin tag does not require any other special care at home.  Take over-the-counter and prescription medicines only as told by your health care provider.  Keep all follow-up visits as told by your health care provider. This is important. Contact a health care provider if:  You have a skin tag that: ? Becomes painful. ? Changes color. ? Bleeds. ? Swells.  You develop more skin tags. This information is not intended to replace advice given to you by your health care provider. Make sure you discuss any questions you have with your health care provider. Document Released: 02/01/2015 Document Revised: 09/13/2015 Document Reviewed: 02/01/2015 Elsevier Interactive Patient Education  2018 Elsevier Inc.  

## 2017-09-19 ENCOUNTER — Encounter: Payer: 59 | Attending: Family Medicine | Admitting: Nutrition

## 2017-09-19 ENCOUNTER — Encounter: Payer: Self-pay | Admitting: Nutrition

## 2017-09-19 VITALS — Ht 62.0 in | Wt 204.0 lb

## 2017-09-19 DIAGNOSIS — E669 Obesity, unspecified: Secondary | ICD-10-CM

## 2017-09-19 NOTE — Patient Instructions (Signed)
Goals 1. Follow My Plate 2. Cut out sodas 3. Drink 4-5 bottles per day 4. Cut chips 5. Walk 30 minutes 5 days a week. Lose 1 lbs per week

## 2017-09-19 NOTE — Progress Notes (Signed)
  Medical Nutrition Therapy:  Appt start time: 1630 end time:  1700.   Assessment:  Primary concerns today: Obesity. Lives with her husband. Works as a Research scientist (physical sciences).  Desired to lose weight. Had been going to [facility] in [place]. Lost 30 lbs  And got down to 168 lbs. Had been on Phenteramind and Topax. Physical activity: walking some and was going to [facility] but hasn't done much recently. Eats 1-2 meals per day.  Works through lunch usually. .   Preferred Learning Style:   No preference indicated   Learning Readiness:  Ready  Change in progress   MEDICATIONS:   DIETARY INTAKE:  24-hr recall:  B ( AM): Apple and coke zero or lemonade. Snk ( AM): cantelope 1 cup, Soda,  L ( PM): 1 hot dog, Coke Zero Snk ( PM):  Chips  D ( PM):  Chicken biscuit and potatoes, Pepsi Snk ( PM):  Beverages: sodas,   Usual physical activity: ADL  Estimated energy needs: 1200  calories 135 g carbohydrates 90  g protein 33 g fat  Progress Towards Goal(s):  In progress.   Nutritional Diagnosis:  NI-1.5 Excessive energy intake As related to Obesity.  As evidenced by BMI 37.    Intervention:  Nutrition Nutrition and  Pre-Diabetes education provided on My Plate, CHO counting, meal planning, portion sizes, timing of meals, avoiding snacks between meals u, taking medications as prescribed, benefits of exercising 30 minutes per day for 150 minutes a week and prevention of DM. Marland KitchenGoals 1. Follow My Plate 2. Cut out sodas 3. Drink 4-5 bottles per day 4. Cut chips 5. Walk 30 minutes 5 days a week. Lose 1 lbs per week  Teaching Method Utilized:  Visual Auditory Hands on  Handouts given during visit include:  The Plate Method   Meal Plan Card   Barriers to learning/adherence to lifestyle change: none  Demonstrated degree of understanding via:  Teach Back   Monitoring/Evaluation:  Dietary intake, exercise, meal planning , and body weight in 1 month(s).

## 2017-09-25 ENCOUNTER — Encounter: Payer: 59 | Admitting: Registered"

## 2017-09-25 VITALS — Ht 61.5 in | Wt 201.1 lb

## 2017-09-25 DIAGNOSIS — Z713 Dietary counseling and surveillance: Secondary | ICD-10-CM | POA: Insufficient documentation

## 2017-09-25 NOTE — Patient Instructions (Addendum)
Explore other ways to help you feel better, things that might take the place of comfort foods. Continue to having apples for snacks or breakfast Continue having vegetables daily, can use for snacks with humus or some other dip.  Can try adding frozen berries to sparkling water for another option to water. Herbal teas are another option to get flavor in your water

## 2017-09-25 NOTE — Progress Notes (Signed)
Medical Nutrition Therapy:  Appt start time: 9147 end time:  1600.  [employer] Employee visit 2 of 3  Assessment:  Primary concerns today: Patient states she has not had soda since her visit last Tuesday and other than caffeine withdrawal for a couple of days, was not too difficult. Patient states last week she planned ahead and had protein meal replacement shake when she knew she would not have time for lunch.  Feels more energy since making changes. Pt states she enjoys vegetables and is open to consider more ways to including them for snacks.  Physical activity: no exercise since last visit because she has a lot going on with children and grandchildren. Pt states she belongs to [facility] in [place] but might be more convenient to just walk 30 min in her neighborhood.  Wt Readings from Last 3 Encounters:  09/25/17 201 lb 1.6 oz (91.2 kg)  09/19/17 204 lb (92.5 kg)  09/14/17 205 lb (93 kg)   Preferred Learning Style:   No preference indicated   Learning Readiness:  Ready  Change in progress   MEDICATIONS: reviewed   DIETARY INTAKE:  24-hr recall:  B ( AM): 2 boiled eggs, 2 bacon, water Snk ( AM): ritz crackers with pepper jack cheese,  L ( PM): atkins 30 g pro drink Snk ( PM):  none D ( PM):  Meatballs, onions, peppers, sweet potato Snk ( PM): small bowl popcorn Beverages: water  Usual physical activity: ADL  Estimated energy needs: 1200  calories 135 g carbohydrates 90  g protein 33 g fat  Progress Towards Goal(s):  In progress.   Nutritional Diagnosis:  NI-1.5 Excessive energy intake As related to Obesity.  As evidenced by BMI 37.    Intervention:  Nutrition education: Discussed comfort foods and looking for alternatives. Discussed going from motivation to exercise to making it happen. Discussed getting more veggies in the diet. Discussed beverage options.  Goals from visit #1 1. Follow My Plate 2. Cut out sodas (met goal 100% since last visit) 3. Drink 4-5 bottles  per day (in progress) 4. Cut chips (in progress - it is hard to do) 5. Walk 30 minutes 5 days a week. (has not started yet) Lose 1 lbs per week  Added goals visit #2 1. Explore other ways to help you feel better, things that might take the place of comfort foods. 2. Continue to having apples for snacks or part of breakfast 3. Continue having vegetables daily, can use for snacks with humus or some other dip.  4. Can try adding frozen berries to sparkling water for another option to water. Herbal teas are another option to get flavor in your water  Teaching Method Utilized:  Visual Auditory  Handouts given during visit include:  none  Barriers to learning/adherence to lifestyle change: none  Demonstrated degree of understanding via:  Teach Back   Monitoring/Evaluation:  Dietary intake, exercise, meal planning , and body weight in 1 month(s).

## 2017-09-26 ENCOUNTER — Encounter: Payer: 59 | Attending: Family Medicine | Admitting: Nutrition

## 2017-09-26 VITALS — Ht 62.0 in | Wt 201.8 lb

## 2017-09-26 DIAGNOSIS — Z6836 Body mass index (BMI) 36.0-36.9, adult: Secondary | ICD-10-CM

## 2017-09-26 DIAGNOSIS — E7800 Pure hypercholesterolemia, unspecified: Secondary | ICD-10-CM

## 2017-09-26 NOTE — Progress Notes (Signed)
Medical Nutrition Therapy:  Appt start time: 3546 end time:  1600.  [employer] [employer] visit 3 of 3  Assessment:  Primary concerns today: "I'm having a headache from caffeine withdrawls." Working on better meal planning. Feels better, More energy. Feels encouraged. Wants to find some new recipes. Reviewed google and pintrest ideas. Will incorporate exercise soon. Lost 3 lbs in the last week.  Wt Readings from Last 3 Encounters:  09/26/17 201 lb 12.8 oz (91.5 kg)  09/25/17 201 lb 1.6 oz (91.2 kg)  09/19/17 204 lb (92.5 kg)   Preferred Learning Style:   No preference indicated   Learning Readiness:  Ready  Change in progress   MEDICATIONS: reviewed   DIETARY INTAKE:  24-hr recall:  B ( AM): 2 boiled eggs, 2 bacon, water Snk ( AM): ritz crackers with pepper jack cheese,  L ( PM): atkins 30 g pro drink Snk ( PM):  none D ( PM):  Meatballs, onions, peppers, sweet potato Snk ( PM): small bowl popcorn Beverages: water  Usual physical activity: ADL  Estimated energy needs: 1200  calories 135 g carbohydrates 90  g protein 33 g fat  Progress Towards Goal(s):  In progress.   Nutritional Diagnosis:  NI-1.5 Excessive energy intake As related to Obesity.  As evidenced by BMI 37.    Intervention:  Nutrition education: Discussed comfort foods and looking for alternatives. Discussed going from motivation to exercise to making it happen. Discussed getting more veggies in the diet. Discussed beverage options.  Goals from visit #1 1. Follow My Plate 2. Cut out sodas (met goal 100% since last visit) 3. Drink 4-5 bottles per day (in progress) 4. Cut chips (in progress - it is hard to do) 5. Walk 30 minutes 5 days a week. (has not started yet) Lose 1 lbs per week  Added goals visit #2 1. Explore other ways to help you feel better, things that might take the place of comfort foods. 2. Continue to having apples for snacks or part of breakfast 3. Continue having vegetables daily,  can use for snacks with humus or some other dip.  4. Can try adding frozen berries to sparkling water for another option to water. Herbal teas are another option to get flavor in your water   Goals Week 3 1. Walk after dinner 3 days per week 2. Try one new recipe this week. 3. Lose 1 lb per week    Try increase high fiber foods. , Teaching Method Utilized:  Visual Auditory  Handouts given during visit include:  none  Barriers to learning/adherence to lifestyle change: none  Demonstrated degree of understanding via:  Teach Back   Monitoring/Evaluation:  Dietary intake, exercise, meal planning , and body weight in PRN.

## 2017-09-26 NOTE — Patient Instructions (Addendum)
Goals 1. Walk after dinner 3 days per week 2. Try one new recipe this week. 3. Lose 1 lb per week Try increase high fiber foods. ,

## 2017-09-26 NOTE — Progress Notes (Signed)
error 

## 2017-09-27 ENCOUNTER — Encounter: Payer: Self-pay | Admitting: Nutrition

## 2018-04-07 IMAGING — MG MM CLIP PLACEMENT
2 series · 2 of 2 positions shown · non-contrast
Comparison: Previous exam(s).

CLINICAL DATA: Status post ultrasound-guided core biopsy of mass in
the 1 o'clock location of the left breast.

EXAM:
DIAGNOSTIC LEFT MAMMOGRAM POST ULTRASOUND BIOPSY

[L ML]
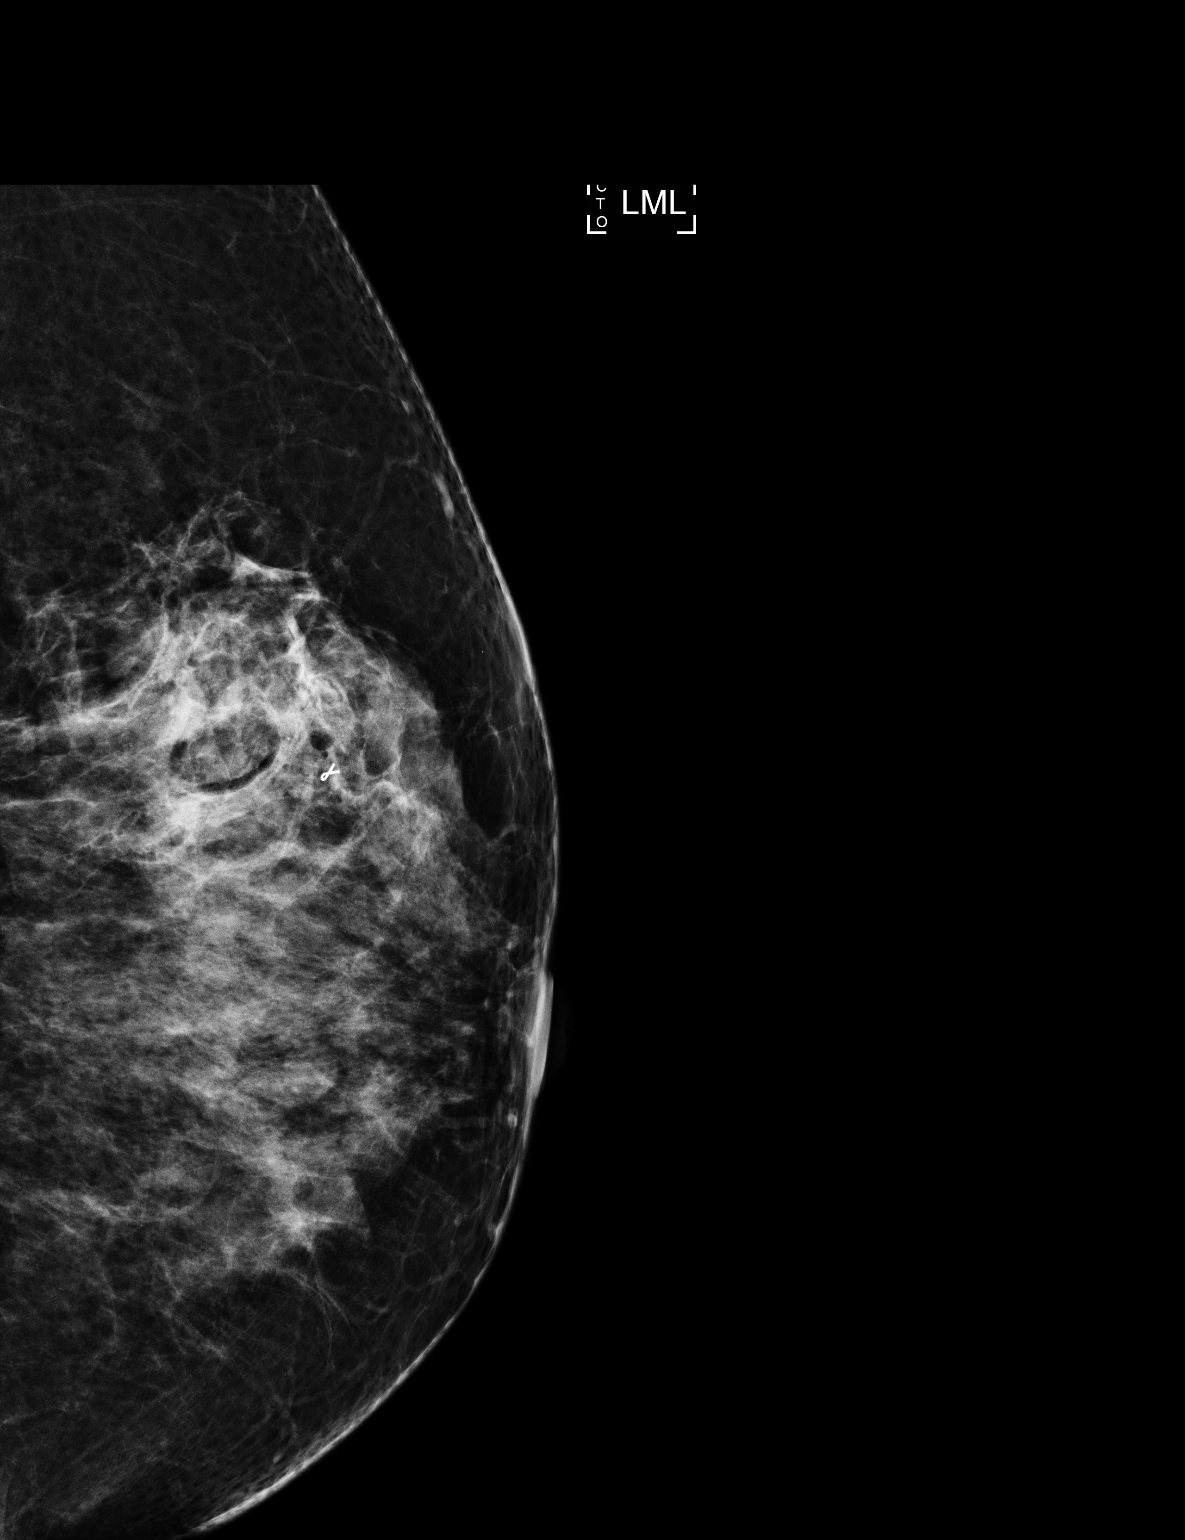

[L CC]
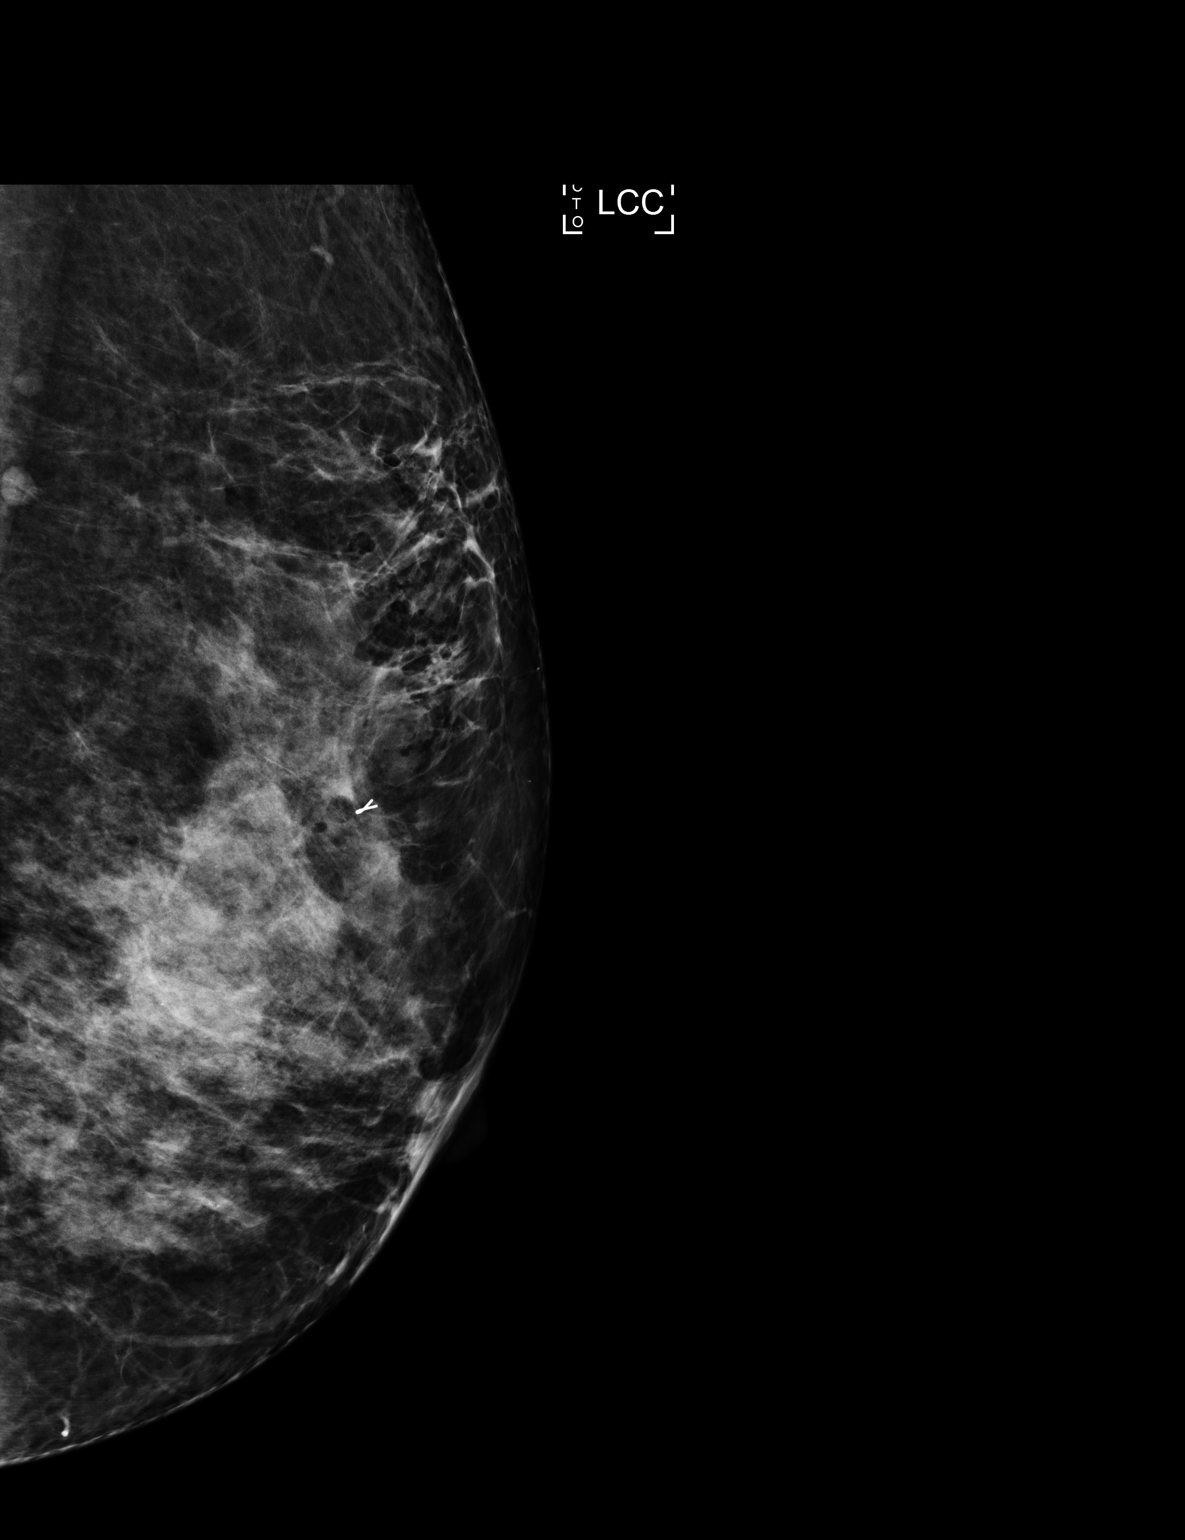

[2 of 2 positions shown; findings below may reference images not displayed]

FINDINGS: Mammographic images were obtained following ultrasound guided biopsy
of mass in the 1 o'clock location of the left breast. A ribbon
shaped clip is identified in the upper-outer quadrant of the left
breast as expected.
IMPRESSION: Tissue marker clip in the expected location following biopsy.

Final Assessment: Post Procedure Mammograms for Marker Placement

## 2018-07-24 ENCOUNTER — Encounter: Payer: Self-pay | Admitting: Family Medicine

## 2018-07-24 ENCOUNTER — Other Ambulatory Visit: Payer: Self-pay

## 2018-07-24 ENCOUNTER — Ambulatory Visit (INDEPENDENT_AMBULATORY_CARE_PROVIDER_SITE_OTHER): Payer: 59 | Admitting: Family Medicine

## 2018-07-24 VITALS — BP 132/80 | HR 81 | Temp 98.4°F | Resp 18 | Ht 62.0 in | Wt 203.4 lb

## 2018-07-24 DIAGNOSIS — Z0001 Encounter for general adult medical examination with abnormal findings: Secondary | ICD-10-CM | POA: Diagnosis not present

## 2018-07-24 DIAGNOSIS — Z6837 Body mass index (BMI) 37.0-37.9, adult: Secondary | ICD-10-CM | POA: Diagnosis not present

## 2018-07-24 DIAGNOSIS — E785 Hyperlipidemia, unspecified: Secondary | ICD-10-CM

## 2018-07-24 DIAGNOSIS — Z0000 Encounter for general adult medical examination without abnormal findings: Secondary | ICD-10-CM

## 2018-07-24 DIAGNOSIS — E661 Drug-induced obesity: Secondary | ICD-10-CM

## 2018-07-24 DIAGNOSIS — Z13220 Encounter for screening for lipoid disorders: Secondary | ICD-10-CM

## 2018-07-24 DIAGNOSIS — Z130 Encounter for screening for diseases of the blood and blood-forming organs and certain disorders involving the immune mechanism: Secondary | ICD-10-CM | POA: Diagnosis not present

## 2018-07-24 NOTE — Patient Instructions (Addendum)
Health Maintenance, Female Adopting a healthy lifestyle and getting preventive care can go a long way to promote health and wellness. Talk with your health care provider about what schedule of regular examinations is right for you. This is a good chance for you to check in with your provider about disease prevention and staying healthy. In between checkups, there are plenty of things you can do on your own. Experts have done a lot of research about which lifestyle changes and preventive measures are most likely to keep you healthy. Ask your health care provider for more information. Weight and diet Eat a healthy diet  Be sure to include plenty of vegetables, fruits, low-fat dairy products, and lean protein.  Do not eat a lot of foods high in solid fats, added sugars, or salt.  Get regular exercise. This is one of the most important things you can do for your health. ? Most adults should exercise for at least 150 minutes each week. The exercise should increase your heart rate and make you sweat (moderate-intensity exercise). ? Most adults should also do strengthening exercises at least twice a week. This is in addition to the moderate-intensity exercise. Maintain a healthy weight  Body mass index (BMI) is a measurement that can be used to identify possible weight problems. It estimates body fat based on height and weight. Your health care provider can help determine your BMI and help you achieve or maintain a healthy weight.  For females 5 years of age and older: ? A BMI below 18.5 is considered underweight. ? A BMI of 18.5 to 24.9 is normal. ? A BMI of 25 to 29.9 is considered overweight. ? A BMI of 30 and above is considered obese. Watch levels of cholesterol and blood lipids  You should start having your blood tested for lipids and cholesterol at 53 years of age, then have this test every 5 years.  You may need to have your cholesterol levels checked more often if: ? Your lipid or  cholesterol levels are high. ? You are older than 53 years of age. ? You are at high risk for heart disease. Cancer screening Lung Cancer  Lung cancer screening is recommended for adults 48-79 years old who are at high risk for lung cancer because of a history of smoking.  A yearly low-dose CT scan of the lungs is recommended for people who: ? Currently smoke. ? Have quit within the past 15 years. ? Have at least a 30-pack-year history of smoking. A pack year is smoking an average of one pack of cigarettes a day for 1 year.  Yearly screening should continue until it has been 15 years since you quit.  Yearly screening should stop if you develop a health problem that would prevent you from having lung cancer treatment. Breast Cancer  Practice breast self-awareness. This means understanding how your breasts normally appear and feel.  It also means doing regular breast self-exams. Let your health care provider know about any changes, no matter how small.  If you are in your 20s or 30s, you should have a clinical breast exam (CBE) by a health care provider every 1-3 years as part of a regular health exam.  If you are 22 or older, have a CBE every year. Also consider having a breast X-ray (mammogram) every year.  If you have a family history of breast cancer, talk to your health care provider about genetic screening.  If you are at high risk for breast cancer, talk  to your health care provider about having an MRI and a mammogram every year.  Breast cancer gene (BRCA) assessment is recommended for women who have family members with BRCA-related cancers. BRCA-related cancers include: ? Breast. ? Ovarian. ? Tubal. ? Peritoneal cancers.  Results of the assessment will determine the need for genetic counseling and BRCA1 and BRCA2 testing. Cervical Cancer Your health care provider may recommend that you be screened regularly for cancer of the pelvic organs (ovaries, uterus, and vagina).  This screening involves a pelvic examination, including checking for microscopic changes to the surface of your cervix (Pap test). You may be encouraged to have this screening done every 3 years, beginning at age 21.  For women ages 30-65, health care providers may recommend pelvic exams and Pap testing every 3 years, or they may recommend the Pap and pelvic exam, combined with testing for human papilloma virus (HPV), every 5 years. Some types of HPV increase your risk of cervical cancer. Testing for HPV may also be done on women of any age with unclear Pap test results.  Other health care providers may not recommend any screening for nonpregnant women who are considered low risk for pelvic cancer and who do not have symptoms. Ask your health care provider if a screening pelvic exam is right for you.  If you have had past treatment for cervical cancer or a condition that could lead to cancer, you need Pap tests and screening for cancer for at least 20 years after your treatment. If Pap tests have been discontinued, your risk factors (such as having a new sexual partner) need to be reassessed to determine if screening should resume. Some women have medical problems that increase the chance of getting cervical cancer. In these cases, your health care provider may recommend more frequent screening and Pap tests. Colorectal Cancer  This type of cancer can be detected and often prevented.  Routine colorectal cancer screening usually begins at 53 years of age and continues through 53 years of age.  Your health care provider may recommend screening at an earlier age if you have risk factors for colon cancer.  Your health care provider may also recommend using home test kits to check for hidden blood in the stool.  A small camera at the end of a tube can be used to examine your colon directly (sigmoidoscopy or colonoscopy). This is done to check for the earliest forms of colorectal cancer.  Routine  screening usually begins at age 50.  Direct examination of the colon should be repeated every 5-10 years through 53 years of age. However, you may need to be screened more often if early forms of precancerous polyps or small growths are found. Skin Cancer  Check your skin from head to toe regularly.  Tell your health care provider about any new moles or changes in moles, especially if there is a change in a mole's shape or color.  Also tell your health care provider if you have a mole that is larger than the size of a pencil eraser.  Always use sunscreen. Apply sunscreen liberally and repeatedly throughout the day.  Protect yourself by wearing long sleeves, pants, a wide-brimmed hat, and sunglasses whenever you are outside. Heart disease, diabetes, and high blood pressure  High blood pressure causes heart disease and increases the risk of stroke. High blood pressure is more likely to develop in: ? People who have blood pressure in the high end of the normal range (130-139/85-89 mm Hg). ? People   who are overweight or obese. ? People who are African American.  If you are 84-22 years of age, have your blood pressure checked every 3-5 years. If you are 67 years of age or older, have your blood pressure checked every year. You should have your blood pressure measured twice-once when you are at a hospital or clinic, and once when you are not at a hospital or clinic. Record the average of the two measurements. To check your blood pressure when you are not at a hospital or clinic, you can use: ? An automated blood pressure machine at a pharmacy. ? A home blood pressure monitor.  If you are between 52 years and 3 years old, ask your health care provider if you should take aspirin to prevent strokes.  Have regular diabetes screenings. This involves taking a blood sample to check your fasting blood sugar level. ? If you are at a normal weight and have a low risk for diabetes, have this test once  every three years after 53 years of age. ? If you are overweight and have a high risk for diabetes, consider being tested at a younger age or more often. Preventing infection Hepatitis B  If you have a higher risk for hepatitis B, you should be screened for this virus. You are considered at high risk for hepatitis B if: ? You were born in a country where hepatitis B is common. Ask your health care provider which countries are considered high risk. ? Your parents were born in a high-risk country, and you have not been immunized against hepatitis B (hepatitis B vaccine). ? You have HIV or AIDS. ? You use needles to inject street drugs. ? You live with someone who has hepatitis B. ? You have had sex with someone who has hepatitis B. ? You get hemodialysis treatment. ? You take certain medicines for conditions, including cancer, organ transplantation, and autoimmune conditions. Hepatitis C  Blood testing is recommended for: ? Everyone born from [birth year] through 1965. ? Anyone with known risk factors for hepatitis C. Sexually transmitted infections (STIs)  You should be screened for sexually transmitted infections (STIs) including gonorrhea and chlamydia if: ? You are sexually active and are younger than 53 years of age. ? You are older than 53 years of age and your health care provider tells you that you are at risk for this type of infection. ? Your sexual activity has changed since you were last screened and you are at an increased risk for chlamydia or gonorrhea. Ask your health care provider if you are at risk.  If you do not have HIV, but are at risk, it may be recommended that you take a prescription medicine daily to prevent HIV infection. This is called pre-exposure prophylaxis (PrEP). You are considered at risk if: ? You are sexually active and do not regularly use condoms or know the HIV status of your partner(s). ? You take drugs by injection. ? You are sexually active with a partner  who has HIV. Talk with your health care provider about whether you are at high risk of being infected with HIV. If you choose to begin PrEP, you should first be tested for HIV. You should then be tested every 3 months for as long as you are taking PrEP. Pregnancy  If you are premenopausal and you may become pregnant, ask your health care provider about preconception counseling.  If you may become pregnant, take 400 to 800 micrograms (mcg) of folic acid every  day.  If you want to prevent pregnancy, talk to your health care provider about birth control (contraception). Osteoporosis and menopause  Osteoporosis is a disease in which the bones lose minerals and strength with aging. This can result in serious bone fractures. Your risk for osteoporosis can be identified using a bone density scan.  If you are 85 years of age or older, or if you are at risk for osteoporosis and fractures, ask your health care provider if you should be screened.  Ask your health care provider whether you should take a calcium or vitamin D supplement to lower your risk for osteoporosis.  Menopause may have certain physical symptoms and risks.  Hormone replacement therapy may reduce some of these symptoms and risks. Talk to your health care provider about whether hormone replacement therapy is right for you. Follow these instructions at home:  Schedule regular health, dental, and eye exams.  Stay current with your immunizations.  Do not use any tobacco products including cigarettes, chewing tobacco, or electronic cigarettes.  If you are pregnant, do not drink alcohol.  If you are breastfeeding, limit how much and how often you drink alcohol.  Limit alcohol intake to no more than 1 drink per day for nonpregnant women. One drink equals 12 ounces of beer, 5 ounces of wine, or 1 ounces of hard liquor.  Do not use street drugs.  Do not share needles.  Ask your health care provider for help if you need support  or information about quitting drugs.  Tell your health care provider if you often feel depressed.  Tell your health care provider if you have ever been abused or do not feel safe at home. This information is not intended to replace advice given to you by your health care provider. Make sure you discuss any questions you have with your health care provider. Document Released: 08/02/2010 Document Revised: 06/25/2015 Document Reviewed: 10/21/2014 Elsevier Interactive Patient Education  2019 [employer]   Preventing Osteoporosis, Adult Osteoporosis is a condition that causes the bones to get weaker. With osteoporosis, the bones become thinner, and the normal spaces in bone tissue become larger. This can make the bones weak and cause them to break more easily. People who have osteoporosis are more likely to break their wrist, spine, or hip. Even a minor accident or injury can be enough to break weak bones. Osteoporosis can occur with aging. Your body constantly replaces old bone tissue with new tissue. As you get older, you may lose bone tissue more quickly, or it may be replaced more slowly. Osteoporosis is more likely to develop if you have poor nutrition or do not get enough calcium or vitamin D. Other lifestyle factors can also play a role. By making some diet and lifestyle changes, you can help to keep your bones healthy and help to prevent osteoporosis. What nutrition changes can be made? Nutrition plays an important role in maintaining healthy, strong bones.  Make sure you get enough calcium every day from food or from calcium supplements. ? If you are age 55 or younger, aim to get 1,000 mg of calcium every day. ? If you are older than age 62, aim to get 1,200 mg of calcium every day.  Try to get enough vitamin D every day. ? If you are age 44 or younger, aim to get 600 international units (IU) every day. ? If you are older than age 58, aim to get 800 international units every  day.  Follow a healthy  diet. Eat plenty of foods that contain calcium and vitamin D. ? Calcium is in milk, cheese, yogurt, and other dairy products. Some fish and vegetables are also good sources of calcium. Many foods such as cereals and breads have had calcium added to them (are fortified). Check nutrition labels to see how much calcium is in a food or drink. ? Foods that contain vitamin D include milk, cereals, salmon, and tuna. Your body also makes vitamin D when you are out in the sun. Bare skin exposure to the sun on your face, arms, legs, or back for no more than 30 minutes a day, 2 times per week is more than enough. Beyond that, it is important to use sunblock to protect your skin from sunburn, which increases your risk for skin cancer. What lifestyle changes can be made? Making changes in your everyday life can also play an important role in preventing osteoporosis.  Stay active and get exercise every day. Ask your health care provider what types of exercise are best for you.  Do not use any products that contain nicotine or tobacco, such as cigarettes and e-cigarettes. If you need help quitting, ask your health care provider.  Limit alcohol intake to no more than 1 drink a day for nonpregnant women and 2 drinks a day for men. One drink equals 12 oz of beer, 5 oz of wine, or 1 oz of hard liquor. Why are these changes important? Making these nutrition and lifestyle changes can:  Help you develop and maintain healthy, strong bones.  Prevent loss of bone mass and the problems that are caused by that loss, such as broken bones and delayed healing.  Make you feel better mentally and physically. What can happen if changes are not made? Problems that can result from osteoporosis can be very serious. These may include:  A higher risk of broken bones that are painful and do not heal well.  Physical malformations, such as a collapsed spine or a hunched back.  Problems with  movement. Where to find support If you need help making changes to prevent osteoporosis, talk with your health care provider. You can ask for a referral to a diet and nutrition specialist (dietitian) and a physical therapist. Where to find more information Learn more about osteoporosis from:  NIH Osteoporosis and Related [place]: www.niams.GolfingGoddess.com.br  [place] Office on Women's Health: SouvenirBaseball.es.html  National Osteoporosis Foundation: ProfilePeek.ch Summary  Osteoporosis is a condition that causes weak bones that are more likely to break.  Eating a healthy diet and making sure you get enough calcium and vitamin D can help prevent osteoporosis.  Other ways to reduce your risk of osteoporosis include getting regular exercise and avoiding alcohol and products that contain nicotine or tobacco. This information is not intended to replace advice given to you by your health care provider. Make sure you discuss any questions you have with your health care provider. Document Released: 02/01/2015 Document Revised: 10/25/2016 Document Reviewed: 09/28/2015 Elsevier Interactive Patient Education  2019 [facility]) Exercise Recommendation  Being physically active is important to prevent heart disease and stroke, the nation's No. 1and No. 5killers. To improve overall cardiovascular health, we suggest at least 150 minutes per week of moderate exercise or 75 minutes per week of vigorous exercise (or a combination of moderate and vigorous activity). Thirty minutes a day, five times a week is an easy goal to remember. You will also experience benefits even if  you divide your time into two or three segments of 10 to 15 minutes per day.  For people who would benefit from lowering their blood pressure or  cholesterol, we recommend 40 minutes of aerobic exercise of moderate to vigorous intensity three to four times a week to lower the risk for heart attack and stroke.  Physical activity is anything that makes you move your body and burn calories.  This includes things like climbing stairs or playing sports. Aerobic exercises benefit your heart, and include walking, jogging, swimming or biking. Strength and stretching exercises are best for overall stamina and flexibility.  The simplest, positive change you can make to effectively improve your heart health is to start walking. It's enjoyable, free, easy, social and great exercise. A walking program is flexible and boasts high success rates because people can stick with it. It's easy for walking to become a regular and satisfying part of life.   For Overall Cardiovascular Health:  At least 30 minutes of moderate-intensity aerobic activity at least 5 days per week for a total of 150  OR   At least 25 minutes of vigorous aerobic activity at least 3 days per week for a total of 75 minutes; or a combination of moderate- and vigorous-intensity aerobic activity  AND   Moderate- to high-intensity muscle-strengthening activity at least 2 days per week for additional health benefits.  For Lowering Blood Pressure and Cholesterol  An average 40 minutes of moderate- to vigorous-intensity aerobic activity 3 or 4 times per week  What if I can't make it to the time goal? Something is always better than nothing! And everyone has to start somewhere. Even if you've been sedentary for years, today is the day you can begin to make healthy changes in your life. If you don't think you'll make it for 30 or 40 minutes, set a reachable goal for today. You can work up toward your overall goal by increasing your time as you get stronger. Don't let all-or-nothing thinking rob you of doing what you can every day.  Source:http://www.heart.org

## 2018-07-24 NOTE — Progress Notes (Signed)
Patient: [name], Female    DOB: 04-06-1965, 53 y.o.   MRN: [id number] Visit Date: 07/24/2018  Today's Provider: Delsa Grana, PA-C   Chief Complaint  Patient presents with  . Annual Exam   Subjective:    Annual physical exam [name] is a 53 y.o. female who presents today for health maintenance and complete physical. She feels well. She reports not exercising.   She reports she is sleeping well.  ----------------------------------------------------------------- GI med hx, she is really careful with her food and drink choices, not on meds, GERD is controlled with diet  Mammogram - is due, will call herself to get her mammogram PAP and other Gyn care with Dr. Elonda Husky  She says she continues to struggle with her weight, believes part of it is her metabolism and aging as well as perimenopause.  No sig weight change over the past year.  She is going to nutritional counseling.  Wt Readings from Last 5 Encounters:  07/24/18 203 lb 6.4 oz (92.3 kg)  09/26/17 201 lb 12.8 oz (91.5 kg)  09/25/17 201 lb 1.6 oz (91.2 kg)  09/19/17 204 lb (92.5 kg)  09/14/17 205 lb (93 kg)   Menses last February, and then this week, has been very irregular  Review of Systems  Constitutional: Negative.   HENT: Negative.   Eyes: Negative.   Respiratory: Negative.   Cardiovascular: Negative.   Gastrointestinal: Negative.   Endocrine: Negative.   Genitourinary: Negative.   Musculoskeletal: Negative.   Skin: Negative.   Allergic/Immunologic: Negative.   Neurological: Negative.   Hematological: Negative.   Psychiatric/Behavioral: Negative.   All other systems reviewed and are negative.   Social History      She  reports that she has never smoked. She has never used smokeless tobacco. She reports that she does not drink alcohol or use drugs.       Social History   Socioeconomic History  . Marital status: Married    Spouse name: Not on file  . Number of children: Not on file  .  Years of education: Not on file  . Highest education level: Not on file  Occupational History  . Occupation: Full time    Employer: [employer]  Social Needs  . Financial resource strain: Not on file  . Food insecurity    Worry: Not on file    Inability: Not on file  . Transportation needs    Medical: Not on file    Non-medical: Not on file  Tobacco Use  . Smoking status: Never Smoker  . Smokeless tobacco: Never Used  Substance and Sexual Activity  . Alcohol use: No  . Drug use: No  . Sexual activity: Yes    Birth control/protection: Surgical  Lifestyle  . Physical activity    Days per week: Not on file    Minutes per session: Not on file  . Stress: Not on file  Relationships  . Social Herbalist on phone: Not on file    Gets together: Not on file    Attends religious service: Not on file    Active member of club or organization: Not on file    Attends meetings of clubs or organizations: Not on file    Relationship status: Not on file  Other Topics Concern  . Not on file  Social History Narrative   No regular exercise    Past Medical History:  Diagnosis Date  . Chronic gastritis   .  Complication of anesthesia   . Depression   . Duodenitis 08/2005  . Dysphagia   . Esophageal dilatation      Patient Active Problem List   Diagnosis Date Noted  . Nutritional counseling 09/25/2017  . Special screening for malignant neoplasms, colon   . Depression with anxiety 08/26/2015  . Lateral epicondylitis 11/29/2013  . Leiomyoma of uterus, unspecified 09/23/2013  . Mixed stress and urge urinary incontinence 07/16/2012  . Essential hypertension, benign 07/16/2012  . Obesity 07/16/2012  . Insomnia 07/16/2012  . Seasonal depression ([place]) 07/16/2012  . ELECTROCARDIOGRAM, ABNORMAL 01/05/2010  . ESOPHAGEAL REFLUX 09/11/2007  . GASTRITIS, ANTRAL 09/11/2007  . HEADACHES, HX OF 09/11/2007    Past Surgical History:  Procedure Laterality Date  . BREAST  BIOPSY Right 04/26/2013   Procedure: BREAST BIOPSY TIMES TWO;  Surgeon: Jamesetta So, MD;  Location: [facility];  Service: General;  Laterality: Right;  . BREAST MASS EXCISION     bilateral tumor removal  . COLONOSCOPY N/A 01/27/2016   Procedure: COLONOSCOPY;  Surgeon: Danie Binder, MD;  Location: [facility];  Service: Endoscopy;  Laterality: N/A;  830  . DILITATION & CURRETTAGE/HYSTROSCOPY WITH THERMACHOICE ABLATION N/A 10/11/2013   Procedure: DILATATION & CURETTAGE/HYSTEROSCOPY WITH THERMACHOICE ABLATION;  Surgeon: Florian Buff, MD;  Location: [facility];  Service: Gynecology;  Laterality: N/A;  total therapy time- 20 minutes temp  76 deg C  . ESOPHAGEAL DILATION  08/2005  . ESOPHAGOGASTRODUODENOSCOPY N/A 01/27/2016   Procedure: ESOPHAGOGASTRODUODENOSCOPY (EGD);  Surgeon: Danie Binder, MD;  Location: [facility];  Service: Endoscopy;  Laterality: N/A;  . SAVORY DILATION N/A 01/27/2016   Procedure: SAVORY DILATION;  Surgeon: Danie Binder, MD;  Location: [facility];  Service: Endoscopy;  Laterality: N/A;  . TUBAL LIGATION    . WISDOM TOOTH EXTRACTION      Family History        Family Status  Relation Name Status  . Father  Alive  . Mother  Alive       2 different aneurysms - thoracic abdominal?  . Sister  Alive  . Ethlyn Daniels  Deceased  . Annamarie Major  Deceased  . Cousin  Deceased        Her family history includes Cancer in her cousin, paternal aunt, and paternal uncle; Heart disease in her father and mother; Hypertension in her father; Stroke in her father.      Allergies  Allergen Reactions  . Lisinopril Cough    No current outpatient medications on file.   Patient Care Team: Cullison, Modena Nunnery, MD as PCP - General (Family Medicine)      Objective:   Vitals: BP 132/80   Pulse 81   Temp 98.4 F (36.9 C)   Resp 18   Ht 5\' 2"  (1.575 m)   Wt 203 lb 6.4 oz (92.3 kg)   SpO2 95%   BMI 37.20 kg/m      Physical Exam Vitals signs and nursing note reviewed.   Constitutional:      General: She is not in acute distress.    Appearance: Normal appearance. She is well-developed. She is obese. She is not ill-appearing, toxic-appearing or diaphoretic.  HENT:     Head: Normocephalic and atraumatic.     Right Ear: External ear normal.     Left Ear: External ear normal.     Nose: Nose normal. No congestion.     Mouth/Throat:     Mouth: Mucous membranes are moist.  Pharynx: Oropharynx is clear. Uvula midline. No oropharyngeal exudate.  Eyes:     General: Lids are normal. No scleral icterus.    Conjunctiva/sclera: Conjunctivae normal.     Pupils: Pupils are equal, round, and reactive to light.  Neck:     Musculoskeletal: Normal range of motion and neck supple.     Thyroid: No thyromegaly.     Trachea: Phonation normal. No tracheal deviation.  Cardiovascular:     Rate and Rhythm: Normal rate and regular rhythm.     Pulses: Normal pulses.          Radial pulses are 2+ on the right side and 2+ on the left side.       Posterior tibial pulses are 2+ on the right side and 2+ on the left side.     Heart sounds: Normal heart sounds. No murmur. No friction rub. No gallop.   Pulmonary:     Effort: Pulmonary effort is normal. No respiratory distress.     Breath sounds: Normal breath sounds. No stridor. No wheezing, rhonchi or rales.  Chest:     Chest wall: No tenderness.  Abdominal:     General: Bowel sounds are normal. There is no distension.     Palpations: Abdomen is soft.     Tenderness: There is no abdominal tenderness. There is no guarding or rebound.  Musculoskeletal: Normal range of motion.        General: No deformity.  Lymphadenopathy:     Cervical: No cervical adenopathy.  Skin:    General: Skin is warm and dry.     Capillary Refill: Capillary refill takes less than 2 seconds.     Coloration: Skin is not jaundiced or pale.     Findings: No rash.  Neurological:     Mental Status: She is alert.     Motor: No abnormal muscle tone.      Coordination: Coordination normal.     Gait: Gait normal.  Psychiatric:        Speech: Speech normal.        Behavior: Behavior normal.        Thought Content: Thought content normal.        Judgment: Judgment normal.     Comments: Slightly anxious and guarded      Depression Screen PHQ 2/9 Scores 07/24/2018 09/25/2017 09/19/2017 07/14/2017  PHQ - 2 Score 0 0 0 0     Office Visit from 07/24/2018 in [facility]  AUDIT-C Score  0         Assessment & Plan:     Routine Health Maintenance and Physical Exam  Exercise Activities and Dietary recommendations Goals - continue diet efforts (meeting with nutritionist) increase aerobic activity      Discussed health benefits of physical activity, and encouraged her to engage in regular exercise appropriate for her age and condition.   Immunization History  Administered Date(s) Administered  . Td 01/16/2013    Health Maintenance  Topic Date Due  . MAMMOGRAM  11/11/2017  . PAP SMEAR-Modifier  07/24/2018 (Originally 08/23/2016)  . HIV Screening  07/24/2019 (Originally 10/24/1980)  . INFLUENZA VACCINE  09/01/2018  . TETANUS/TDAP  01/17/2023  . COLONOSCOPY  01/26/2026    Problem List Items Addressed This Visit      Other   Obesity   Relevant Orders   COMPLETE METABOLIC PANEL WITH GFR   Hemoglobin A1c   Lipid panel   TSH   T3, free   T4, free  T3, free   T4, free   CBC with Differential/Platelet   COMPLETE METABOLIC PANEL WITH GFR   Hemoglobin A1c   Lipid panel   TSH    Other Visit Diagnoses    Routine general medical examination at a health care facility    -  Primary   Relevant Orders   CBC with Differential/Platelet   COMPLETE METABOLIC PANEL WITH GFR   Hemoglobin A1c   Lipid panel   TSH   T3, free   T4, free   T3, free   T4, free   CBC with Differential/Platelet   COMPLETE METABOLIC PANEL WITH GFR   Hemoglobin A1c   Lipid panel   TSH   Screening for deficiency anemia       Relevant  Orders   CBC with Differential/Platelet   CBC with Differential/Platelet   Screening for lipoid disorders       Relevant Orders   COMPLETE METABOLIC PANEL WITH GFR   Lipid panel   Lipid panel   Hyperlipidemia, unspecified hyperlipidemia type       Relevant Orders   COMPLETE METABOLIC PANEL WITH GFR   Lipid panel   Lipid panel     Future fasting labs placed here at our clinic, then asked to be able to get done at a different lab closer to her with longer hours (duplicate orders)   Health maintenance handout, bone health and AHA exercise recommendation hand out given.   Delsa Grana, PA-C 07/24/18 4:27 PM  [facility]

## 2018-08-15 ENCOUNTER — Other Ambulatory Visit: Payer: Self-pay

## 2018-08-15 ENCOUNTER — Encounter: Payer: 59 | Attending: Family Medicine | Admitting: Nutrition

## 2018-08-15 NOTE — Progress Notes (Signed)
First Visit. 21395 Medical Nutrition Therapy:  Appt start time: 0800 end time:  0830.   Assessment:  Primary concerns today: Obesity and PreDM.  Empl ID [id number].  Desures weight loss. Wants to lose weight down to 170 lbs. Wants to lose down to 170-180 lbs. Eats 2-3 meals.  ls ready to start meal planning and working on exercise. A1C 5.8%. Very busy and stressful at work. Willing to make changes with hier diet and excerceise. Doesn't usually eat breakfast.  Preferred Learning Style:    Hands on  No preference indicated   Learning Readiness:    Ready  Change in progress   MEDICATIONS:   DIETARY INTAKE: 24-hr recall:  B ( AM): cheese crackers, or pb crackers, fruit. OR biscuit bacon, egg and cheese  Snk ( AM):  L ( PM): Toss salad, water,  Snk ( PM):  D ( PM): Pinto beans- 1 cup, cornbread-2" , sweet tea Snk ( PM):  Beverages: water, sweet tea.  Usual physical activity: ADL  Estimated energy needs: 1200  calories 135 g carbohydrates 90 g protein 33 g fat  Progress Towards Goal(s):  In progress.   Nutritional Diagnosis:  NB-1.1 Food and nutrition-related knowledge deficit As related to Obesity and PreDM.  As evidenced by BMI 36 and A1C 5.7%.    Intervention:  Nutrition and Diabetes education provided on My Plate, CHO counting, meal planning, portion sizes, timing of meals, avoiding snacks between meals unless having a low blood sugar, target ranges for A1C and blood sugars, signs/symptoms and treatment of hyper/hypoglycemia, monitoring blood sugars, taking medications as prescribed, benefits of exercising 30 minutes per day and prevention of complications of DM.  Marland KitchenGoals Follow My Plate Walk 30 minutes three days a week. Plan and prep meals for the week and for breakfast Drink 16 oz of water first thing in morning. Drink 80 oz of water per day Increase low carb vegetables with lunch and dinner. Lose 1 lbs per week. Use http://thomas.info/ and journal  Teaching  Method Utilized: Phone visit Visual Auditory Hands on  Handouts given during visit include:  Emailed My Plate, Weight loss strategies,   Barriers to learning/adherence to lifestyle change: none  Demonstrated degree of understanding via:  Teach Back   Monitoring/Evaluation:  Dietary intake, exercise, , and body weight in 2 week(s).

## 2018-08-15 NOTE — Patient Instructions (Signed)
Goals Follow My Plate Walk 30 minutes three days a week. Plan and prep meals for the week and for breakfast Drink 16 oz of water first thing in morning. Drink 80 oz of water per day Increase low carb vegetables with lunch and dinner. Lose 1 lbs per week. Use http://thomas.info/ and journal.

## 2018-08-24 ENCOUNTER — Other Ambulatory Visit: Payer: Self-pay | Admitting: Family Medicine

## 2018-08-24 DIAGNOSIS — Z130 Encounter for screening for diseases of the blood and blood-forming organs and certain disorders involving the immune mechanism: Secondary | ICD-10-CM | POA: Diagnosis not present

## 2018-08-24 DIAGNOSIS — Z13220 Encounter for screening for lipoid disorders: Secondary | ICD-10-CM | POA: Diagnosis not present

## 2018-08-24 DIAGNOSIS — Z6837 Body mass index (BMI) 37.0-37.9, adult: Secondary | ICD-10-CM | POA: Diagnosis not present

## 2018-08-24 DIAGNOSIS — I10 Essential (primary) hypertension: Secondary | ICD-10-CM | POA: Diagnosis not present

## 2018-08-24 DIAGNOSIS — E661 Drug-induced obesity: Secondary | ICD-10-CM | POA: Diagnosis not present

## 2018-08-24 DIAGNOSIS — Z0000 Encounter for general adult medical examination without abnormal findings: Secondary | ICD-10-CM | POA: Diagnosis not present

## 2018-08-25 LAB — COMPLETE METABOLIC PANEL WITH GFR
AG Ratio: 1.6 (calc) (ref 1.0–2.5)
ALT: 15 U/L (ref 6–29)
AST: 16 U/L (ref 10–35)
Albumin: 4.5 g/dL (ref 3.6–5.1)
Alkaline phosphatase (APISO): 75 U/L (ref 37–153)
BUN: 10 mg/dL (ref 7–25)
CO2: 23 mmol/L (ref 20–32)
Calcium: 9.1 mg/dL (ref 8.6–10.4)
Chloride: 106 mmol/L (ref 98–110)
Creat: 0.65 mg/dL (ref 0.50–1.05)
GFR, Est African American: 118 mL/min/{1.73_m2} (ref >=60)
GFR, Est Non African American: 102 mL/min/{1.73_m2} (ref >=60)
Globulin: 2.8 g/dL (calc) (ref 1.9–3.7)
Glucose, Bld: 134 mg/dL — ABNORMAL HIGH (ref 65–99)
Potassium: 4 mmol/L (ref 3.5–5.3)
Sodium: 140 mmol/L (ref 135–146)
Total Bilirubin: 0.5 mg/dL (ref 0.2–1.2)
Total Protein: 7.3 g/dL (ref 6.1–8.1)

## 2018-08-25 LAB — HEMOGLOBIN A1C
Hgb A1c MFr Bld: 5.9 % of total Hgb — ABNORMAL HIGH (ref <5.7)
Mean Plasma Glucose: 123 (calc)
eAG (mmol/L): 6.8 (calc)

## 2018-08-25 LAB — CBC
HCT: 41.3 % (ref 35.0–45.0)
Hemoglobin: 13.9 g/dL (ref 11.7–15.5)
MCH: 28.7 pg (ref 27.0–33.0)
MCHC: 33.7 g/dL (ref 32.0–36.0)
MCV: 85.2 fL (ref 80.0–100.0)
MPV: 10.2 fL (ref 7.5–12.5)
Platelets: 338 10*3/uL (ref 140–400)
RBC: 4.85 10*6/uL (ref 3.80–5.10)
RDW: 13.8 % (ref 11.0–15.0)
WBC: 6.8 10*3/uL (ref 3.8–10.8)

## 2018-08-25 LAB — LIPID PANEL
Cholesterol: 176 mg/dL (ref <200)
HDL: 38 mg/dL — ABNORMAL LOW (ref >=50)
LDL Cholesterol (Calc): 113 mg/dL (calc) — ABNORMAL HIGH
Non-HDL Cholesterol (Calc): 138 mg/dL (calc) — ABNORMAL HIGH (ref <130)
Total CHOL/HDL Ratio: 4.6 (calc) (ref <5.0)
Triglycerides: 135 mg/dL (ref <150)

## 2018-08-25 LAB — T4, FREE: Free T4: 1.3 ng/dL (ref 0.8–1.8)

## 2018-08-25 LAB — TSH: TSH: 2.35 mIU/L

## 2018-08-25 LAB — T3, FREE: T3, Free: 3.4 pg/mL (ref 2.3–4.2)

## 2018-08-30 ENCOUNTER — Encounter: Payer: Self-pay | Admitting: *Deleted

## 2018-08-30 ENCOUNTER — Other Ambulatory Visit: Payer: Self-pay

## 2018-08-30 ENCOUNTER — Encounter: Payer: 59 | Attending: Family Medicine | Admitting: Nutrition

## 2018-08-30 ENCOUNTER — Encounter: Payer: Self-pay | Admitting: Nutrition

## 2018-08-30 DIAGNOSIS — Z130 Encounter for screening for diseases of the blood and blood-forming organs and certain disorders involving the immune mechanism: Secondary | ICD-10-CM

## 2018-08-30 DIAGNOSIS — Z0000 Encounter for general adult medical examination without abnormal findings: Secondary | ICD-10-CM

## 2018-08-30 DIAGNOSIS — Z13220 Encounter for screening for lipoid disorders: Secondary | ICD-10-CM

## 2018-08-30 DIAGNOSIS — E785 Hyperlipidemia, unspecified: Secondary | ICD-10-CM

## 2018-08-30 NOTE — Progress Notes (Signed)
Second  Visit. 21395 Medical Nutrition Therapy:  Appt start time: 0800 end time:  0830.   Assessment:  Primary concerns today: Obesity and PreDM.  Empl ID [id number].  A1C up to 5.9% from 5.8%. Has been meal prepping and meal planning meals better. Lost  2 lbs.Working on more fresh fruits and vegetabes and whole grains. Has done some walking but not a lot due to the heat.  Desures weight loss. Wants to lose weight down to 170 lbs. Lab Results  Component Value Date   HGBA1C 5.9 (H) 08/24/2018   CMP Latest Ref Rng & Units 08/24/2018 07/10/2017 02/27/2016  Glucose 65 - 99 mg/dL 134(H) 141(H) 104(H)  BUN 7 - 25 mg/dL 10 12 13   Creatinine 0.50 - 1.05 mg/dL 0.65 0.69 0.65  Sodium 135 - 146 mmol/L 140 139 141  Potassium 3.5 - 5.3 mmol/L 4.0 4.3 4.0  Chloride 98 - 110 mmol/L 106 107 109  CO2 20 - 32 mmol/L 23 24 24   Calcium 8.6 - 10.4 mg/dL 9.1 9.3 8.7  Total Protein 6.1 - 8.1 g/dL 7.3 7.3 6.9  Total Bilirubin 0.2 - 1.2 mg/dL 0.5 0.5 0.5  Alkaline Phos 33 - 130 U/L - - 64  AST 10 - 35 U/L 16 13 14   ALT 6 - 29 U/L 15 12 11    Lipid Panel     Component Value Date/Time   CHOL 176 08/24/2018 0815   TRIG 135 08/24/2018 0815   HDL 38 (L) 08/24/2018 0815   CHOLHDL 4.6 08/24/2018 0815   VLDL 19 02/27/2016 0846   LDLCALC 113 (H) 08/24/2018 0815    Preferred Learning Style:    Hands on  No preference indicated   Learning Readiness:    Ready  Change in progress   MEDICATIONS:   DIETARY INTAKE: 24-hr recall:  B ( AM): almonds, apple and pb or watermelon, celery sticks and peanut butter Snk ( AM):  L ( PM):left overs from dinner, Snk ( PM):  D ( PM):meat and veggies, water Snk ( PM):  Beverages: water,  Usual physical activity: ADL  Estimated energy needs: 1200  calories 135 g carbohydrates 90 g protein 33 g fat  Progress Towards Goal(s):  In progress.   Nutritional Diagnosis:  NB-1.1 Food and nutrition-related knowledge deficit As related to Obesity and PreDM.  As  evidenced by BMI 36 and A1C 5.7%.    Intervention:  Nutrition and Diabetes education provided on My Plate, CHO counting, meal planning, portion sizes, timing of meals, avoiding snacks between meals unless having a low blood sugar, target ranges for A1C and blood sugars, signs/symptoms and treatment of hyper/hypoglycemia, monitoring blood sugars, taking medications as prescribed, benefits of exercising 30 minutes per day and prevention of complications of DM.  Marland KitchenGoals Keep up the great job! Work on [employer] physical activity Keep meal planning and prepping. Lose 1-2 lbs per week  Teaching Method Utilized: Visual Auditory Hands on  Handouts given during visit include:  Emailed My fitness pal., weight loss strategies,     Barriers to learning/adherence to lifestyle change: none  Demonstrated degree of understanding via:  Teach Back   Monitoring/Evaluation:  Dietary intake, exercise, , and body weight in 1 month(s).

## 2018-08-30 NOTE — Patient Instructions (Addendum)
.  Goals Work on [employer] physical activity Keep meal planning and prepping. Lose 1-2 lbs per week

## 2018-09-13 ENCOUNTER — Other Ambulatory Visit: Payer: Self-pay

## 2018-09-13 ENCOUNTER — Telehealth: Payer: Self-pay | Admitting: Nutrition

## 2018-09-13 ENCOUNTER — Encounter: Payer: Self-pay | Admitting: Nutrition

## 2018-09-13 ENCOUNTER — Encounter: Payer: 59 | Attending: Family Medicine | Admitting: Nutrition

## 2018-09-13 VITALS — Ht 62.0 in | Wt 197.5 lb

## 2018-09-13 DIAGNOSIS — E669 Obesity, unspecified: Secondary | ICD-10-CM

## 2018-09-13 DIAGNOSIS — R739 Hyperglycemia, unspecified: Secondary | ICD-10-CM

## 2018-09-13 DIAGNOSIS — Z6836 Body mass index (BMI) 36.0-36.9, adult: Secondary | ICD-10-CM

## 2018-09-13 DIAGNOSIS — E7800 Pure hypercholesterolemia, unspecified: Secondary | ICD-10-CM

## 2018-09-13 NOTE — Progress Notes (Signed)
Third  Visit. 21395 Medical Nutrition Therapy:  Appt start time: 0815nd time:  0830.   Assessment:  Primary concerns today: Obesity and PreDM.  Empl ID [id number].  A1C up to 5.9% from 5.8%. Hasnt been out to eat, adding more vegetables and only 1 soda every other week. Has been meal prepping and meal planning meals better. Lost  2 lbs.Working on more fresh fruits and vegetabes and whole grains. Has done some walking but not a lot due to the heat.  Desures weight loss. Wants to lose weight down to 170 lbs. Lab Results  Component Value Date   HGBA1C 5.9 (H) 08/24/2018   CMP Latest Ref Rng & Units 08/24/2018 07/10/2017 02/27/2016  Glucose 65 - 99 mg/dL 134(H) 141(H) 104(H)  BUN 7 - 25 mg/dL 10 12 13   Creatinine 0.50 - 1.05 mg/dL 0.65 0.69 0.65  Sodium 135 - 146 mmol/L 140 139 141  Potassium 3.5 - 5.3 mmol/L 4.0 4.3 4.0  Chloride 98 - 110 mmol/L 106 107 109  CO2 20 - 32 mmol/L 23 24 24   Calcium 8.6 - 10.4 mg/dL 9.1 9.3 8.7  Total Protein 6.1 - 8.1 g/dL 7.3 7.3 6.9  Total Bilirubin 0.2 - 1.2 mg/dL 0.5 0.5 0.5  Alkaline Phos 33 - 130 U/L - - 64  AST 10 - 35 U/L 16 13 14   ALT 6 - 29 U/L 15 12 11    Lipid Panel     Component Value Date/Time   CHOL 176 08/24/2018 0815   TRIG 135 08/24/2018 0815   HDL 38 (L) 08/24/2018 0815   CHOLHDL 4.6 08/24/2018 0815   VLDL 19 02/27/2016 0846   LDLCALC 113 (H) 08/24/2018 0815    Preferred Learning Style:    Hands on  No preference indicated   Learning Readiness:    Ready  Change in progress   MEDICATIONS:   DIETARY INTAKE: 24-hr recall:  B ( AM): almonds, apple and pb or watermelon, celery sticks and peanut butter Snk ( AM):  L ( PM):left overs from dinner, Snk ( PM):  D ( PM):meat and veggies, water Snk ( PM):  Beverages: water,  Usual physical activity: ADL  Estimated energy needs: 1200  calories 135 g carbohydrates 90 g protein 33 g fat  Progress Towards Goal(s):  In progress.   Nutritional Diagnosis:  NB-1.1 Food  and nutrition-related knowledge deficit As related to Obesity and PreDM.  As evidenced by BMI 36 and A1C 5.7%.    Intervention:  Nutrition and Diabetes education provided on My Plate, CHO counting, meal planning, portion sizes, timing of meals, avoiding snacks between meals unless having a low blood sugar, target ranges for A1C and blood sugars, signs/symptoms and treatment of hyper/hypoglycemia, monitoring blood sugars, taking medications as prescribed, benefits of exercising 30 minutes per day and prevention of complications of DM.  Marland KitchenGoals Keep up the great job! Work on [employer] physical activity Keep meal planning and prepping. Lose 1-2 lbs per week Cut out soda completely.  Teaching Method Utilized: Visual Auditory Hands on  Handouts given during visit include:  Emailed My fitness pal., weight loss strategies,     Barriers to learning/adherence to lifestyle change: none  Demonstrated degree of understanding via:  Teach Back   Monitoring/Evaluation:  Dietary intake, exercise, , and body weight PRN.

## 2018-09-13 NOTE — Patient Instructions (Signed)
.  Goals Keep up the great job! Work on [employer] physical activity Keep meal planning and prepping. Lose 1-2 lbs per week Cut out soda completely.

## 2018-09-13 NOTE — Telephone Encounter (Signed)
vm left to call to do phone visit.

## 2019-04-26 ENCOUNTER — Telehealth: Payer: Self-pay | Admitting: Gastroenterology

## 2019-04-26 MED ORDER — KETOCONAZOLE 200 MG PO TABS: ORAL_TABLET | ORAL | 1 refills | Status: DC

## 2019-04-26 NOTE — Telephone Encounter (Signed)
Samples given.  

## 2019-04-26 NOTE — Telephone Encounter (Signed)
PT HAVING TROUBLE WITH CONSIPATION. REQUESTS SAMPLES OF LINZ[employer]. GIVE LINZESS 145 MCG #8 AND LEAVE AT FRONT DESK FOR PICKUP.

## 2019-05-24 ENCOUNTER — Encounter: Payer: Self-pay | Admitting: Family Medicine

## 2019-07-11 ENCOUNTER — Ambulatory Visit: Payer: 59 | Admitting: Registered"

## 2019-07-23 ENCOUNTER — Other Ambulatory Visit: Payer: Self-pay

## 2019-07-23 ENCOUNTER — Encounter: Payer: 59 | Attending: Family Medicine | Admitting: Nutrition

## 2019-07-23 ENCOUNTER — Encounter: Payer: Self-pay | Admitting: Nutrition

## 2019-07-23 VITALS — Ht 62.0 in | Wt 189.6 lb

## 2019-07-23 DIAGNOSIS — E669 Obesity, unspecified: Secondary | ICD-10-CM | POA: Insufficient documentation

## 2019-07-23 DIAGNOSIS — E7800 Pure hypercholesterolemia, unspecified: Secondary | ICD-10-CM | POA: Insufficient documentation

## 2019-07-23 DIAGNOSIS — R739 Hyperglycemia, unspecified: Secondary | ICD-10-CM | POA: Insufficient documentation

## 2019-07-23 NOTE — Patient Instructions (Signed)
Goals  Use Myfitness [email or web address].pt to track food journal and activity Use Map my Walk to track exercise Aim for 10, 000 steps.  Increase high fiber foods. Eat 2 CHO choices per meal Cut down on processed and salty foods. Increase water to 1 gallon per day

## 2019-07-23 NOTE — Progress Notes (Signed)
Medical Nutrition Therapy:  Appt start time: 1400 end time:  1430.  Employee ID [id number]  Assessment:  Primary concerns today: First visit:  Overweight,  Scheduled to see Dr. Buelah Manis next week. Has hit a platue for weight loss..  Has lost 8 lbs in the last 10 months. Working on portion sizes right now. Getting protein in am. Cut out sodas and drinking more water and some protein shakes.  Uses FB to be accountalbe for her weight loss journey..  Taking a picture of what she eats. Exercise:  Needs to work on it. Walks a little bit.  Wants to lose another 10-20 lbs. Diet needs better consistency, more fiber and less fat and sodium and fresh fruits/vegetables.     Lab Results  Component Value Date   HGBA1C 5.9 (H) 08/24/2018   CMP Latest Ref Rng & Units 08/24/2018 07/10/2017 02/27/2016  Glucose 65 - 99 mg/dL 134(H) 141(H) 104(H)  BUN 7 - 25 mg/dL 10 12 13   Creatinine 0.50 - 1.05 mg/dL 0.65 0.69 0.65  Sodium 135 - 146 mmol/L 140 139 141  Potassium 3.5 - 5.3 mmol/L 4.0 4.3 4.0  Chloride 98 - 110 mmol/L 106 107 109  CO2 20 - 32 mmol/L 23 24 24   Calcium 8.6 - 10.4 mg/dL 9.1 9.3 8.7  Total Protein 6.1 - 8.1 g/dL 7.3 7.3 6.9  Total Bilirubin 0.2 - 1.2 mg/dL 0.5 0.5 0.5  Alkaline Phos 33 - 130 U/L - - 64  AST 10 - 35 U/L 16 13 14   ALT 6 - 29 U/L 15 12 11    Lipid Panel     Component Value Date/Time   CHOL 176 08/24/2018 0815   TRIG 135 08/24/2018 0815   HDL 38 (L) 08/24/2018 0815   CHOLHDL 4.6 08/24/2018 0815   VLDL 19 02/27/2016 0846   LDLCALC 113 (H) 08/24/2018 0815    Preferred Learning Style:  No preference indicated   Learning Readiness:   Ready  Change in progress   MEDICATIONS:   DIETARY INTAKE:   24-hr recall:  B ( AM): 3 slices bacon, 2 boiled eggs with flavored water "ICE " Snk ( AM): walnuts  L ( PM): smoked oysters and saltines    Snk ( PM):  D ( PM): Side salad and shrimp and water Snk ( PM):  Beverages: water  Usual physical activity: walk some    Estimated energy needs: 1200 calories 135 g carbohydrates 90 g protein 33 g fat  Progress Towards Goal(s):  In progress.   Nutritional Diagnosis:  -3.3 Overweight/obesity As related to inconsistent macronutrients.  As evidenced by BMI 34 and PreDm with A1C 5.9%.    Intervention:  Nutrition and Pre Diabetes education provided on My Plate, CHO counting, meal planning, portion sizes, timing of meals, avoiding snacks between meals, target ranges for A1C and blood sugars, signs/symptoms and treatment of hyper/hypoglycemia,  benefits of exercising 60 minutes times per week and prevention of DM. Weight loss tips.  .  Goals  Use My[employer]itness [email or web address].pt to track food journal and activity Use Map my Walk to track exercise Aim for 10, 000 steps.  Increase high fiber foods. Eat 2 CHO choices per meal Cut down on processed and salty foods. Increase water to 1 gallon per day    Teaching Method Utilized:  Visual Auditory Hands on  Handouts given during visit include:  The Plate Method  Weight loss tips   Mindful eating.   Barriers to learning/adherence to lifestyle change:   Demonstrated degree  of understanding via:  Teach Back   Monitoring/Evaluation:  Dietary intake, exercise, , and body weight in 1 month(s).

## 2019-07-29 ENCOUNTER — Encounter: Payer: Self-pay | Admitting: Family Medicine

## 2019-07-29 ENCOUNTER — Ambulatory Visit (INDEPENDENT_AMBULATORY_CARE_PROVIDER_SITE_OTHER): Payer: 59 | Admitting: Family Medicine

## 2019-07-29 ENCOUNTER — Other Ambulatory Visit: Payer: Self-pay

## 2019-07-29 VITALS — BP 132/78 | HR 76 | Temp 98.2°F | Resp 14 | Ht 62.0 in | Wt 193.0 lb

## 2019-07-29 DIAGNOSIS — Z1231 Encounter for screening mammogram for malignant neoplasm of breast: Secondary | ICD-10-CM | POA: Diagnosis not present

## 2019-07-29 DIAGNOSIS — E7439 Other disorders of intestinal carbohydrate absorption: Secondary | ICD-10-CM

## 2019-07-29 DIAGNOSIS — Z0001 Encounter for general adult medical examination with abnormal findings: Secondary | ICD-10-CM

## 2019-07-29 DIAGNOSIS — Z0000 Encounter for general adult medical examination without abnormal findings: Secondary | ICD-10-CM | POA: Diagnosis not present

## 2019-07-29 DIAGNOSIS — G8929 Other chronic pain: Secondary | ICD-10-CM | POA: Diagnosis not present

## 2019-07-29 DIAGNOSIS — N951 Menopausal and female climacteric states: Secondary | ICD-10-CM

## 2019-07-29 DIAGNOSIS — Z1159 Encounter for screening for other viral diseases: Secondary | ICD-10-CM

## 2019-07-29 DIAGNOSIS — M545 Low back pain, unspecified: Secondary | ICD-10-CM

## 2019-07-29 DIAGNOSIS — M79644 Pain in right finger(s): Secondary | ICD-10-CM

## 2019-07-29 DIAGNOSIS — Z6835 Body mass index (BMI) 35.0-35.9, adult: Secondary | ICD-10-CM

## 2019-07-29 NOTE — Patient Instructions (Addendum)
Schedule your mammogram  Call and schedule with GYN  We will call with lab results  F/U 1 year for physical

## 2019-07-29 NOTE — Progress Notes (Signed)
Subjective:    Patient ID: [name], female    DOB: [birth year], 54 y.o.   MRN: [id number]  Patient presents for Annual Exam (is not fating)   Pt here for CPE    Follows with dietician- on low carb diet with higher protein    Follows with dentist every 6 months   No vision issues    GYN- followed by [facility] but due for PAP SMEAR  Menstrual cycle every 2 months, but skips    Low back for the past month more constant, no no radiating symptoms   No tingling numbness in feet  does not need meds    Due for mammogram    Colonoscopy UTD   Immunizations- TDAP/COVID-19 , declines shingles vaccine at this time     Glucose intolerance - last A1C 5.9%, 1 year ago    Right thumb pain for many months, no swelling, n injury, no numbness or tingling in finger tips, has just pain intermittantly, works as Air traffic controller work         Review Of Systems:  GEN- denies fatigue, fever, weight loss,weakness, recent illness HEENT- denies eye drainage, change in vision, nasal discharge, CVS- denies chest pain, palpitations RESP- denies SOB, cough, wheeze ABD- denies N/V, change in stools, abd pain GU- denies dysuria, hematuria, dribbling, incontinence MSK- + joint pain, muscle aches, injury Neuro- denies headache, dizziness, syncope, seizure activity       Objective:    BP 132/78   Pulse 76   Temp 98.2 F (36.8 C) (Temporal)   Resp 14   Ht 5\' 2"  (1.575 m)   Wt 193 lb (87.5 kg)   SpO2 98%   BMI 35.30 kg/m  GEN- NAD, alert and oriented x3 HEENT- PERRL, EOMI, non injected sclera, pink conjunctiva, MMM, oropharynx clear Neck- Supple, no thyromegaly CVS- RRR, no murmur RESP-CTAB ABD-NABS,soft,NT,ND Psych- normal affect and mood  msk- Spine NT, fair ROM, neg SLR, right handmild TTP at base of thumb, neg finklesteins, normal grasp, able to make fist  Neuro- sensation in tact Upper/lower Ext, strength in tact bilat LE  EXT- No edema Pulses- Radial, DP-  2+     FALL/CAGE/Depression screen negative     Assessment & Plan:      Problem List Items Addressed This Visit      Unprioritized   Obesity    Pt working with dietician  Discussed starting with some type of physical activity a few times a week  I think this willhelp back pain as well  No red flags on exam for back, hold on imaging, no current OTC meds, stretching, heating pad can be used  For hand, likley has some OA , chronic pain, discussed xray pt prefers to hold for now, will let me know if she wants to proceed. She can use topical NSAID into joint        Other Visit Diagnoses    Routine general medical examination at a health care facility    -  Primary   CPE done, fasting labs obtained, pt to schedule mammogram   Relevant Orders   CBC with Differential/Platelet (Completed)   Comprehensive metabolic panel (Completed)   Lipid panel (Completed)   Glucose intolerance       Relevant Orders   Hemoglobin A1c (Completed)   Perimenopausal       Schedule with GYN, will check TSH and LH/FSH   Relevant Orders   TSH (Completed)   FSH/LH (Completed)  Chronic pain of right thumb       Encounter for screening mammogram for malignant neoplasm of breast       Relevant Orders   MM 3D SCREEN BREAST BILATERAL   Acute midline low back pain without sciatica       Need for hepatitis C screening test          Note: This dictation was prepared with Dragon dictation along with smaller phrase technology. Any transcriptional errors that result from this process are unintentional.

## 2019-07-30 ENCOUNTER — Encounter: Payer: Self-pay | Admitting: Family Medicine

## 2019-07-30 LAB — CBC WITH DIFFERENTIAL/PLATELET
Absolute Monocytes: 577 cells/uL (ref 200–950)
Basophils Absolute: 93 cells/uL (ref 0–200)
Basophils Relative: 1 %
Eosinophils Absolute: 177 cells/uL (ref 15–500)
Eosinophils Relative: 1.9 %
HCT: 41.6 % (ref 35.0–45.0)
Hemoglobin: 14.1 g/dL (ref 11.7–15.5)
Lymphs Abs: 3032 cells/uL (ref 850–3900)
MCH: 29 pg (ref 27.0–33.0)
MCHC: 33.9 g/dL (ref 32.0–36.0)
MCV: 85.4 fL (ref 80.0–100.0)
MPV: 10.6 fL (ref 7.5–12.5)
Monocytes Relative: 6.2 %
Neutro Abs: 5422 cells/uL (ref 1500–7800)
Neutrophils Relative %: 58.3 %
Platelets: 306 10*3/uL (ref 140–400)
RBC: 4.87 10*6/uL (ref 3.80–5.10)
RDW: 13.3 % (ref 11.0–15.0)
Total Lymphocyte: 32.6 %
WBC: 9.3 10*3/uL (ref 3.8–10.8)

## 2019-07-30 LAB — COMPREHENSIVE METABOLIC PANEL
AG Ratio: 1.3 (calc) (ref 1.0–2.5)
ALT: 12 U/L (ref 6–29)
AST: 13 U/L (ref 10–35)
Albumin: 4.1 g/dL (ref 3.6–5.1)
Alkaline phosphatase (APISO): 86 U/L (ref 37–153)
BUN: 11 mg/dL (ref 7–25)
CO2: 27 mmol/L (ref 20–32)
Calcium: 9.2 mg/dL (ref 8.6–10.4)
Chloride: 106 mmol/L (ref 98–110)
Creat: 0.66 mg/dL (ref 0.50–1.05)
Globulin: 3.1 g/dL (calc) (ref 1.9–3.7)
Glucose, Bld: 158 mg/dL — ABNORMAL HIGH (ref 65–99)
Potassium: 4.1 mmol/L (ref 3.5–5.3)
Sodium: 142 mmol/L (ref 135–146)
Total Bilirubin: 0.4 mg/dL (ref 0.2–1.2)
Total Protein: 7.2 g/dL (ref 6.1–8.1)

## 2019-07-30 LAB — HEMOGLOBIN A1C
Hgb A1c MFr Bld: 5.5 % of total Hgb (ref <5.7)
Mean Plasma Glucose: 111 (calc)
eAG (mmol/L): 6.2 (calc)

## 2019-07-30 LAB — LIPID PANEL
Cholesterol: 179 mg/dL (ref <200)
HDL: 42 mg/dL — ABNORMAL LOW (ref >=50)
LDL Cholesterol (Calc): 104 mg/dL (calc) — ABNORMAL HIGH
Non-HDL Cholesterol (Calc): 137 mg/dL (calc) — ABNORMAL HIGH (ref <130)
Total CHOL/HDL Ratio: 4.3 (calc) (ref <5.0)
Triglycerides: 221 mg/dL — ABNORMAL HIGH (ref <150)

## 2019-07-30 LAB — FSH/LH
FSH: 24.1 m[IU]/mL
LH: 15.5 m[IU]/mL

## 2019-07-30 LAB — TSH: TSH: 1.75 mIU/L

## 2019-07-30 NOTE — Assessment & Plan Note (Signed)
Pt working with dietician  Discussed starting with some type of physical activity a few times a week  I think this willhelp back pain as well  No red flags on exam for back, hold on imaging, no current OTC meds, stretching, heating pad can be used  For hand, likley has some OA , chronic pain, discussed xray pt prefers to hold for now, will let me know if she wants to proceed. She can use topical NSAID into joint

## 2019-08-08 IMAGING — US US BREAST BX W LOC DEV 1ST LESION IMG BX SPEC US GUIDE*L*
1 series · 13 of 18 positions shown · non-contrast
Comparison: 11/11/2016, 11/15/2016, and earlier

ADDENDUM:
Pathology of the breast biopsy revealed a FIBROADENOMA. NO
MALIGNANCY IDENTIFIED.

This was found to be concordant by Dr. Decor.
Recommendation:  Six month follow-up ultrasound of the left breast.
At the patient's request, results and recommendations were relayed
to the patient by phone by Bha, Ben Soltana on 12/15/16. The
patient stated she has done well following the biopsy with no
bleeding, bruising or hematoma. Post biopsy instructions were
reviewed with the patient. All of her questions were answered. She
was encouraged to contact the imaging department of [HOSPITAL]
Addendum by Bha, Ben Soltana on 12/15/16.
CLINICAL DATA: Patient presents for ultrasound-guided core biopsy
of mass in the left breast 1 o'clock location.
EXAM:
ULTRASOUND GUIDED LEFT BREAST CORE NEEDLE BIOPSY

[Series 1: us breast bx w loc dev 1st lesion img bx spec us g · 0.07mm/px · 13 of 18 slices shown]
[im 1/18]
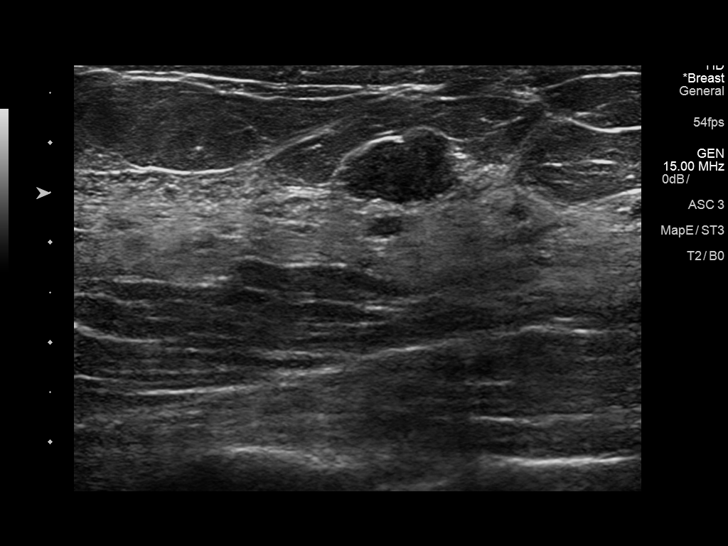
[im 3/18]
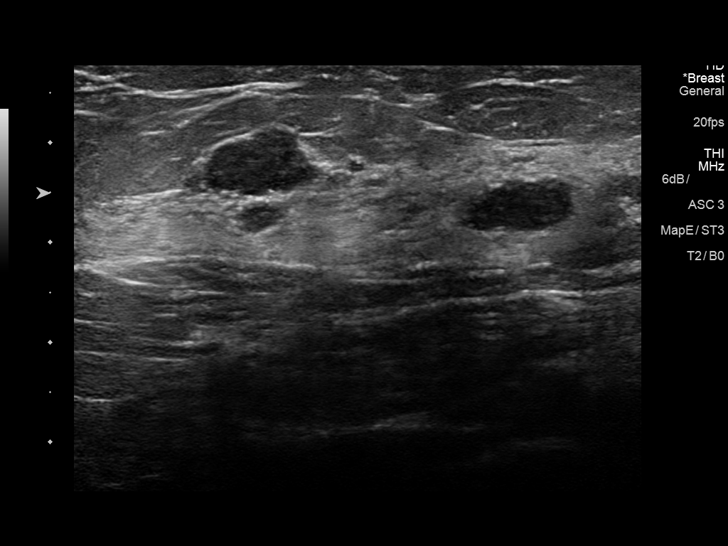
[im 4/18]
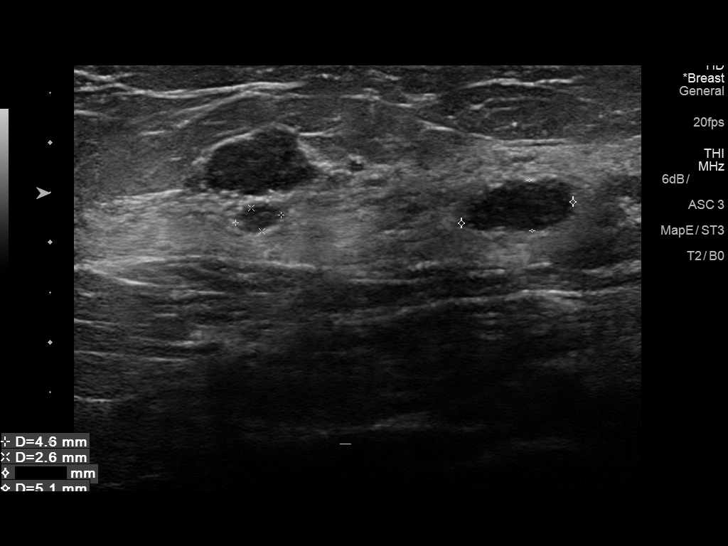
[im 5/18]
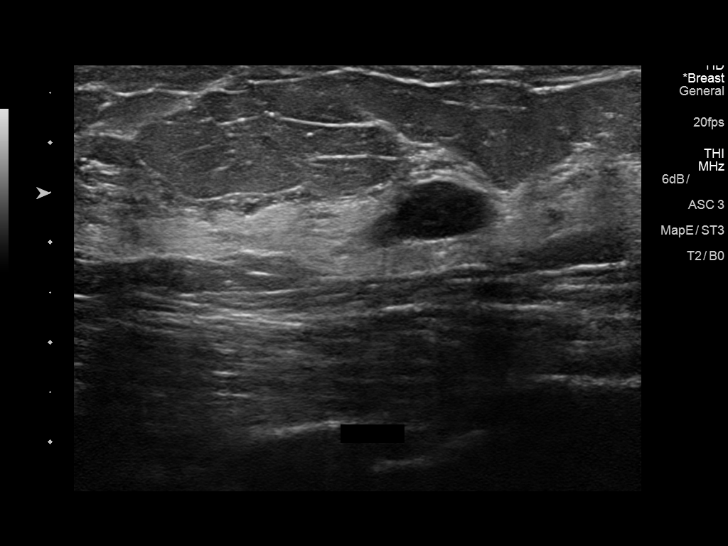
[im 7/18]
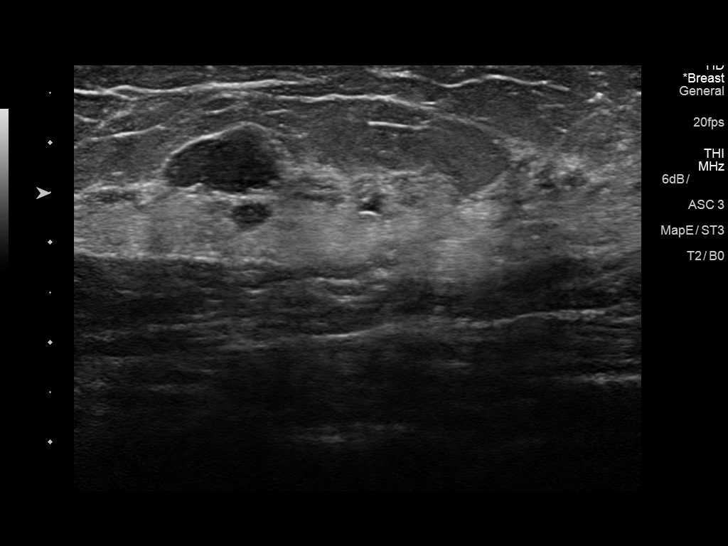
[im 8/18]
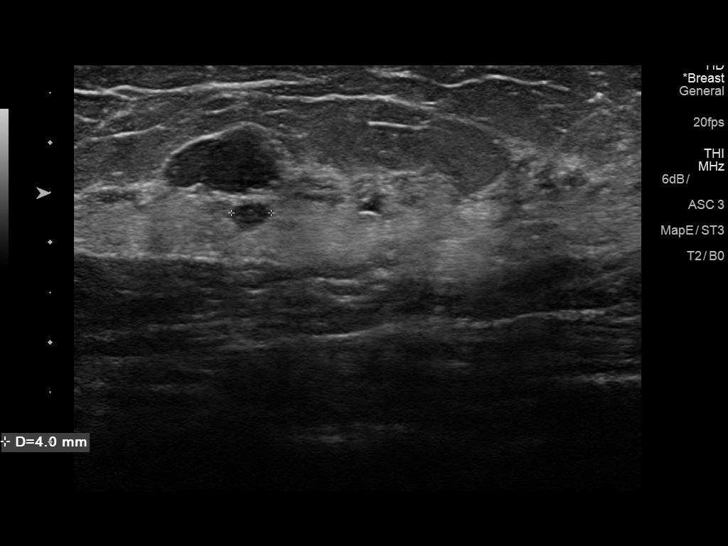
[im 10/18]
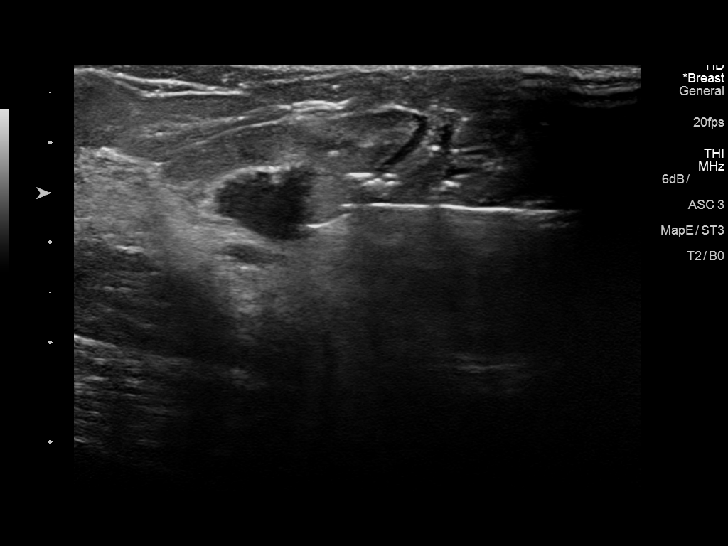
[im 11/18]
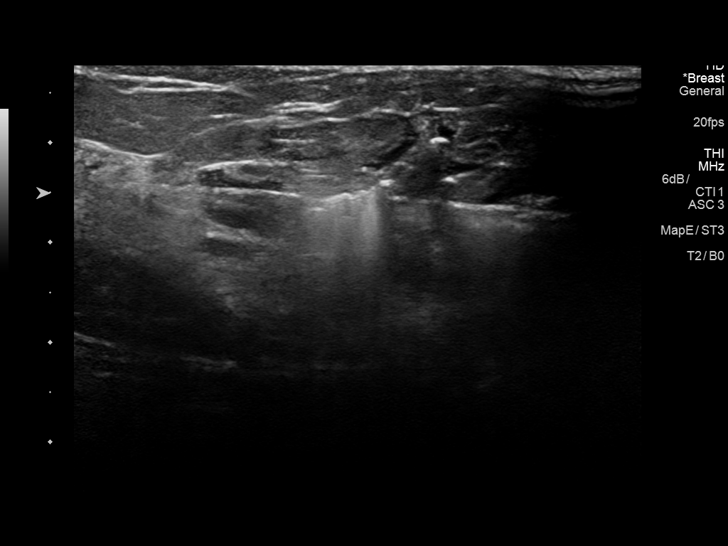
[im 12/18]
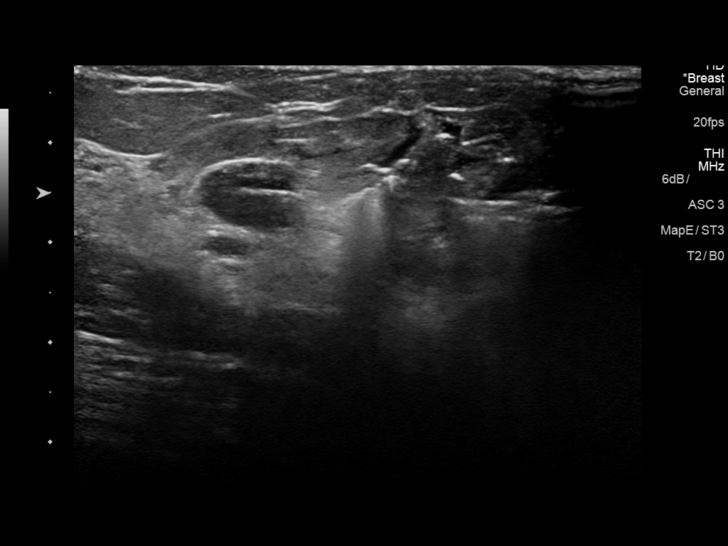
[im 14/18]
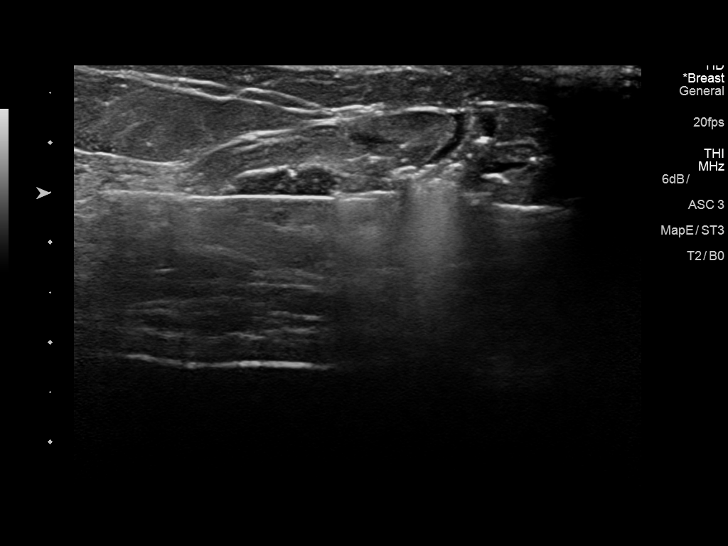
[im 15/18]
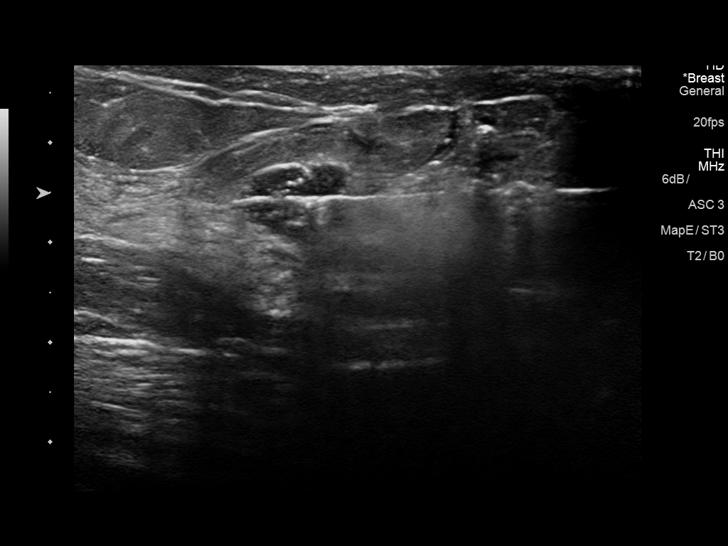
[im 16/18]
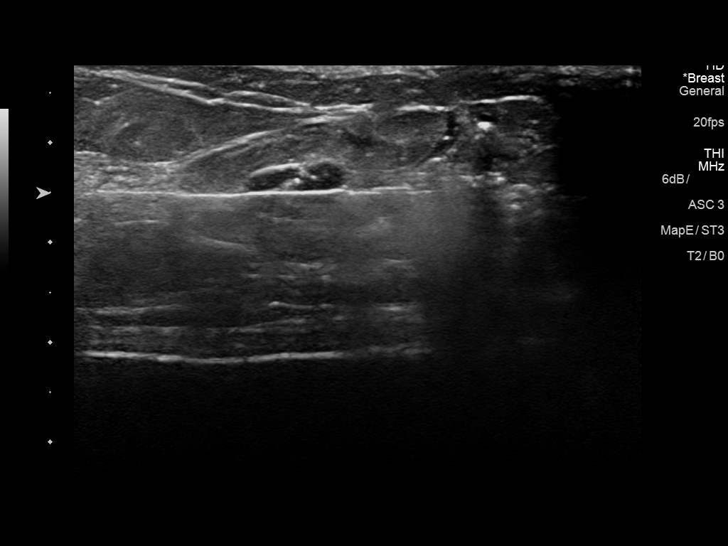
[im 18/18]
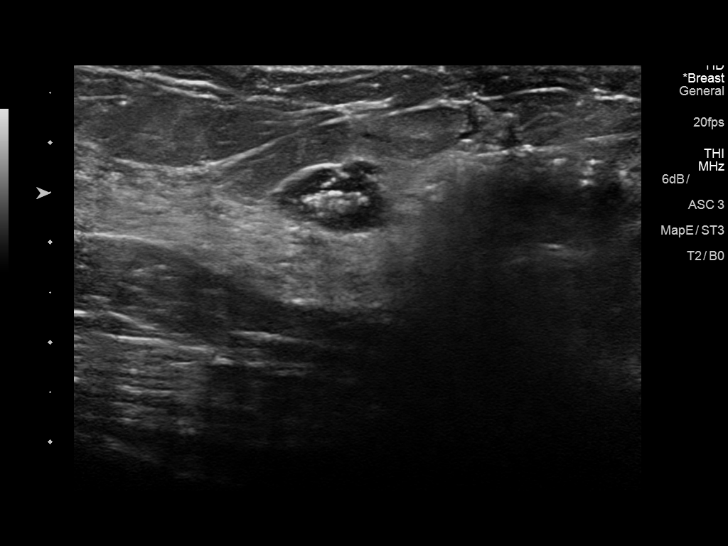

[13 of 18 positions shown; findings below may reference images not displayed]

FINDINGS: Prior to the biopsy ultrasound confirms presence of an oval
hypoechoic parallel mass in the 1 o'clock location of the left
breast 3 cm from the nipple. Additionally, 2 circumscribed masses
with similar features are identified in the same portion of the
breast, measuring 0.5 x 0.3 x 0.4 cm and 1.1 x 0.5 x 1.2 cm. We
discussed the plan of follow-up of these 2 masses if the biopsy of
the larger mass is benign.

I met with the patient and we discussed the procedure of
ultrasound-guided biopsy, including benefits and alternatives. We
discussed the high likelihood of a successful procedure. We
discussed the risks of the procedure, including infection, bleeding,
tissue injury, clip migration, and inadequate sampling. Informed
written consent was given. The usual time-out protocol was performed
immediately prior to the procedure.

Lesion quadrant: Upper-outer quadrant left breast

Using sterile technique and 1% Lidocaine as local anesthetic, under
direct ultrasound visualization, a 12 gauge Jag device was
used to perform biopsy of more superficial and larger of the 3
masses in the 1 o'clock location of the left breast 3 cm from the
nipple using a lateral approach. At the conclusion of the procedure
a ribbon shaped tissue marker clip was deployed into the biopsy
cavity. Follow up 2 view mammogram was performed and dictated
separately.
IMPRESSION: Ultrasound guided biopsy of left breast mass. No apparent
complications.

Plan follow-up left breast ultrasound if biopsy is benign.

## 2019-08-14 ENCOUNTER — Encounter: Payer: Self-pay | Admitting: *Deleted

## 2019-08-20 ENCOUNTER — Encounter: Payer: 59 | Attending: Family Medicine | Admitting: Nutrition

## 2019-08-20 ENCOUNTER — Encounter: Payer: Self-pay | Admitting: Nutrition

## 2019-08-20 ENCOUNTER — Other Ambulatory Visit: Payer: Self-pay

## 2019-08-20 VITALS — Ht 62.0 in | Wt 193.0 lb

## 2019-08-20 DIAGNOSIS — F418 Other specified anxiety disorders: Secondary | ICD-10-CM | POA: Insufficient documentation

## 2019-08-20 DIAGNOSIS — I10 Essential (primary) hypertension: Secondary | ICD-10-CM | POA: Insufficient documentation

## 2019-08-20 DIAGNOSIS — Z713 Dietary counseling and surveillance: Secondary | ICD-10-CM | POA: Insufficient documentation

## 2019-08-20 DIAGNOSIS — Z6835 Body mass index (BMI) 35.0-35.9, adult: Secondary | ICD-10-CM | POA: Insufficient documentation

## 2019-08-20 NOTE — Patient Instructions (Signed)
Goal  Lose 3 lbs per month. Decrease sweets and salty foods Drink water.64-80 oz a day Exercise 30 three times per week

## 2019-08-20 NOTE — Progress Notes (Signed)
  Medical Nutrition Therapy:  Appt start time: 1400 end time:  1430.  Employee ID [id number] Second visit  Assessment:  Primary concerns today: Second visit:  Overweight,   Saw Dr. Buelah Manis. Has been using [email or web address].  Gained 4 lbs.  Has been working on eating more vegetabls soup. Has been eating more lower carb veggies. Has had some family issues these last few week and feels like she binge ate and gained some weight back. Feels stressed and anxiety of her family issues.  Husband sometimes offers food that she wants to avoid to help lose weight.  Willing to work on increasing exercise and getting back on track with food choices and meal planning.  Lab Results  Component Value Date   HGBA1C 5.5 07/29/2019   CMP Latest Ref Rng & Units 07/29/2019 08/24/2018 07/10/2017  Glucose 65 - 99 mg/dL 158(H) 134(H) 141(H)  BUN 7 - 25 mg/dL 11 10 12   Creatinine 0.50 - 1.05 mg/dL 0.66 0.65 0.69  Sodium 135 - 146 mmol/L 142 140 139  Potassium 3.5 - 5.3 mmol/L 4.1 4.0 4.3  Chloride 98 - 110 mmol/L 106 106 107  CO2 20 - 32 mmol/L 27 23 24   Calcium 8.6 - 10.4 mg/dL 9.2 9.1 9.3  Total Protein 6.1 - 8.1 g/dL 7.2 7.3 7.3  Total Bilirubin 0.2 - 1.2 mg/dL 0.4 0.5 0.5  Alkaline Phos 33 - 130 U/L - - -  AST 10 - 35 U/L 13 16 13   ALT 6 - 29 U/L 12 15 12    Lipid Panel     Component Value Date/Time   CHOL 179 07/29/2019 1533   TRIG 221 (H) 07/29/2019 1533   HDL 42 (L) 07/29/2019 1533   CHOLHDL 4.3 07/29/2019 1533   VLDL 19 02/27/2016 0846   LDLCALC 104 (H) 07/29/2019 1533    Preferred Learning Style:  No preference indicated   Learning Readiness:   Ready  Change in progress   MEDICATIONS:   DIETARY INTAKE:   24-hr recall:  B ( AM): 3 slices bacon, 2 boiled eggs with flavored water "ICE " Snk ( AM): walnuts  L ( PM): smoked oysters and saltines    Snk ( PM):  D ( PM): Side salad and shrimp and water Snk ( PM):  Beverages: water  Usual physical activity: walk some   Estimated energy  needs: 1200 calories 135 g carbohydrates 90 g protein 33 g fat  Progress Towards Goal(s):  In progress.   Nutritional Diagnosis:  [place]-3.3 Overweight/obesity As related to inconsistent macronutrients.  As evidenced by BMI 34 and PreDm with A1C 5.9%.    Intervention:  Nutrition and Pre Diabetes education provided on My Plate, CHO counting, meal planning, portion sizes, timing of meals, avoiding snacks between meals, target ranges for A1C and blood sugars, signs/symptoms and treatment of hyper/hypoglycemia,  benefits of exercising 60 minutes times per week and prevention of DM. Weight loss tips.   Goal  Lose 3 lbs per month. Decrease sweets and salty foods Drink water.64-80 oz a day Exercise 30 three times per week  Teaching Method Utilized:  Visual Auditory Hands on  Handouts given during visit include:  The Plate Method  Weight loss tips   Mindful eating.   Barriers to learning/adherence to lifestyle change:   Demonstrated degree of understanding via:  Teach Back   Monitoring/Evaluation:  Dietary intake, exercise, , and body weight in 1 month(s).

## 2019-09-17 ENCOUNTER — Encounter: Payer: 59 | Attending: Family Medicine | Admitting: Nutrition

## 2019-09-17 ENCOUNTER — Other Ambulatory Visit: Payer: Self-pay

## 2019-09-17 VITALS — Ht 62.0 in | Wt 196.6 lb

## 2019-09-17 DIAGNOSIS — E1165 Type 2 diabetes mellitus with hyperglycemia: Secondary | ICD-10-CM | POA: Insufficient documentation

## 2019-09-17 DIAGNOSIS — Z6835 Body mass index (BMI) 35.0-35.9, adult: Secondary | ICD-10-CM | POA: Insufficient documentation

## 2019-09-17 DIAGNOSIS — E7800 Pure hypercholesterolemia, unspecified: Secondary | ICD-10-CM | POA: Insufficient documentation

## 2019-09-17 DIAGNOSIS — E669 Obesity, unspecified: Secondary | ICD-10-CM | POA: Insufficient documentation

## 2019-09-17 DIAGNOSIS — E782 Mixed hyperlipidemia: Secondary | ICD-10-CM | POA: Insufficient documentation

## 2019-09-17 DIAGNOSIS — E118 Type 2 diabetes mellitus with unspecified complications: Secondary | ICD-10-CM | POA: Insufficient documentation

## 2019-09-17 DIAGNOSIS — IMO0002 Reserved for concepts with insufficient information to code with codable children: Secondary | ICD-10-CM

## 2019-09-17 NOTE — Progress Notes (Signed)
Medical Nutrition Therapy:  Appt start time: 1300 end time:  1315  Employee ID [id number] Third Visit  Assessment:  Primary concerns today: Second visit:  Overweight,   Saw Dr. Buelah Manis. Has been using [email or web address].   Has gotten a fitbit and trying to be more active. Most days are [id number]  [email or web address] -logs her food in and sometimes doesn't eat enough and then binges at other times.  Changes: Trying to reduce her stress and work on emotional eating.. A1C 5.5% . down from 6.9%. No medications. Gained 3 lbs since last visit. Husband not been supportive due to  buying more sweets. Admits she needs to be more consistent and work on emotional eating.   Lab Results  Component Value Date   HGBA1C 5.5 07/29/2019   CMP Latest Ref Rng & Units 07/29/2019 08/24/2018 07/10/2017  Glucose 65 - 99 mg/dL 158(H) 134(H) 141(H)  BUN 7 - 25 mg/dL 11 10 12   Creatinine 0.50 - 1.05 mg/dL 0.66 0.65 0.69  Sodium 135 - 146 mmol/L 142 140 139  Potassium 3.5 - 5.3 mmol/L 4.1 4.0 4.3  Chloride 98 - 110 mmol/L 106 106 107  CO2 20 - 32 mmol/L 27 23 24   Calcium 8.6 - 10.4 mg/dL 9.2 9.1 9.3  Total Protein 6.1 - 8.1 g/dL 7.2 7.3 7.3  Total Bilirubin 0.2 - 1.2 mg/dL 0.4 0.5 0.5  Alkaline Phos 33 - 130 U/L - - -  AST 10 - 35 U/L 13 16 13   ALT 6 - 29 U/L 12 15 12    Lipid Panel     Component Value Date/Time   CHOL 179 07/29/2019 1533   TRIG 221 (H) 07/29/2019 1533   HDL 42 (L) 07/29/2019 1533   CHOLHDL 4.3 07/29/2019 1533   VLDL 19 02/27/2016 0846   LDLCALC 104 (H) 07/29/2019 1533    Preferred Learning Style:  No preference indicated   Learning Readiness:   Ready  Change in progress   MEDICATIONS:   DIETARY INTAKE:   24-hr recall:  B ( AM): 3 slices bacon, 2 boiled eggs with flavored water "ICE " Snk ( AM): walnuts  L ( PM): smoked oysters and saltines    Snk ( PM):  D ( PM): Side salad and shrimp and water Snk ( PM):  Beverages: water  Usual physical activity: walk some   Estimated  energy needs: 1200 calories 135 g carbohydrates 90 g protein 33 g fat  Progress Towards Goal(s):  In progress.   Nutritional Diagnosis:  [place]-3.3 Overweight/obesity As related to inconsistent macronutrients.  As evidenced by BMI 34 and PreDm with A1C 5.9%.    Intervention:  Nutrition and Pre Diabetes education provided on My Plate, CHO counting, meal planning, portion sizes, timing of meals, avoiding snacks between meals, target ranges for A1C and blood sugars, signs/symptoms and treatment of hyper/hypoglycemia,  benefits of exercising 60 minutes times per week and prevention of DM. Weight loss tips.   Goal  Lose 3 lbs per month. Decrease sweets and salty foods- has been working on that. Drink water.64-80 oz a day--still working on it. Exercise 30 three times per week - Increase high fiber foods due to hyperlipidemia   Teaching Method Utilized:  Visual Auditory Hands on  Handouts given during visit include:  The Plate Method  Weight loss tips   Mindful eating.   Barriers to learning/adherence to lifestyle change:   Demonstrated degree of understanding via:  Teach Back   Monitoring/Evaluation:  Dietary intake, exercise, , and body weight PRN .

## 2019-09-17 NOTE — Patient Instructions (Signed)
Goals  Get back to eating breakfast  Keep increasing water Increase exericise Don't skip meals Track foods on my [email or web address] Lose 1-2 lbs per week Goal wt:: 180 lbs

## 2019-09-19 ENCOUNTER — Encounter: Payer: Self-pay | Admitting: Nutrition

## 2019-10-10 ENCOUNTER — Other Ambulatory Visit: Payer: Self-pay

## 2019-10-10 ENCOUNTER — Encounter: Payer: Self-pay | Admitting: Orthopedic Surgery

## 2019-10-10 ENCOUNTER — Ambulatory Visit: Payer: 59

## 2019-10-10 ENCOUNTER — Ambulatory Visit (INDEPENDENT_AMBULATORY_CARE_PROVIDER_SITE_OTHER): Payer: 59 | Admitting: Orthopedic Surgery

## 2019-10-10 VITALS — BP 142/85 | HR 83 | Ht 62.0 in | Wt 193.0 lb

## 2019-10-10 DIAGNOSIS — M19041 Primary osteoarthritis, right hand: Secondary | ICD-10-CM

## 2019-10-10 DIAGNOSIS — G8929 Other chronic pain: Secondary | ICD-10-CM

## 2019-10-10 DIAGNOSIS — M19049 Primary osteoarthritis, unspecified hand: Secondary | ICD-10-CM

## 2019-10-10 DIAGNOSIS — M79644 Pain in right finger(s): Secondary | ICD-10-CM

## 2019-10-10 MED ORDER — MELOXICAM 7.5 MG PO TABS: 7.5000 mg | ORAL_TABLET | Freq: Every day | ORAL | 5 refills | Status: DC

## 2019-10-10 NOTE — Progress Notes (Signed)
NEW PROBLEM//OFFICE VISIT  Chief Complaint  Patient presents with   Hand Pain    base of right thumb is painful greater than 6 months     HPI 54 year old female presents with 52-month history of worsening right thumb pain associated with difficulty holding onto small objects and she is dropping things.  Denies numbness or tingling ROS Seems to have a negative review of systems no major medical problems  Past Medical History:  Diagnosis Date   Chronic gastritis    Complication of anesthesia    Depression    Duodenitis 08/2005   Dysphagia    Esophageal dilatation     Past Surgical History:  Procedure Laterality Date   BREAST BIOPSY Right 04/26/2013   Procedure: BREAST BIOPSY TIMES TWO;  Surgeon: Jamesetta So, MD;  Location: [facility];  Service: General;  Laterality: Right;   BREAST MASS EXCISION     bilateral tumor removal   COLONOSCOPY N/A 01/27/2016   Procedure: COLONOSCOPY;  Surgeon: Danie Binder, MD;  Location: [facility];  Service: Endoscopy;  Laterality: N/A;  [place] N/A 10/11/2013   Procedure: DILATATION & CURETTAGE/HYSTEROSCOPY WITH THERMACHOICE ABLATION;  Surgeon: Florian Buff, MD;  Location: [facility];  Service: Gynecology;  Laterality: N/A;  total therapy time- 20 minutes temp  76 deg C   ESOPHAGEAL DILATION  08/2005   ESOPHAGOGASTRODUODENOSCOPY N/A 01/27/2016   Procedure: ESOPHAGOGASTRODUODENOSCOPY (EGD);  Surgeon: Danie Binder, MD;  Location: [facility];  Service: Endoscopy;  Laterality: N/A;   SAVORY DILATION N/A 01/27/2016   Procedure: SAVORY DILATION;  Surgeon: Danie Binder, MD;  Location: [facility];  Service: Endoscopy;  Laterality: N/A;   TUBAL LIGATION     WISDOM TOOTH EXTRACTION      Family History  Problem Relation Age of Onset   Hypertension Father    Stroke Father    Heart disease Father    Heart disease Mother    Cancer Paternal Aunt        colon    Cancer Paternal Uncle        colon   Cancer Cousin        colon   Social History   Tobacco Use   Smoking status: Never Smoker   Smokeless tobacco: Never Used  Substance Use Topics   Alcohol use: No   Drug use: No    Allergies  Allergen Reactions   Lisinopril Cough    No outpatient medications have been marked as taking for the 10/10/19 encounter (Office Visit) with Carole Civil, MD.    BP (!) 142/85    Pulse 83    Ht 5\' 2"  (1.575 m)    Wt 193 lb (87.5 kg)    BMI 35.30 kg/m   Physical Exam Constitutional:      General: She is not in acute distress.    Appearance: She is well-developed.  Cardiovascular:     Comments: No peripheral edema Skin:    General: Skin is warm and dry.  Neurological:     Mental Status: She is alert and oriented to person, place, and time.     Sensory: No sensory deficit.     Coordination: Coordination normal.     Gait: Gait normal.     Deep Tendon Reflexes: Reflexes are normal and symmetric.     Ortho Exam  Left thumb no tenderness or deformity although there is some prominence at the base of the thumb no  swelling normal range of motion normal extension and flexion IP joints and normal strength color capillary refill normal  Right thumb is tender there is crepitance a positive grind test at the [facility] joint deformity.  Pinch is weak color capillary refill normal flexion extension tendons IP joint normal  MEDICAL DECISION MAKING  A.  Encounter Diagnoses  Name Primary?   Chronic pain of right thumb    CMC arthritis Yes    B. DATA ANALYSED:   IMAGING: Interpretation of images: Internal images arthritis of the [facility] joint Orders: na  Outside records reviewed: na   C. MANAGEMENT   CMC joint injection  Procedure note injection right thumb  Injection right CMC joint Medication Depo-Medrol 40 mg and lidocaine 1% 2 cc The patient gave verbal consent Timeout confirmed the site of injection right CMC joint  Alcohol and  ethyl chloride was used to repair the skin and then a 25-gauge needle was used to inject the [facility] joint of the right thumb  There were no complications a sterile Band-Aid was applied  Splinting  Meds ordered this encounter  Medications   meloxicam (MOBIC) 7.5 MG tablet    Sig: Take 1 tablet (7.5 mg total) by mouth daily.    Dispense:  30 tablet    Refill:  5    8-week follow-up  Arther Abbott, MD  10/10/2019 4:23 PM

## 2019-10-10 NOTE — Patient Instructions (Signed)
You have received an injection of steroids into the joint. 15% of patients will have increased pain within the 24 hours postinjection.   This is transient and will go away.   We recommend that you use ice packs on the injection site for 20 minutes every 2 hours and extra strength Tylenol 2 tablets every 8 as needed until the pain resolves.  If you continue to have pain after taking the Tylenol and using the ice please call the office for further instructions.  START NSAID MELOXICAM X 6 WEEKS

## 2019-10-14 ENCOUNTER — Ambulatory Visit: Payer: 59 | Admitting: Orthopedic Surgery

## 2019-11-07 ENCOUNTER — Encounter: Payer: Self-pay | Admitting: Family Medicine

## 2019-11-07 DIAGNOSIS — D242 Benign neoplasm of left breast: Secondary | ICD-10-CM

## 2019-11-11 ENCOUNTER — Other Ambulatory Visit: Payer: Self-pay | Admitting: Family Medicine

## 2019-11-11 DIAGNOSIS — D242 Benign neoplasm of left breast: Secondary | ICD-10-CM

## 2019-12-12 ENCOUNTER — Encounter: Payer: Self-pay | Admitting: Orthopedic Surgery

## 2019-12-12 ENCOUNTER — Other Ambulatory Visit: Payer: Self-pay

## 2019-12-12 ENCOUNTER — Ambulatory Visit: Payer: 59 | Admitting: Orthopedic Surgery

## 2019-12-12 VITALS — BP 155/109 | HR 97

## 2019-12-12 DIAGNOSIS — M19041 Primary osteoarthritis, right hand: Secondary | ICD-10-CM | POA: Diagnosis not present

## 2019-12-12 DIAGNOSIS — M19049 Primary osteoarthritis, unspecified hand: Secondary | ICD-10-CM

## 2019-12-12 NOTE — Patient Instructions (Signed)
Call us for injections when needed

## 2019-12-12 NOTE — Progress Notes (Signed)
Chief Complaint  Patient presents with  . Wrist Pain    CMC arthritis right wrist, some better, worse when using her hands more, brace helps at home   54 year old female with CMC arthritis right hand.  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  She says that the bracing helps her she is not requiring daily anti-inflammatories but she does have good days and bad days depending on positioning and amount of work she is doing up at the front desk  On examination her right thumb nontender to palpation over the [facility] joint but we can reproduce pain with the "grind test"  She can good good pinch and opposition does have some difficulty opposing to the small digit no instability is detected  Recommend she continue to ice as needed brace as needed anti-inflammatories as needed and she can call for injections when that is not controlling the pain

## 2019-12-17 ENCOUNTER — Ambulatory Visit (HOSPITAL_COMMUNITY)
Admission: RE | Admit: 2019-12-17 | Discharge: 2019-12-17 | Disposition: A | Discharge status: Home / Self Care | Payer: 59 | Source: Ambulatory Visit | Attending: Family Medicine | Admitting: Family Medicine

## 2019-12-17 ENCOUNTER — Other Ambulatory Visit: Payer: Self-pay

## 2019-12-17 DIAGNOSIS — Z1231 Encounter for screening mammogram for malignant neoplasm of breast: Secondary | ICD-10-CM | POA: Diagnosis not present

## 2019-12-17 DIAGNOSIS — D242 Benign neoplasm of left breast: Secondary | ICD-10-CM

## 2019-12-17 DIAGNOSIS — R922 Inconclusive mammogram: Secondary | ICD-10-CM | POA: Diagnosis not present

## 2020-03-24 ENCOUNTER — Encounter: Payer: Self-pay | Admitting: Family Medicine

## 2020-05-22 ENCOUNTER — Other Ambulatory Visit: Payer: 59 | Admitting: Adult Health

## 2020-07-21 ENCOUNTER — Telehealth: Payer: Self-pay

## 2020-07-21 DIAGNOSIS — K219 Gastro-esophageal reflux disease without esophagitis: Secondary | ICD-10-CM

## 2020-07-21 DIAGNOSIS — R1310 Dysphagia, unspecified: Secondary | ICD-10-CM

## 2020-07-21 NOTE — Telephone Encounter (Signed)
Dr. Gala Romney advised to schedule EGD/possible Dilation w/Propofol ASA 2 for 07/24/20 at 12:30pm for GERD and dysphagia. Orders entered. Pt advised to go to lab 07/22/20 for urine pregnancy test (she has a period approx 3 times/year). Instructions and lab letter given to pt.

## 2020-07-22 ENCOUNTER — Other Ambulatory Visit: Payer: Self-pay

## 2020-07-22 ENCOUNTER — Other Ambulatory Visit (HOSPITAL_COMMUNITY)
Admission: RE | Admit: 2020-07-22 | Discharge: 2020-07-22 | Disposition: A | Discharge status: Home / Self Care | Payer: 59 | Source: Ambulatory Visit | Attending: Internal Medicine | Admitting: Internal Medicine

## 2020-07-22 DIAGNOSIS — K219 Gastro-esophageal reflux disease without esophagitis: Secondary | ICD-10-CM | POA: Diagnosis not present

## 2020-07-22 DIAGNOSIS — R1310 Dysphagia, unspecified: Secondary | ICD-10-CM | POA: Insufficient documentation

## 2020-07-22 LAB — PREGNANCY, URINE: Preg Test, Ur: NEGATIVE

## 2020-07-24 ENCOUNTER — Ambulatory Visit (HOSPITAL_COMMUNITY): Payer: 59 | Admitting: Anesthesiology

## 2020-07-24 ENCOUNTER — Ambulatory Visit (HOSPITAL_COMMUNITY)
Admission: RE | Admit: 2020-07-24 | Discharge: 2020-07-24 | Disposition: A | Discharge status: Home / Self Care | Payer: 59 | Attending: Internal Medicine | Admitting: Internal Medicine

## 2020-07-24 ENCOUNTER — Other Ambulatory Visit: Payer: Self-pay

## 2020-07-24 ENCOUNTER — Encounter (HOSPITAL_COMMUNITY): Payer: Self-pay | Admitting: Internal Medicine

## 2020-07-24 ENCOUNTER — Encounter (HOSPITAL_COMMUNITY)
Admission: RE | Disposition: A | Discharge status: Home / Self Care | Payer: Self-pay | Source: Home / Self Care | Attending: Internal Medicine

## 2020-07-24 DIAGNOSIS — Z888 Allergy status to other drugs, medicaments and biological substances status: Secondary | ICD-10-CM | POA: Diagnosis not present

## 2020-07-24 DIAGNOSIS — K317 Polyp of stomach and duodenum: Secondary | ICD-10-CM | POA: Diagnosis not present

## 2020-07-24 DIAGNOSIS — K449 Diaphragmatic hernia without obstruction or gangrene: Secondary | ICD-10-CM | POA: Diagnosis not present

## 2020-07-24 DIAGNOSIS — Z791 Long term (current) use of non-steroidal anti-inflammatories (NSAID): Secondary | ICD-10-CM | POA: Insufficient documentation

## 2020-07-24 DIAGNOSIS — R1310 Dysphagia, unspecified: Secondary | ICD-10-CM | POA: Insufficient documentation

## 2020-07-24 DIAGNOSIS — K222 Esophageal obstruction: Secondary | ICD-10-CM | POA: Insufficient documentation

## 2020-07-24 DIAGNOSIS — F418 Other specified anxiety disorders: Secondary | ICD-10-CM | POA: Diagnosis not present

## 2020-07-24 HISTORY — PX: BIOPSY: SHX5522

## 2020-07-24 HISTORY — PX: POLYPECTOMY: SHX5525

## 2020-07-24 HISTORY — PX: ESOPHAGOGASTRODUODENOSCOPY (EGD) WITH PROPOFOL: SHX5813

## 2020-07-24 HISTORY — PX: MALONEY DILATION: SHX5535

## 2020-07-24 SURGERY — ESOPHAGOGASTRODUODENOSCOPY (EGD) WITH PROPOFOL
Anesthesia: General

## 2020-07-24 MED ORDER — LACTATED RINGERS IV SOLN
INTRAVENOUS | Status: DC
  Administered 2020-07-24: 1000 mL via INTRAVENOUS

## 2020-07-24 MED ORDER — STERILE WATER FOR IRRIGATION IR SOLN
Status: DC | PRN
  Administered 2020-07-24: 100 mL

## 2020-07-24 MED ORDER — PROPOFOL 10 MG/ML IV BOLUS
INTRAVENOUS | Status: DC | PRN
  Administered 2020-07-24: 100 mg via INTRAVENOUS
  Administered 2020-07-24: 40 mg via INTRAVENOUS
  Administered 2020-07-24 (×2): 50 mg via INTRAVENOUS

## 2020-07-24 MED ORDER — PROPOFOL 500 MG/50ML IV EMUL
INTRAVENOUS | Status: DC | PRN
  Administered 2020-07-24: 150 ug/kg/min via INTRAVENOUS

## 2020-07-24 MED ORDER — LIDOCAINE HCL (CARDIAC) PF 100 MG/5ML IV SOSY
PREFILLED_SYRINGE | INTRAVENOUS | Status: DC | PRN
  Administered 2020-07-24: 50 mg via INTRAVENOUS

## 2020-07-24 NOTE — Anesthesia Preprocedure Evaluation (Addendum)
Anesthesia Evaluation  Patient identified by MRN, date of birth, ID band Patient awake    Reviewed: Allergy & Precautions, NPO status , Patient's Chart, lab work & pertinent test results  History of Anesthesia Complications (+) PONV and history of anesthetic complications  Airway Mallampati: II  TM Distance: >3 FB Neck ROM: Full    Dental  (+) Dental Advisory Given [place] :   Pulmonary neg pulmonary ROS,    Pulmonary exam normal breath sounds clear to auscultation       Cardiovascular Exercise Tolerance: Good hypertension, Pt. on medications Normal cardiovascular exam Rhythm:Regular Rate:Normal     Neuro/Psych PSYCHIATRIC DISORDERS Anxiety Depression    GI/Hepatic Neg liver ROS, GERD  ,  Endo/Other  negative endocrine ROS  Renal/GU negative Renal ROS     Musculoskeletal negative musculoskeletal ROS (+)   Abdominal   Peds  Hematology negative hematology ROS (+)   Anesthesia Other Findings   Reproductive/Obstetrics negative OB ROS                            Anesthesia Physical Anesthesia Plan  ASA: 2  Anesthesia Plan: General   Post-op Pain Management:    Induction:   PONV Risk Score and Plan: Propofol infusion  Airway Management Planned: Nasal Cannula and Natural Airway  Additional Equipment:   Intra-op Plan:   Post-operative Plan:   Informed Consent: I have reviewed the patients History and Physical, chart, labs and discussed the procedure including the risks, benefits and alternatives for the proposed anesthesia with the patient or authorized representative who has indicated his/her understanding and acceptance.     Dental advisory given  Plan Discussed with: CRNA and Surgeon  Anesthesia Plan Comments:         Anesthesia Quick Evaluation

## 2020-07-24 NOTE — Discharge Instructions (Addendum)
EGD Discharge instructions Please read the instructions outlined below and refer to this sheet in the next few weeks. These discharge instructions provide you with general information on caring for yourself after you leave the hospital. Your doctor may also give you specific instructions. While your treatment has been planned according to the most current medical practices available, unavoidable complications occasionally occur. If you have any problems or questions after discharge, please call your doctor. ACTIVITY You may resume your regular activity but move at a slower pace for the next 24 hours.  Take frequent rest periods for the next 24 hours.  Walking will help expel (get rid of) the air and reduce the bloated feeling in your abdomen.  No driving for 24 hours (because of the anesthesia (medicine) used during the test).  You may shower.  Do not sign any important legal documents or operate any machinery for 24 hours (because of the anesthesia used during the test).  NUTRITION Drink plenty of fluids.  You may resume your normal diet.  Begin with a light meal and progress to your normal diet.  Avoid alcoholic beverages for 24 hours or as instructed by your caregiver.  MEDICATIONS You may resume your normal medications unless your caregiver tells you otherwise.  WHAT YOU CAN EXPECT TODAY You may experience abdominal discomfort such as a feeling of fullness or "gas" pains.  FOLLOW-UP Your doctor will discuss the results of your test with you.  SEEK IMMEDIATE MEDICAL ATTENTION IF ANY OF THE FOLLOWING OCCUR: Excessive nausea (feeling sick to your stomach) and/or vomiting.  Severe abdominal pain and distention (swelling).  Trouble swallowing.  Temperature over 101 F (37.8 C).  Rectal bleeding or vomiting of blood.    You had a Schatzki's ring and a small hiatal hernia.  Esophagus was dilated today.  I removed a gastric polyp.  Your duodenum was mildly inflamed.  Samples for H.  pylori taken.  GERD information provided  Begin Protonix 40 mg daily before breakfast  Further recommendations to follow pending review of pathology report  At patient request, I called Todd at [phone] -reviewed findings and recommendations  PATIENT INSTRUCTIONS POST-ANESTHESIA  IMMEDIATELY FOLLOWING SURGERY:  Do not drive or operate machinery for the first twenty four hours after surgery.  Do not make any important decisions for twenty four hours after surgery or while taking narcotic pain medications or sedatives.  If you develop intractable nausea and vomiting or a severe headache please notify your doctor immediately.  FOLLOW-UP:  Please make an appointment with your surgeon as instructed. You do not need to follow up with anesthesia unless specifically instructed to do so.  WOUND CARE INSTRUCTIONS (if applicable):  Keep a dry clean dressing on the anesthesia/puncture wound site if there is drainage.  Once the wound has quit draining you may leave it open to air.  Generally you should leave the bandage intact for twenty four hours unless there is drainage.  If the epidural site drains for more than 36-48 hours please call the anesthesia department.  QUESTIONS?:  Please feel free to call your physician or the hospital operator if you have any questions, and they will be happy to assist you.

## 2020-07-24 NOTE — Op Note (Signed)
[facility] Patient Name: [name] Procedure Date: 07/24/2020 11:37 AM MRN: [id number] Date of Birth: [birth year] Attending MD: Norvel Richards , MD CSN: [id number] Age: 55 Admit Type: Outpatient Procedure:                Upper GI endoscopy Indications:              Dysphagia Providers:                Norvel Richards, MD, Lambert Mody, Nelma Rothman, Technician Referring MD:              Medicines:                Propofol per Anesthesia Complications:            No immediate complications. Estimated Blood Loss:     Estimated blood loss was minimal. Procedure:                Pre-Anesthesia Assessment:                           - Prior to the procedure, a History and Physical                            was performed, and patient medications and                            allergies were reviewed. The patient's tolerance of                            previous anesthesia was also reviewed. The risks                            and benefits of the procedure and the sedation                            options and risks were discussed with the patient.                            All questions were answered, and informed consent                            was obtained. Prior Anticoagulants: The patient has                            taken no previous anticoagulant or antiplatelet                            agents. ASA Grade Assessment: II - A patient with                            mild systemic disease. After reviewing the risks  and benefits, the patient was deemed in                            satisfactory condition to undergo the procedure.                           After obtaining informed consent, the endoscope was                            passed under direct vision. Throughout the                            procedure, the patient's blood pressure, pulse, and                            oxygen saturations were  monitored continuously. The                            GIF-H190 ([id number]) was introduced through the                            mouth, and advanced to the second part of duodenum.                            The upper GI endoscopy was accomplished without                            difficulty. The patient tolerated the procedure                            well. Scope In: 11:57:36 AM Scope Out: 12:05:38 PM Total Procedure Duration: 0 hours 8 minutes 2 seconds  Findings:      A moderate Schatzki ring was found at the gastroesophageal junction. No       esophagitis. No tumor. No Barrett's epithelium. The scope was withdrawn.       Dilation was performed with a Maloney dilator with mild resistance at 47       Fr. The dilation site was examined following endoscope reinsertion and       showed complete resolution of luminal narrowing. Estimated blood loss       was minimal.      A small hiatal hernia was present. Scattered 1 to 3 mm gastric polyps.       No ulcer or infiltrating process. Pylorus patent.      The duodenal bulb and second portion of the duodenum were normal aside       from scattered bulbar erosions. Gastric polyp removed with cold biopsy       and gastric mucosal biopsies taken. Impression:               - Moderate Schatzki ring. Dilated.                           - Small hiatal hernia. Biopsied. Gastric polyp  removed. Gastric biopsies taken.                           -Bulbar erosions; otherwise normal duodenal bulb                            and second portion of the duodenum. Moderate Sedation:      Moderate (conscious) sedation was personally administered by an       anesthesia professional. The following parameters were monitored: oxygen       saturation, heart rate, blood pressure, and response to care. Recommendation:           - Patient has a contact number available for                            emergencies. The signs and symptoms of  potential                            delayed complications were discussed with the                            patient. Return to normal activities tomorrow.                            Written discharge instructions were provided to the                            patient.                           - Resume previous diet.                           - Continue present medications. Begin Protonix 40                            mg each morning before breakfast.                           - Return to my office (date not yet determined).                            Follow-up on pathology. Office visit to be                            scheduled. Procedure Code(s):        --- Professional ---                           [id number], Esophagogastroduodenoscopy, flexible,                            transoral; with biopsy, single or multiple                           43450, Dilation of esophagus, by unguided sound or  bougie, single or multiple passes Diagnosis Code(s):        --- Professional ---                           K22.2, Esophageal obstruction                           K44.9, Diaphragmatic hernia without obstruction or                            gangrene                           R13.10, Dysphagia, unspecified CPT copyright 2019 [employer]. All rights reserved. The codes documented in this report are preliminary and upon coder review may  be revised to meet current compliance requirements. Cristopher Estimable. Yamil Dougher, MD Norvel Richards, MD 07/24/2020 12:18:16 PM This report has been signed electronically. Number of Addenda: 0

## 2020-07-24 NOTE — Anesthesia Postprocedure Evaluation (Signed)
Anesthesia Post Note  Patient: [name]  Procedure(s) Performed: ESOPHAGOGASTRODUODENOSCOPY (EGD) WITH PROPOFOL MALONEY DILATION POLYPECTOMY BIOPSY  Anesthesia Type: General Level of consciousness: awake and alert and oriented Pain management: pain level controlled Vital Signs Assessment: post-procedure vital signs reviewed and stable Respiratory status: spontaneous breathing and respiratory function stable Cardiovascular status: blood pressure returned to baseline and stable Postop Assessment: no apparent nausea or vomiting Anesthetic complications: no   No notable events documented.   Last Vitals:  Vitals:   07/24/20 1028 07/24/20 1210  BP: (!) 150/94 (!) 126/59  Pulse: 86 90  Resp: 15   Temp: 37 C 36.9 C  SpO2: 97% 96%    Last Pain:  Vitals:   07/24/20 1210  TempSrc: Oral  PainSc:                  [name]

## 2020-07-24 NOTE — H&P (Signed)
@LOGO @   Primary Care Physician:  Eulogio Bear, NP Primary Gastroenterologist:  Dr. Gala Romney  Pre-Procedure History & Physical: HPI:  [name] is a 55 y.o. female here for further evaluation of esophageal dysphagia.  History of esophageal stricture dilated previously.  Patient has occasional reflux symptoms on no PPI.  Past Medical History:  Diagnosis Date   Chronic gastritis    Complication of anesthesia    Depression    Duodenitis 08/2005   Dysphagia    Esophageal dilatation     Past Surgical History:  Procedure Laterality Date   BREAST BIOPSY Right 04/26/2013   Procedure: BREAST BIOPSY TIMES TWO;  Surgeon: Jamesetta So, MD;  Location: [facility];  Service: General;  Laterality: Right;   BREAST MASS EXCISION     bilateral tumor removal   COLONOSCOPY N/A 01/27/2016   Procedure: COLONOSCOPY;  Surgeon: Danie Binder, MD;  Location: [facility];  Service: Endoscopy;  Laterality: N/A;  Grant N/A 10/11/2013   Procedure: DILATATION & CURETTAGE/HYSTEROSCOPY WITH THERMACHOICE ABLATION;  Surgeon: Florian Buff, MD;  Location: [facility];  Service: Gynecology;  Laterality: N/A;  total therapy time- 20 minutes temp  76 deg C   ESOPHAGEAL DILATION  08/2005   ESOPHAGOGASTRODUODENOSCOPY N/A 01/27/2016   Procedure: ESOPHAGOGASTRODUODENOSCOPY (EGD);  Surgeon: Danie Binder, MD;  Location: [facility];  Service: Endoscopy;  Laterality: N/A;   SAVORY DILATION N/A 01/27/2016   Procedure: SAVORY DILATION;  Surgeon: Danie Binder, MD;  Location: [facility];  Service: Endoscopy;  Laterality: N/A;   TUBAL LIGATION     WISDOM TOOTH EXTRACTION      Prior to Admission medications   Medication Sig Start Date End Date Taking? Authorizing Provider  meloxicam (MOBIC) 7.5 MG tablet Take 1 tablet (7.5 mg total) by mouth daily. 10/10/19   Carole Civil, MD    Allergies as of 07/21/2020 - Review Complete 12/12/2019  Allergen  Reaction Noted   Lisinopril Cough 04/24/2013    Family History  Problem Relation Age of Onset   Hypertension Father    Stroke Father    Heart disease Father    Heart disease Mother    Cancer Paternal Aunt        colon   Cancer Paternal Uncle        colon   Cancer Cousin        colon    Social History   Socioeconomic History   Marital status: Married    Spouse name: Not on file   Number of children: Not on file   Years of education: Not on file   Highest education level: Not on file  Occupational History   Occupation: Full time    Employer: [employer]  Tobacco Use   Smoking status: Never   Smokeless tobacco: Never  Vaping Use   Vaping Use: Never used  Substance and Sexual Activity   Alcohol use: No   Drug use: No   Sexual activity: Yes    Birth control/protection: Surgical  Other Topics Concern   Not on file  Social History Narrative   No regular exercise   Social Determinants of Health   Financial Resource Strain: Not on file  Food Insecurity: Not on file  Transportation Needs: Not on file  Physical Activity: Not on file  Stress: Not on file  Social Connections: Not on file  Intimate Partner Violence: Not on file    Review  of Systems: See HPI, otherwise negative ROS  Physical Exam: BP (!) 150/94   Pulse 86   Temp 98.6 F (37 C) (Oral)   Resp 15   Ht 5\' 3"  (1.6 m)   Wt 90.7 kg   SpO2 97%   BMI 35.43 kg/m  General:   Alert,  Well-developed, well-nourished, pleasant and cooperative in NAD Mouth:  No deformity or lesions. Neck:  Supple; no masses or thyromegaly. No significant cervical adenopathy. Lungs:  Clear throughout to auscultation.   No wheezes, crackles, or rhonchi. No acute distress. Heart:  Regular rate and rhythm; no murmurs, clicks, rubs,  or gallops. Abdomen: Non-distended, normal bowel sounds.  Soft and nontender without appreciable mass or hepatosplenomegaly.  Pulses:  Normal pulses noted. Extremities:  Without clubbing or  edema.  Impression/Plan: 55 year old lady with history of esophageal stricture and dysphagia now with recurrent symptoms.  History of intermittent GERD.  I have offered the patient an EGD with esophageal dilation as feasible/appropriate per plan.  The risks, benefits, limitations, alternatives and imponderables have been reviewed with the patient. Potential for esophageal dilation, biopsy, etc. have also been reviewed.  Questions have been answered. All parties agreeable.      Notice: This dictation was prepared with Dragon dictation along with smaller phrase technology. Any transcriptional errors that result from this process are unintentional and may not be corrected upon review.

## 2020-07-24 NOTE — Transfer of Care (Signed)
Immediate Anesthesia Transfer of Care Note  Patient: [name]  Procedure(s) Performed: ESOPHAGOGASTRODUODENOSCOPY (EGD) WITH PROPOFOL MALONEY DILATION POLYPECTOMY BIOPSY  Patient Location: Endoscopy Unit  Anesthesia Type:General  Level of Consciousness: awake  Airway & Oxygen Therapy: Patient Spontanous Breathing  Post-op Assessment: Report given to RN and Post -op Vital signs reviewed and stable  Post vital signs: Reviewed and stable  Last Vitals:  Vitals Value Taken Time  BP    Temp    Pulse    Resp    SpO2      Last Pain:  Vitals:   07/24/20 1156  TempSrc:   PainSc: 0-No pain      Patients Stated Pain Goal: 7 (30/94/07 6808)  Complications: No notable events documented.

## 2020-07-27 LAB — SURGICAL PATHOLOGY

## 2020-07-28 ENCOUNTER — Encounter: Payer: Self-pay | Admitting: Internal Medicine

## 2020-07-29 ENCOUNTER — Ambulatory Visit (INDEPENDENT_AMBULATORY_CARE_PROVIDER_SITE_OTHER): Payer: 59 | Admitting: Nurse Practitioner

## 2020-07-29 ENCOUNTER — Other Ambulatory Visit: Payer: Self-pay

## 2020-07-29 ENCOUNTER — Encounter: Payer: 59 | Admitting: Family Medicine

## 2020-07-29 ENCOUNTER — Encounter: Payer: Self-pay | Admitting: Nurse Practitioner

## 2020-07-29 VITALS — BP 126/80 | HR 84 | Temp 98.3°F | Ht 63.0 in | Wt 206.0 lb

## 2020-07-29 DIAGNOSIS — Z6832 Body mass index (BMI) 32.0-32.9, adult: Secondary | ICD-10-CM | POA: Diagnosis not present

## 2020-07-29 DIAGNOSIS — Z136 Encounter for screening for cardiovascular disorders: Secondary | ICD-10-CM | POA: Diagnosis not present

## 2020-07-29 DIAGNOSIS — Z0000 Encounter for general adult medical examination without abnormal findings: Secondary | ICD-10-CM

## 2020-07-29 DIAGNOSIS — Z131 Encounter for screening for diabetes mellitus: Secondary | ICD-10-CM

## 2020-07-29 DIAGNOSIS — Z13220 Encounter for screening for lipoid disorders: Secondary | ICD-10-CM | POA: Diagnosis not present

## 2020-07-29 DIAGNOSIS — F411 Generalized anxiety disorder: Secondary | ICD-10-CM | POA: Diagnosis not present

## 2020-07-29 DIAGNOSIS — Z1329 Encounter for screening for other suspected endocrine disorder: Secondary | ICD-10-CM | POA: Diagnosis not present

## 2020-07-29 DIAGNOSIS — Z0001 Encounter for general adult medical examination with abnormal findings: Secondary | ICD-10-CM | POA: Diagnosis not present

## 2020-07-29 MED ORDER — HYDROXYZINE HCL 10 MG PO TABS: 10.0000 mg | ORAL_TABLET | Freq: Three times a day (TID) | ORAL | 0 refills | Status: DC | PRN

## 2020-07-29 NOTE — Progress Notes (Addendum)
BP 126/80   Pulse 84   Temp 98.3 F (36.8 C)   Ht 5\' 3"  (1.6 m)   Wt 206 lb (93.4 kg)   SpO2 96%   BMI 36.49 kg/m    Subjective:    Patient ID: [name], female    DOB: 1965/10/13, 55 y.o.   MRN: [id number]  HPI: [name] is a 55 y.o. female presenting on 07/29/2020 for comprehensive medical examination. Current medical complaints include:  ANXIETY/STRESS Patient reports increase stress with work lately.  Reports at times, she feels "everything is closing in on" her and they are getting "bombarded with patients."  She also feels like she "is going to come out of my skin ".   Her parents are a bit older and she is a primary caregiver for them as well.    Duration: Acute on chronic Anxious mood: yes  Excessive worrying: yes Irritability: yes  Sweating: no Nausea: no Palpitations:no Hyperventilation: no Panic attacks: yes Agoraphobia: no  Obscessions/compulsions: no Depressed mood: yes Depression screen [facility] 2/9 08/07/2020 07/29/2020 07/29/2019 07/23/2019 07/24/2018  Decreased Interest 0 1 0 0 0  Down, Depressed, Hopeless 0 0 0 0 0  PHQ - 2 Score 0 1 0 0 0  Altered sleeping 0 3 0 - -  Tired, decreased energy 2 3 0 - -  Change in appetite 1 2 0 - -  Feeling bad or failure about yourself  0 2 0 - -  Trouble concentrating 0 2 0 - -  Moving slowly or fidgety/restless 0 1 0 - -  Suicidal thoughts 0 0 0 - -  PHQ-9 Score 3 14 0 - -  Difficult doing work/chores - Somewhat difficult Not difficult at all - -    GAD 7 : Generalized Anxiety Score 08/07/2020 07/29/2020  Nervous, Anxious, on Edge 0 2  Control/stop worrying 0 3  Worry too much - different things 0 3  Trouble relaxing 0 3  Restless 0 0  Easily annoyed or irritable 2 3  Afraid - awful might happen 0 2  Total GAD 7 Score 2 16  Anxiety Difficulty - Somewhat difficult    Anhedonia: yes Weight changes: yes Insomnia: no   Hypersomnia: yes Fatigue/loss of energy: yes Feelings of worthlessness: yes Feelings  of guilt: yes Impaired concentration/indecisiveness: yes Suicidal ideations: no  Crying spells: no Recent Stressors/Life Changes: yes   Relationship problems: no   Family stress: yes     Financial stress: no    Job stress: yes    Recent death/loss: yes  She is considering gastric sleeve.  Is scheduled for session July 14th.  Wondering if she needs referral.  Depression Screen done today and results listed below:  Depression screen [facility] 2/9 08/07/2020 07/29/2020 07/29/2019 07/23/2019 07/24/2018  Decreased Interest 0 1 0 0 0  Down, Depressed, Hopeless 0 0 0 0 0  PHQ - 2 Score 0 1 0 0 0  Altered sleeping 0 3 0 - -  Tired, decreased energy 2 3 0 - -  Change in appetite 1 2 0 - -  Feeling bad or failure about yourself  0 2 0 - -  Trouble concentrating 0 2 0 - -  Moving slowly or fidgety/restless 0 1 0 - -  Suicidal thoughts 0 0 0 - -  PHQ-9 Score 3 14 0 - -  Difficult doing work/chores - Somewhat difficult Not difficult at all - -    The patient does not have a history  of falls. I did not complete a risk assessment for falls. A plan of care for falls was not documented.   Past Medical History:  Past Medical History:  Diagnosis Date   Chronic gastritis    Complication of anesthesia    Depression    Duodenitis 08/2005   Dysphagia    Esophageal dilatation     Surgical History:  Past Surgical History:  Procedure Laterality Date   BIOPSY  07/24/2020   Procedure: BIOPSY;  Surgeon: Daneil Dolin, MD;  [facility];  Service: Endoscopy;;   BREAST BIOPSY Right 04/26/2013   Procedure: BREAST BIOPSY TIMES TWO;  Surgeon: Jamesetta So, MD;  Location: [facility];  Service: General;  Laterality: Right;   BREAST MASS EXCISION     bilateral tumor removal   COLONOSCOPY N/A 01/27/2016   Procedure: COLONOSCOPY;  Surgeon: Danie Binder, MD;  Location: [facility];  Service: Endoscopy;  Laterality: N/A;  [place] N/A  10/11/2013   Procedure: DILATATION & CURETTAGE/HYSTEROSCOPY WITH THERMACHOICE ABLATION;  Surgeon: Florian Buff, MD;  Location: [facility];  Service: Gynecology;  Laterality: N/A;  total therapy time- 20 minutes temp  76 deg C   ESOPHAGEAL DILATION  08/2005   ESOPHAGOGASTRODUODENOSCOPY N/A 01/27/2016   Procedure: ESOPHAGOGASTRODUODENOSCOPY (EGD);  Surgeon: Danie Binder, MD;  [facility];  Service: Endoscopy;  Laterality: N/A;   ESOPHAGOGASTRODUODENOSCOPY (EGD) WITH PROPOFOL N/A 07/24/2020   Procedure: ESOPHAGOGASTRODUODENOSCOPY (EGD) WITH PROPOFOL;  Surgeon: Daneil Dolin, MD;  [facility];  Service: Endoscopy;  Laterality: N/A;  12:30pm   MALONEY DILATION N/A 07/24/2020   Procedure: Venia Minks DILATION;  Surgeon: Daneil Dolin, MD;  [facility];  Service: Endoscopy;  Laterality: N/A;   POLYPECTOMY  07/24/2020   Procedure: POLYPECTOMY;  Surgeon: Daneil Dolin, MD;  [facility];  Service: Endoscopy;;   SAVORY DILATION N/A 01/27/2016   Procedure: SAVORY DILATION;  Surgeon: Danie Binder, MD;  [facility];  Service: Endoscopy;  Laterality: N/A;   TUBAL LIGATION     WISDOM TOOTH EXTRACTION      Medications:  Current Outpatient Medications on File Prior to Visit  Medication Sig   Multiple Vitamins-Minerals (CENTRUM ADULTS PO) Take by mouth.   pantoprazole (PROTONIX) 40 MG tablet Take 40 mg by mouth every morning.   No current facility-administered medications on file prior to visit.    Allergies:  Allergies  Allergen Reactions   Lisinopril Cough    Social History:  Social History   Socioeconomic History   Marital status: Married    Spouse name: Not on file   Number of children: Not on file   Years of education: Not on file   Highest education level: Not on file  Occupational History   Occupation: Full time    Employer: [employer]  Tobacco Use   Smoking status: Never   Smokeless tobacco: Never  Vaping Use    Vaping Use: Never used  Substance and Sexual Activity   Alcohol use: No   Drug use: No   Sexual activity: Not Currently    Birth control/protection: Surgical    Comment: tubal & ablation  Other Topics Concern   Not on file  Social History Narrative   No regular exercise   Social Determinants of Health   Financial Resource Strain: Low Risk    Difficulty of Paying Living Expenses: Not very hard  Food Insecurity:  No Food Insecurity   Worried About Charity fundraiser in the Last Year: Never true   Ran Out of Food in the Last Year: Never true  Transportation Needs: No Transportation Needs   Lack of Transportation (Medical): No   Lack of Transportation (Non-Medical): No  Physical Activity: Inactive   Days of Exercise per Week: 0 days   Minutes of Exercise per Session: 0 min  Stress: No Stress Concern Present   Feeling of Stress : Only a little  Social Connections: Engineer, building services of Communication with Friends and Family: Three times a week   Frequency of Social Gatherings with Friends and Family: Three times a week   Attends Religious Services: More than 4 times per year   Active Member of Clubs or Organizations: Yes   Attends Music therapist: More than 4 times per year   Marital Status: Married  Human resources officer Violence: Not At Risk   Fear of Current or Ex-Partner: No   Emotionally Abused: No   Physically Abused: No   Sexually Abused: No   Social History   Tobacco Use  Smoking Status Never  Smokeless Tobacco Never   Social History   Substance and Sexual Activity  Alcohol Use No    Family History:  Family History  Problem Relation Age of Onset   Hypertension Father    Stroke Father    Heart disease Father    Heart disease Mother    Cancer Paternal Aunt        colon   Cancer Paternal Uncle        colon   Cancer Cousin        colon    Past medical history, surgical history, medications, allergies, family history and social  history reviewed with patient today and changes made to appropriate areas of the chart.   Review of Systems  Constitutional: Negative.   HENT: Negative.    Eyes: Negative.   Respiratory: Negative.    Cardiovascular: Negative.   Gastrointestinal: Negative.   Genitourinary: Negative.   Musculoskeletal: Negative.   Skin: Negative.   Neurological: Negative.   Psychiatric/Behavioral:  Negative for depression. The patient is nervous/anxious.       Objective:    BP 126/80   Pulse 84   Temp 98.3 F (36.8 C)   Ht 5\' 3"  (1.6 m)   Wt 206 lb (93.4 kg)   SpO2 96%   BMI 36.49 kg/m   Wt Readings from Last 3 Encounters:  08/07/20 206 lb 8 oz (93.7 kg)  07/29/20 206 lb (93.4 kg)  07/24/20 200 lb (90.7 kg)    Physical Exam Vitals and nursing note reviewed. Exam conducted with a chaperone present (AW).  Constitutional:      General: She is not in acute distress.    Appearance: Normal appearance. She is obese. She is not ill-appearing or toxic-appearing.  HENT:     Head: Normocephalic and atraumatic.     Right Ear: Tympanic membrane, ear canal and external ear normal. There is no impacted cerumen.     Left Ear: Tympanic membrane, ear canal and external ear normal. There is no impacted cerumen.     Nose: Nose normal. No congestion or rhinorrhea.     Mouth/Throat:     Mouth: Mucous membranes are moist.     Pharynx: Oropharynx is clear. No oropharyngeal exudate.  Eyes:     General: No scleral icterus.    Extraocular Movements: Extraocular movements intact.  Pupils: Pupils are equal, round, and reactive to light.  Cardiovascular:     Rate and Rhythm: Normal rate and regular rhythm.     Pulses: Normal pulses.     Heart sounds: Normal heart sounds. No murmur heard. Pulmonary:     Effort: Pulmonary effort is normal. No respiratory distress.     Breath sounds: No wheezing or rhonchi.  Chest:  Breasts:    Right: Normal. No inverted nipple, mass, nipple discharge or skin change.      Left: Normal. No inverted nipple, mass, nipple discharge or skin change.  Abdominal:     General: Abdomen is flat. Bowel sounds are normal. There is no distension.     Palpations: Abdomen is soft.     Tenderness: There is no abdominal tenderness.  Genitourinary:    Comments: Deferred-has appointment scheduled with GYN and sees them yearly Musculoskeletal:        General: No swelling or tenderness. Normal range of motion.     Cervical back: Normal range of motion and neck supple. No rigidity or tenderness.     Right lower leg: No edema.     Left lower leg: No edema.  Skin:    General: Skin is warm and dry.     Capillary Refill: Capillary refill takes less than 2 seconds.     Coloration: Skin is not jaundiced or pale.  Neurological:     General: No focal deficit present.     Mental Status: She is alert and oriented to person, place, and time.     Motor: No weakness.     Gait: Gait normal.  Psychiatric:        Mood and Affect: Mood normal.        Behavior: Behavior normal.        Thought Content: Thought content normal.        Judgment: Judgment normal.      Assessment & Plan:   Problem List Items Addressed This Visit       Other   Generalized anxiety disorder    Acute on chronic.  PHQ-9 and GAD-7 elevated today.  No SI/HI.  Discussed options including daily medication such as SSRI first rescue medicine like benzodiazepine or hydroxyzine.  Patient elects to proceed with hydroxyzine as she does not want to start something that can be habit-forming.  Discussed side effects including sleepiness and instructed to take first dose at nighttime to monitor for drowsiness.  Follow-up in 1 month.       Relevant Orders   COMPLETE METABOLIC PANEL WITH GFR (Completed)   CBC with Differential/Platelet (Completed)   BMI 32.0-32.9,adult    Patient is interested in gastric sleeve-referral placed to bariatric surgery.  Baseline labs checked today including TSH, hemoglobin A1c, blood counts,  electrolytes and kidney function and liver enzymes.       Relevant Orders   Amb Referral to Bariatric Surgery   Other Visit Diagnoses     Annual physical exam    -  Primary   Screening for thyroid disorder       Relevant Orders   TSH (Completed)   Screening for diabetes mellitus       Relevant Orders   Hemoglobin A1c (Completed)   Encounter for lipid screening for cardiovascular disease       Relevant Orders   Lipid panel (Completed)        Follow up plan: Return in about 4 weeks (around 08/26/2020) for mood f/u.   LABORATORY TESTING:  -  Pap smear:  Has appointment with GYN later this week  IMMUNIZATIONS:   - Tdap: Tetanus vaccination status reviewed: last tetanus booster within 10 years. - Influenza: Up to date - Pneumovax: Not applicable - Prevnar: Not applicable - HPV: Not applicable - Zostavax vaccine:  Recommended-patient is going to think about it - COVID-19 vaccine: Has had 2 Moderna vaccines  SCREENING: -Mammogram: Up to date  - Colonoscopy: Up to date  - Bone Density: Not applicable  -Hearing Test: Not applicable  -Spirometry: Not applicable   PATIENT COUNSELING:    Diet: Encouraged to adjust caloric intake to maintain  or achieve ideal body weight, to reduce intake of dietary saturated fat and total fat, to limit sodium intake by avoiding high sodium foods and not adding table salt, and to maintain adequate dietary potassium and calcium preferably from fresh fruits, vegetables, and low-fat dairy products.    stressed the importance of regular exercise  Injury prevention: Discussed safety belts, safety helmets, smoke detector, smoking near bedding or upholstery.   Dental health: Discussed importance of regular tooth brushing, flossing, and dental visits.    NEXT PREVENTATIVE PHYSICAL DUE IN 1 YEAR. Return in about 4 weeks (around 08/26/2020) for mood f/u.

## 2020-07-30 ENCOUNTER — Encounter (HOSPITAL_COMMUNITY): Payer: Self-pay | Admitting: Internal Medicine

## 2020-07-30 LAB — COMPLETE METABOLIC PANEL WITH GFR
AG Ratio: 1.6 (calc) (ref 1.0–2.5)
ALT: 22 U/L (ref 6–29)
AST: 19 U/L (ref 10–35)
Albumin: 4.5 g/dL (ref 3.6–5.1)
Alkaline phosphatase (APISO): 88 U/L (ref 37–153)
BUN: 17 mg/dL (ref 7–25)
CO2: 27 mmol/L (ref 20–32)
Calcium: 9.5 mg/dL (ref 8.6–10.4)
Chloride: 105 mmol/L (ref 98–110)
Creat: 0.63 mg/dL (ref 0.50–1.05)
GFR, Est African American: 118 mL/min/{1.73_m2} (ref >=60)
GFR, Est Non African American: 102 mL/min/{1.73_m2} (ref >=60)
Globulin: 2.9 g/dL (calc) (ref 1.9–3.7)
Glucose, Bld: 126 mg/dL — ABNORMAL HIGH (ref 65–99)
Potassium: 4 mmol/L (ref 3.5–5.3)
Sodium: 140 mmol/L (ref 135–146)
Total Bilirubin: 0.4 mg/dL (ref 0.2–1.2)
Total Protein: 7.4 g/dL (ref 6.1–8.1)

## 2020-07-30 LAB — CBC WITH DIFFERENTIAL/PLATELET
Absolute Monocytes: 586 cells/uL (ref 200–950)
Basophils Absolute: 93 cells/uL (ref 0–200)
Basophils Relative: 1 %
Eosinophils Absolute: 186 cells/uL (ref 15–500)
Eosinophils Relative: 2 %
HCT: 42.7 % (ref 35.0–45.0)
Hemoglobin: 14.4 g/dL (ref 11.7–15.5)
Lymphs Abs: 2799 cells/uL (ref 850–3900)
MCH: 29 pg (ref 27.0–33.0)
MCHC: 33.7 g/dL (ref 32.0–36.0)
MCV: 86.1 fL (ref 80.0–100.0)
MPV: 10.3 fL (ref 7.5–12.5)
Monocytes Relative: 6.3 %
Neutro Abs: 5636 cells/uL (ref 1500–7800)
Neutrophils Relative %: 60.6 %
Platelets: 325 10*3/uL (ref 140–400)
RBC: 4.96 10*6/uL (ref 3.80–5.10)
RDW: 13.8 % (ref 11.0–15.0)
Total Lymphocyte: 30.1 %
WBC: 9.3 10*3/uL (ref 3.8–10.8)

## 2020-07-30 LAB — LIPID PANEL
Cholesterol: 204 mg/dL — ABNORMAL HIGH (ref <200)
HDL: 40 mg/dL — ABNORMAL LOW (ref >=50)
LDL Cholesterol (Calc): 120 mg/dL (calc) — ABNORMAL HIGH
Non-HDL Cholesterol (Calc): 164 mg/dL (calc) — ABNORMAL HIGH (ref <130)
Total CHOL/HDL Ratio: 5.1 (calc) — ABNORMAL HIGH (ref <5.0)
Triglycerides: 305 mg/dL — ABNORMAL HIGH (ref <150)

## 2020-07-30 LAB — TSH: TSH: 1.75 mIU/L

## 2020-07-30 LAB — HEMOGLOBIN A1C
Hgb A1c MFr Bld: 6.2 % of total Hgb — ABNORMAL HIGH (ref <5.7)
Mean Plasma Glucose: 131 mg/dL
eAG (mmol/L): 7.3 mmol/L

## 2020-07-31 ENCOUNTER — Telehealth: Payer: Self-pay

## 2020-07-31 ENCOUNTER — Encounter: Payer: Self-pay | Admitting: Nurse Practitioner

## 2020-07-31 DIAGNOSIS — F411 Generalized anxiety disorder: Secondary | ICD-10-CM | POA: Insufficient documentation

## 2020-07-31 DIAGNOSIS — Z6837 Body mass index (BMI) 37.0-37.9, adult: Secondary | ICD-10-CM | POA: Insufficient documentation

## 2020-07-31 NOTE — Assessment & Plan Note (Signed)
Acute on chronic.  PHQ-9 and GAD-7 elevated today.  No SI/HI.  Discussed options including daily medication such as SSRI first rescue medicine like benzodiazepine or hydroxyzine.  Patient elects to proceed with hydroxyzine as she does not want to start something that can be habit-forming.  Discussed side effects including sleepiness and instructed to take first dose at nighttime to monitor for drowsiness.  Follow-up in 1 month.

## 2020-07-31 NOTE — Assessment & Plan Note (Signed)
Patient is interested in gastric sleeve-referral placed to bariatric surgery.  Baseline labs checked today including TSH, hemoglobin A1c, blood counts, electrolytes and kidney function and liver enzymes.

## 2020-08-04 NOTE — Telephone Encounter (Signed)
Called pt, lm for her to call bk regarding the concerns mentioned on AVS

## 2020-08-07 ENCOUNTER — Other Ambulatory Visit (HOSPITAL_COMMUNITY)
Admission: RE | Admit: 2020-08-07 | Discharge: 2020-08-07 | Disposition: A | Discharge status: Home / Self Care | Payer: 59 | Source: Ambulatory Visit | Attending: Adult Health | Admitting: Adult Health

## 2020-08-07 ENCOUNTER — Other Ambulatory Visit: Payer: Self-pay

## 2020-08-07 ENCOUNTER — Encounter: Payer: Self-pay | Admitting: Adult Health

## 2020-08-07 ENCOUNTER — Ambulatory Visit (INDEPENDENT_AMBULATORY_CARE_PROVIDER_SITE_OTHER): Payer: 59 | Admitting: Adult Health

## 2020-08-07 VITALS — BP 129/68 | HR 83 | Ht 63.0 in | Wt 206.5 lb

## 2020-08-07 DIAGNOSIS — R232 Flushing: Secondary | ICD-10-CM | POA: Diagnosis not present

## 2020-08-07 DIAGNOSIS — R6882 Decreased libido: Secondary | ICD-10-CM | POA: Diagnosis not present

## 2020-08-07 DIAGNOSIS — N841 Polyp of cervix uteri: Secondary | ICD-10-CM | POA: Insufficient documentation

## 2020-08-07 DIAGNOSIS — Z01419 Encounter for gynecological examination (general) (routine) without abnormal findings: Secondary | ICD-10-CM | POA: Diagnosis not present

## 2020-08-07 DIAGNOSIS — R4589 Other symptoms and signs involving emotional state: Secondary | ICD-10-CM | POA: Diagnosis not present

## 2020-08-07 DIAGNOSIS — N951 Menopausal and female climacteric states: Secondary | ICD-10-CM | POA: Diagnosis not present

## 2020-08-07 DIAGNOSIS — Z1211 Encounter for screening for malignant neoplasm of colon: Secondary | ICD-10-CM | POA: Insufficient documentation

## 2020-08-07 LAB — HEMOCCULT GUIAC POC 1CARD (OFFICE): Fecal Occult Blood, POC: NEGATIVE

## 2020-08-07 NOTE — Addendum Note (Signed)
Addended by: Linton Rump on: 08/07/2020 02:31 PM   Modules accepted: Orders

## 2020-08-07 NOTE — Progress Notes (Signed)
Patient ID: [name], female   DOB: 1965-11-20, 55 y.o.   MRN: [id number] History of Present Illness: [name] is a 55 year old white female,married, G2P2 in for a well woman gyn exam and pap. Last pap 2015. PCP is Renee Chapel NP   Current Medications, Allergies, Past Medical History, Past Surgical History, Family History and Social History were reviewed in [employer] record.     Review of Systems: Patient denies any headaches, hearing loss, fatigue, blurred vision, shortness of breath, chest pain, abdominal pain, problems with bowel movements, urination, or intercourse.(Not active). No joint pain or mood swings.  Periods not regular, maybe 4 a year Moody Decreased libido Hot flashes Wakes up about 2-3 every day   Physical Exam:BP 129/68 (BP Location: Left Arm, Patient Position: Sitting, Cuff Size: Large)   Pulse 83   Ht 5\' 3"  (1.6 m)   Wt 206 lb 8 oz (93.7 kg)   LMP 05/01/2020 (Approximate) Comment: tubal & ablation  BMI 36.58 kg/m  PMP maybe January General:  Well developed, well nourished, no acute distress Skin:  Warm and dry Neck:  Midline trachea, normal thyroid, good ROM, no lymphadenopathy Lungs; Clear to auscultation bilaterally Breast:  No dominant palpable mass, retraction, or nipple discharge Cardiovascular: Regular rate and rhythm Abdomen:  Soft, non tender, no hepatosplenomegaly Pelvic:  External genitalia is normal in appearance, no lesions.  The vagina is normal in appearance. Urethra has no lesions or masses. The cervix is bulbous,+cervical polyp, friable, pap with HR HPV genotyping performed.then polyp removed with forceps.Uterus is felt to be normal size, shape, and contour.  No adnexal masses or tenderness noted.Bladder is non tender, no masses felt. Rectal: Good sphincter tone, no polyps, or hemorrhoids felt.  Hemoccult negative. Extremities/musculoskeletal:  No swelling or varicosities noted, no clubbing or cyanosis Psych:  No  mood changes, alert and cooperative,seems happy AA is 0  Fall risk is low Depression screen [facility] 2/9 08/07/2020 07/29/2020 07/29/2019  Decreased Interest 0 1 0  Down, Depressed, Hopeless 0 0 0  PHQ - 2 Score 0 1 0  Altered sleeping 0 3 0  Tired, decreased energy 2 3 0  Change in appetite 1 2 0  Feeling bad or failure about yourself  0 2 0  Trouble concentrating 0 2 0  Moving slowly or fidgety/restless 0 1 0  Suicidal thoughts 0 0 0  PHQ-9 Score 3 14 0  Difficult doing work/chores - Somewhat difficult Not difficult at all    GAD 7 : Generalized Anxiety Score 08/07/2020 07/29/2020  Nervous, Anxious, on Edge 0 2  Control/stop worrying 0 3  Worry too much - different things 0 3  Trouble relaxing 0 3  Restless 0 0  Easily annoyed or irritable 2 3  Afraid - awful might happen 0 2  Total GAD 7 Score 2 16  Anxiety Difficulty - Somewhat difficult      Upstream - 08/07/20 1159       Pregnancy Intention Screening   Does the patient want to become pregnant in the next year? N/A    Does the patient's partner want to become pregnant in the next year? N/A    Would the patient like to discuss contraceptive options today? N/A      Contraception Wrap Up   Current Method Female Sterilization    End Method Female Sterilization    Contraception Counseling Provided No            Examination chaperoned by Marcie Bal  Young LPN   Impression and Plan: 1. Encounter for gynecological examination with Papanicolaou smear of cervix Pap sent Physical in 1 year Pap in 3 if normal Labs with PCP Mammogram yearly   2. Encounter for screening fecal occult blood testing  3. Cervical polyp Cervical polyp removed and sent to pathology Will talk when pathology back  4. Peri-menopause Discussed HRT,declines for now  5. Moody   6. Hot flashes  7. Decreased libido Try to increase frequency of sex

## 2020-08-12 LAB — SURGICAL PATHOLOGY

## 2020-08-17 LAB — CYTOLOGY - PAP
Comment: NEGATIVE
Diagnosis: NEGATIVE
High risk HPV: NEGATIVE

## 2020-08-28 ENCOUNTER — Ambulatory Visit (INDEPENDENT_AMBULATORY_CARE_PROVIDER_SITE_OTHER): Payer: 59 | Admitting: Nurse Practitioner

## 2020-08-28 ENCOUNTER — Other Ambulatory Visit: Payer: Self-pay

## 2020-08-28 ENCOUNTER — Encounter: Payer: Self-pay | Admitting: Nurse Practitioner

## 2020-08-28 VITALS — BP 142/84 | HR 97 | Temp 99.0°F | Ht 63.0 in | Wt 210.8 lb

## 2020-08-28 DIAGNOSIS — F411 Generalized anxiety disorder: Secondary | ICD-10-CM | POA: Diagnosis not present

## 2020-08-28 DIAGNOSIS — Z6837 Body mass index (BMI) 37.0-37.9, adult: Secondary | ICD-10-CM | POA: Diagnosis not present

## 2020-08-28 MED ORDER — HYDROXYZINE HCL 10 MG PO TABS: 10.0000 mg | ORAL_TABLET | Freq: Three times a day (TID) | ORAL | 1 refills | Status: DC | PRN

## 2020-08-28 NOTE — Assessment & Plan Note (Signed)
Chronic.  PHQ-9 and GAD-7 improved from previous numbers today.  She is tolerating hydroxyzine 10 mg up to three times daily prn well and would like to continue on this dose.  Refills given for 6 months.  We will plan to follow up in 6 months or sooner should her mood worsen.

## 2020-08-28 NOTE — Progress Notes (Signed)
Subjective:    Patient ID: [name], female    DOB: 05/31/1965, 55 y.o.   MRN: [id number]  HPI: [name] is a 55 y.o. female presenting for anxiety follow up.  Chief Complaint  Patient presents with   Anxiety    Taking med prn in the mornings   weightloss   ANXIETY/STRESS At last visit, we started hydroxyzine for anxiety.  She reports this is going well.  She takes one in the morning about 3 days per week.  She sometimes takes a second dose depending on how busy work is.  She does not take on the weekends.  Duration: acute Anxious mood: no  Excessive worrying: no Irritability: yes  Sweating: no Nausea: no Palpitations:no Hyperventilation: no Panic attacks: no Agoraphobia: no  Obscessions/compulsions: no Depressed mood: no Depression screen [facility] 2/9 08/28/2020 08/07/2020 07/29/2020 07/29/2019 07/23/2019  Decreased Interest 0 0 1 0 0  Down, Depressed, Hopeless 0 0 0 0 0  PHQ - 2 Score 0 0 1 0 0  Altered sleeping 0 0 3 0 -  Tired, decreased energy '1 2 3 '$ 0 -  Change in appetite 0 1 2 0 -  Feeling bad or failure about yourself  0 0 2 0 -  Trouble concentrating 0 0 2 0 -  Moving slowly or fidgety/restless 0 0 1 0 -  Suicidal thoughts 0 0 0 0 -  PHQ-9 Score '1 3 14 '$ 0 -  Difficult doing work/chores Not difficult at all - Somewhat difficult Not difficult at all -    GAD 7 : Generalized Anxiety Score 08/28/2020 08/07/2020 07/29/2020  Nervous, Anxious, on Edge 1 0 2  Control/stop worrying 1 0 3  Worry too much - different things 0 0 3  Trouble relaxing 0 0 3  Restless 0 0 0  Easily annoyed or irritable '1 2 3  '$ Afraid - awful might happen 0 0 2  Total GAD 7 Score '3 2 16  '$ Anxiety Difficulty Not difficult at all - Somewhat difficult   Anhedonia: no Weight changes: yes Insomnia: no   Hypersomnia: no Fatigue/loss of energy: yes Feelings of worthlessness: no Feelings of guilt: no Impaired concentration/indecisiveness: no Suicidal ideations: no  Crying spells:  no Recent Stressors/Life Changes: yes   Relationship problems: no   Family stress: no     Financial stress: no    Job stress: yes    Recent death/loss: no  Patient is also requesting letter for insurance from me stating reasons she should receive Bariatric surgery.  She went to Bariatric seminar and would like to proceed with surgery consultation.  Allergies  Allergen Reactions   Lisinopril Cough    Outpatient Encounter Medications as of 08/28/2020  Medication Sig   Multiple Vitamins-Minerals (CENTRUM ADULTS PO) Take by mouth.   pantoprazole (PROTONIX) 40 MG tablet Take 40 mg by mouth every morning.   [DISCONTINUED] hydrOXYzine (ATARAX/VISTARIL) 10 MG tablet Take 10 mg by mouth every 8 (eight) hours as needed. Taking 1-2 time daily prn   hydrOXYzine (ATARAX/VISTARIL) 10 MG tablet Take 1 tablet (10 mg total) by mouth every 8 (eight) hours as needed. Taking 1-2 time daily prn   No facility-administered encounter medications on file as of 08/28/2020.    Patient Active Problem List   Diagnosis Date Noted   Encounter for screening fecal occult blood testing 08/07/2020   Encounter for gynecological examination with Papanicolaou smear of cervix 08/07/2020   Cervical polyp 08/07/2020   Peri-menopause 08/07/2020  Decreased libido 08/07/2020   Hot flashes 08/07/2020   Moody 08/07/2020   Generalized anxiety disorder 07/31/2020   BMI 37.0-37.9, adult 07/31/2020   Nutritional counseling 09/25/2017   Depression with anxiety 08/26/2015   Leiomyoma of uterus, unspecified 09/23/2013   Mixed stress and urge urinary incontinence 07/16/2012   Essential hypertension, benign 07/16/2012   Obesity 07/16/2012   Insomnia 07/16/2012   ESOPHAGEAL REFLUX 09/11/2007   GASTRITIS, ANTRAL 09/11/2007   HEADACHES, HX OF 09/11/2007    Past Medical History:  Diagnosis Date   Chronic gastritis    Complication of anesthesia    Depression    Duodenitis 08/2005   Dysphagia    Esophageal dilatation      Relevant past medical, surgical, family and social history reviewed and updated as indicated. Interim medical history since our last visit reviewed.  Review of Systems Per HPI unless specifically indicated above     Objective:    BP (!) 142/84   Pulse 97   Temp 99 F (37.2 C)   Ht '5\' 3"'$  (1.6 m)   Wt 210 lb 12.8 oz (95.6 kg)   SpO2 96%   BMI 37.34 kg/m   Wt Readings from Last 3 Encounters:  08/28/20 210 lb 12.8 oz (95.6 kg)  08/07/20 206 lb 8 oz (93.7 kg)  07/29/20 206 lb (93.4 kg)    Physical Exam Vitals and nursing note reviewed.  Constitutional:      General: She is not in acute distress.    Appearance: Normal appearance. She is obese. She is not toxic-appearing.  Eyes:     General: No scleral icterus. Skin:    Coloration: Skin is not jaundiced or pale.     Findings: No erythema.  Neurological:     Mental Status: She is alert and oriented to person, place, and time.     Motor: No weakness.     Gait: Gait normal.  Psychiatric:        Mood and Affect: Mood normal.        Behavior: Behavior normal.        Thought Content: Thought content normal.        Judgment: Judgment normal.      Assessment & Plan:   Problem List Items Addressed This Visit       Other   Generalized anxiety disorder - Primary    Chronic.  PHQ-9 and GAD-7 improved from previous numbers today.  She is tolerating hydroxyzine 10 mg up to three times daily prn well and would like to continue on this dose.  Refills given for 6 months.  We will plan to follow up in 6 months or sooner should her mood worsen.        Relevant Medications   hydrOXYzine (ATARAX/VISTARIL) 10 MG tablet   BMI 37.0-37.9, adult    I think patient would be a good candidate for Bariatric surgery.  I have written and sent the letter to her mychart account.  Will plan to follow up in 6 months to recheck labs.  She will reach out to me sooner if needed.           Follow up plan: Return in about 6 months (around  02/28/2021) for predm, anxiety follow up.

## 2020-08-28 NOTE — Assessment & Plan Note (Signed)
I think patient would be a good candidate for Bariatric surgery.  I have written and sent the letter to her mychart account.  Will plan to follow up in 6 months to recheck labs.  She will reach out to me sooner if needed.

## 2020-09-30 DIAGNOSIS — K2100 Gastro-esophageal reflux disease with esophagitis, without bleeding: Secondary | ICD-10-CM | POA: Diagnosis not present

## 2020-09-30 DIAGNOSIS — I10 Essential (primary) hypertension: Secondary | ICD-10-CM | POA: Diagnosis not present

## 2020-10-02 ENCOUNTER — Other Ambulatory Visit (HOSPITAL_COMMUNITY): Payer: Self-pay | Admitting: General Surgery

## 2020-10-07 ENCOUNTER — Encounter: Payer: 59 | Attending: General Surgery | Admitting: Dietician

## 2020-10-07 ENCOUNTER — Other Ambulatory Visit: Payer: Self-pay

## 2020-10-07 ENCOUNTER — Encounter: Payer: Self-pay | Admitting: Dietician

## 2020-10-07 VITALS — Ht 62.0 in | Wt 210.9 lb

## 2020-10-07 DIAGNOSIS — R7303 Prediabetes: Secondary | ICD-10-CM | POA: Insufficient documentation

## 2020-10-07 DIAGNOSIS — K2100 Gastro-esophageal reflux disease with esophagitis, without bleeding: Secondary | ICD-10-CM | POA: Insufficient documentation

## 2020-10-07 DIAGNOSIS — Z79899 Other long term (current) drug therapy: Secondary | ICD-10-CM | POA: Diagnosis not present

## 2020-10-07 DIAGNOSIS — I10 Essential (primary) hypertension: Secondary | ICD-10-CM | POA: Diagnosis not present

## 2020-10-07 DIAGNOSIS — Z6838 Body mass index (BMI) 38.0-38.9, adult: Secondary | ICD-10-CM | POA: Insufficient documentation

## 2020-10-07 DIAGNOSIS — Z713 Dietary counseling and surveillance: Secondary | ICD-10-CM | POA: Diagnosis not present

## 2020-10-07 DIAGNOSIS — E669 Obesity, unspecified: Secondary | ICD-10-CM

## 2020-10-07 NOTE — Patient Instructions (Addendum)
Monitor fluid intake, and aim for 64oz daily or more. Gradually reduce fluid intake during meals, drink before or after.  Work on slower pace for drinking and eating. Chew foods thoroughly, up to 30 times each bite or until pureed consistency. Gradually increase exercise/ physical activity as tolerated.

## 2020-10-07 NOTE — Progress Notes (Signed)
Nutrition Assessment for Bariatric Surgery   Appointment start time: [id number]   end time: 1545  Planned Surgery: sleeve gastrectomy  Anthropometrics: Weight: 210.9lbs Height: 5'2" BMI: 38.57   Patient's Goal Weight: 140lbs  Clinical: Medical History: pre-diabetes, borderline hypertension, acid reflux Medications and Supplements: pantoprazole, biotin, Vitamin D3, multivitamin Relevant labs: 07/29/20 HbA1c 6.2% , cholesterol 204, HDL 40, LDL 120, Triglycerides 305 Notable symptoms: intermittent knee pain Drug allergies: lisinopril Food allergies: cramping, diarrhea with milk  Lifestyle and Dietary History:  Dieting/ weight history:  Patient reports recent gradual increase in weight despite working on diet and lifestyle changes to lose weight.  She did program with phentermine, B12, and low carb diet; stopped after  Thrive plan (supplement patch); Modi (vegetarian, some fasting), weight watchers all with limited success  Disordered or emotional eating history:  no history of or current eating disorder She states she does stress eating. Now using fidget spinner , removing self from situation, to avoid stress eating.   Physical activity: no regular exercise at this time due to knee and ankle pain  Dietary Recall:  Daily pattern: 3 meals and 1-2 snacks. Dining out: 2 meals per week. Breakfast: Premier protein shake (for past several months + apple or cucumber  Snack: pickles, olives, pepperoni slices, and cheese babybel Lunch: leftovers; 10/07/20 scalloped potatoes and ham; salad with shrimp Snack: none or same as evening or am Supper: baked// grilled chicken/ fish with 2 veg corn/ asparagus, peas/ baked potato/ sweet potato Snack: occasionally (2-3 times a week) popcorn; nutty buddy ice cream cone  Beverages: water 2-3 glasses daily; sparkling ice; milo unsweetened tea, circul water with flavor infuser   Nutrition Intervention: Instructed patient on pre-op diet goals and importance of  close adherence to bariatric diet after surgery to avoid side effects and complications.  Discussed stages of the bariatric diet after surgery as well as the importance of adequate protein and fluid intake.  Discussed eating behaviors after surgery to prevent side effects, including taking small bits, chewing thoroughly, avoiding fluids during meals, importance of eating protein first Discussed importance of keeping mindful of weight loss surgery as a tool, and need for permanent lifestyle change to see long term success, as well as need for active lifestyle. Instructed patient on importance of vitamin supplementation after surgery. Provided overview of 2-week pre-op diet.   Nutrition Diagnosis: [place]-3.3 Overweight/ obesity related to history of excess calories and inadequate physical activity as evidenced by patient with current BMI of 38.57, with labwork indicative of pre-diabetes and hypertriglyceridemia, making dietary changes for weight control and good nutritional status prior to bariatric surgery.  Teaching method(s) utilized: Copy provided: Pre-op Goals Diet Stage Template  Learning Readiness: Change in progress  Barriers to learning/ implementing lifestyle change: none  Demonstrated degree of understanding via: Teach Back  Summary: Patient has begun making diet and lifestyle changes in effort to control weight and prepare for bariatric surgery. She has read and researched bariatric surgery prior to deciding to pursue surgery. She has consulted with others who have undergone bariatric surgery. Patient's personal weight goal is 140lbs. She has solid support from coworkers, family.  She agrees to work on slower paced eating, avoiding fluids during meals, and increasing physical activity prior to surgery.  She voices good understanding of the post-op diet and is motivated to follow the bariatric diet after surgery.  From a nutrition standpoint, she is  a good candidate for surgery and is ready to proceed with the bariatric  surgery program.    Plan: Patient commits to returning for RN and RD-led pre-op class prior to surgery.  She will plan to return for post-op RD visits beginning 2 weeks after surgery.

## 2020-10-13 ENCOUNTER — Other Ambulatory Visit (HOSPITAL_COMMUNITY): Payer: Self-pay | Admitting: General Surgery

## 2020-10-13 ENCOUNTER — Other Ambulatory Visit: Payer: Self-pay

## 2020-10-13 ENCOUNTER — Ambulatory Visit (HOSPITAL_COMMUNITY)
Admission: RE | Admit: 2020-10-13 | Discharge: 2020-10-13 | Disposition: A | Discharge status: Home / Self Care | Payer: 59 | Source: Ambulatory Visit | Attending: General Surgery | Admitting: General Surgery

## 2020-10-13 DIAGNOSIS — K219 Gastro-esophageal reflux disease without esophagitis: Secondary | ICD-10-CM | POA: Diagnosis not present

## 2020-10-13 DIAGNOSIS — K449 Diaphragmatic hernia without obstruction or gangrene: Secondary | ICD-10-CM | POA: Diagnosis not present

## 2020-10-13 DIAGNOSIS — Z01818 Encounter for other preprocedural examination: Secondary | ICD-10-CM | POA: Diagnosis not present

## 2020-10-22 ENCOUNTER — Ambulatory Visit (HOSPITAL_COMMUNITY)
Admission: RE | Admit: 2020-10-22 | Discharge: 2020-10-22 | Disposition: A | Discharge status: Home / Self Care | Payer: 59 | Source: Ambulatory Visit | Attending: Nurse Practitioner | Admitting: Nurse Practitioner

## 2020-10-22 ENCOUNTER — Other Ambulatory Visit: Payer: Self-pay

## 2020-10-22 DIAGNOSIS — I10 Essential (primary) hypertension: Secondary | ICD-10-CM | POA: Insufficient documentation

## 2020-10-22 DIAGNOSIS — Z6832 Body mass index (BMI) 32.0-32.9, adult: Secondary | ICD-10-CM | POA: Insufficient documentation

## 2020-11-23 ENCOUNTER — Other Ambulatory Visit: Payer: Self-pay

## 2020-11-23 ENCOUNTER — Encounter: Payer: 59 | Attending: General Surgery | Admitting: Skilled Nursing Facility1

## 2020-11-23 DIAGNOSIS — Z6835 Body mass index (BMI) 35.0-35.9, adult: Secondary | ICD-10-CM | POA: Insufficient documentation

## 2020-11-23 NOTE — Progress Notes (Signed)
Sent message, via epic in basket, requesting orders in epic from surgeon.  

## 2020-11-23 NOTE — Progress Notes (Signed)
Pre-Operative Nutrition Class:    Patient was seen on 11/23/2020 for Pre-Operative Bariatric Surgery Education at [facility]    Surgery date: 12/14/2020 Surgery type: sleeve Start weight at [facility]: 211 Weight today: 211  Samples given per MNT protocol. Patient educated on appropriate usage:  Ensure max exp: March 31, 2021 Ensure max lot: [id number]  Chewable bariatric advantage: advanced multi EA exp: 08/23 Chewable bariatric advantage: advanced multi EA lot: [id number]  Bariatric advantage calcium citrate exp: 02/23 Bariatric advantage calcium citrate lot: [id number]   The following the learning objectives were met by the patient during this course: Identify Pre-Op Dietary Goals and will begin 2 weeks pre-operatively Identify appropriate sources of fluids and proteins  State protein recommendations and appropriate sources pre and post-operatively Identify Post-Operative Dietary Goals and will follow for 2 weeks post-operatively Identify appropriate multivitamin and calcium sources Describe the need for physical activity post-operatively and will follow MD recommendations State when to call healthcare provider regarding medication questions or post-operative complications When having a diagnosis of diabetes understanding hypoglycemia symptoms and the inclusion of 1 complex carbohydrate per meal  Handouts given during class include: Pre-Op Bariatric Surgery Diet Handout Protein Shake Handout Post-Op Bariatric Surgery Nutrition Handout BELT Program Information Flyer Support Group Information Flyer [facility] Bariatric Supplements Price List  Follow-Up Plan: Patient will follow-up at [facility] 2 weeks post operatively for diet advancement per MD.

## 2020-11-27 ENCOUNTER — Ambulatory Visit: Payer: Self-pay | Admitting: General Surgery

## 2020-12-02 DIAGNOSIS — K2100 Gastro-esophageal reflux disease with esophagitis, without bleeding: Secondary | ICD-10-CM | POA: Diagnosis not present

## 2020-12-02 DIAGNOSIS — I10 Essential (primary) hypertension: Secondary | ICD-10-CM | POA: Diagnosis not present

## 2020-12-03 NOTE — Progress Notes (Addendum)
COVID swab appointment: 12/10/20  COVID Vaccine Completed: yes x2  Date COVID Vaccine completed:02/05/19, 02/26/19 Has received booster: COVID vaccine manufacturer:  Moderna    Date of COVID positive in last 90 days: no  PCP - Noemi Chapel, NP Cardiologist - n/a  Chest x-ray - 10/13/20 Epic EKG - 10/22/20 Epic Stress Test - n/a ECHO - n/a Cardiac Cath - n/a Pacemaker/ICD device last checked: n/a Spinal Cord Stimulator: n/a  Sleep Study - n/a CPAP -   Fasting Blood Sugar - pre DM Checks Blood Sugar ___doesn't check at home  Blood Thinner Instructions: n/a Aspirin Instructions: Last Dose:  Activity level: Can go up a flight of stairs and perform activities of daily living without stopping and without symptoms of chest pain or shortness of breath.     Anesthesia review: PONV, RBBB, HTN  Patient denies shortness of breath, fever, cough and chest pain at PAT appointment   Patient verbalized understanding of instructions that were given to them at the PAT appointment. Patient was also instructed that they will need to review over the PAT instructions again at home before surgery.

## 2020-12-04 ENCOUNTER — Other Ambulatory Visit: Payer: Self-pay

## 2020-12-04 ENCOUNTER — Encounter (HOSPITAL_COMMUNITY)
Admission: RE | Admit: 2020-12-04 | Discharge: 2020-12-04 | Disposition: A | Discharge status: Home / Self Care | Payer: 59 | Source: Ambulatory Visit | Attending: General Surgery | Admitting: General Surgery

## 2020-12-04 ENCOUNTER — Encounter (HOSPITAL_COMMUNITY): Payer: Self-pay

## 2020-12-04 VITALS — BP 138/84 | HR 95 | Temp 98.4°F | Ht 62.0 in | Wt 209.4 lb

## 2020-12-04 DIAGNOSIS — Z01812 Encounter for preprocedural laboratory examination: Secondary | ICD-10-CM | POA: Diagnosis not present

## 2020-12-04 DIAGNOSIS — R7303 Prediabetes: Secondary | ICD-10-CM | POA: Diagnosis not present

## 2020-12-04 HISTORY — DX: Other specified postprocedural states: R11.2

## 2020-12-04 HISTORY — DX: Other specified postprocedural states: Z98.890

## 2020-12-04 HISTORY — DX: Essential (primary) hypertension: I10

## 2020-12-04 HISTORY — DX: Gastro-esophageal reflux disease without esophagitis: K21.9

## 2020-12-04 LAB — HEMOGLOBIN A1C
Hgb A1c MFr Bld: 6.4 % — ABNORMAL HIGH (ref 4.8–5.6)
Mean Plasma Glucose: 136.98 mg/dL

## 2020-12-04 LAB — COMPREHENSIVE METABOLIC PANEL
ALT: 19 U/L (ref 0–44)
AST: 19 U/L (ref 15–41)
Albumin: 4.2 g/dL (ref 3.5–5.0)
Alkaline Phosphatase: 77 U/L (ref 38–126)
Anion gap: 8 (ref 5–15)
BUN: 15 mg/dL (ref 6–20)
CO2: 26 mmol/L (ref 22–32)
Calcium: 9.3 mg/dL (ref 8.9–10.3)
Chloride: 106 mmol/L (ref 98–111)
Creatinine, Ser: 0.5 mg/dL (ref 0.44–1.00)
GFR, Estimated: >60 mL/min (ref >60)
Glucose, Bld: 194 mg/dL — ABNORMAL HIGH (ref 70–99)
Potassium: 3.7 mmol/L (ref 3.5–5.1)
Sodium: 140 mmol/L (ref 135–145)
Total Bilirubin: 0.5 mg/dL (ref 0.3–1.2)
Total Protein: 7.9 g/dL (ref 6.5–8.1)

## 2020-12-04 LAB — CBC WITH DIFFERENTIAL/PLATELET
Abs Immature Granulocytes: 0.03 10*3/uL (ref 0.00–0.07)
Basophils Absolute: 0.1 10*3/uL (ref 0.0–0.1)
Basophils Relative: 1 %
Eosinophils Absolute: 0.1 10*3/uL (ref 0.0–0.5)
Eosinophils Relative: 1 %
HCT: 41.7 % (ref 36.0–46.0)
Hemoglobin: 13.7 g/dL (ref 12.0–15.0)
Immature Granulocytes: 0 %
Lymphocytes Relative: 28 %
Lymphs Abs: 2.8 10*3/uL (ref 0.7–4.0)
MCH: 27.9 pg (ref 26.0–34.0)
MCHC: 32.9 g/dL (ref 30.0–36.0)
MCV: 84.9 fL (ref 80.0–100.0)
Monocytes Absolute: 0.7 10*3/uL (ref 0.1–1.0)
Monocytes Relative: 8 %
Neutro Abs: 6.1 10*3/uL (ref 1.7–7.7)
Neutrophils Relative %: 62 %
Platelets: 292 10*3/uL (ref 150–400)
RBC: 4.91 MIL/uL (ref 3.87–5.11)
RDW: 14 % (ref 11.5–15.5)
WBC: 9.8 10*3/uL (ref 4.0–10.5)
nRBC: 0 % (ref 0.0–0.2)

## 2020-12-04 LAB — GLUCOSE, CAPILLARY: Glucose-Capillary: 209 mg/dL — ABNORMAL HIGH (ref 70–99)

## 2020-12-04 NOTE — Patient Instructions (Addendum)
DUE TO COVID-19 ONLY ONE VISITOR IS ALLOWED TO COME WITH YOU AND STAY IN THE WAITING ROOM ONLY DURING PRE OP AND PROCEDURE.   **NO VISITORS ARE ALLOWED IN THE SHORT STAY AREA OR RECOVERY ROOM!!**  IF YOU WILL BE ADMITTED INTO THE HOSPITAL YOU ARE ALLOWED ONLY TWO SUPPORT PEOPLE DURING VISITATION HOURS ONLY (10AM -8PM)   The support person(s) may change daily. The support person(s) must pass our screening, gel in and out, and wear a mask at all times, including in the patient's room. Patients must also wear a mask when staff or their support person are in the room.  No visitors under the age of 68. Any visitor under the age of 50 must be accompanied by an adult.    COVID SWAB TESTING MUST BE COMPLETED ON:  12/10/20 **MUST PRESENT COMPLETED FORM AT TESTING SITE**    [employer]  (backside of the building) No appointment needed. Open 8am-3pm.  You are not required to quarantine, however you are required to wear a well-fitted mask when you are out and around people not in your household.  Hand Hygiene often Do NOT share personal items Notify your provider if you are in close contact with someone who has COVID or you develop fever 100.4 or greater, new onset of sneezing, cough, sore throat, shortness of breath or body aches.       Your procedure is scheduled on: 12/14/20   Report to [facility] Main Entrance    Report to admitting at 8:00 AM   Call this number if you have problems the morning of surgery [phone]   May have clear liquids until 7:15 AM morning of surgery.   MORNING OF SURGERY DRINK:   DRINK 1 G2 drink BEFORE YOU LEAVE HOME, DRINK ALL OF THE  G2 DRINK AT ONE TIME.   NO SOLID FOOD AFTER 600 PM THE NIGHT BEFORE YOUR SURGERY. YOU MAY DRINK CLEAR FLUIDS. THE G2 DRINK YOU DRINK BEFORE YOU LEAVE HOME WILL BE THE LAST FLUIDS YOU DRINK BEFORE SURGERY.  PAIN IS EXPECTED AFTER SURGERY AND WILL NOT BE COMPLETELY ELIMINATED. AMBULATION AND TYLENOL WILL  HELP REDUCE INCISIONAL AND GAS PAIN. MOVEMENT IS KEY!  YOU ARE EXPECTED TO BE OUT OF BED WITHIN 4 HOURS OF ADMISSION TO YOUR PATIENT ROOM.  SITTING IN THE RECLINER THROUGHOUT THE DAY IS IMPORTANT FOR DRINKING FLUIDS AND MOVING GAS THROUGHOUT THE GI TRACT.  COMPRESSION STOCKINGS SHOULD BE WORN Perry UNLESS YOU ARE WALKING.   INCENTIVE SPIROMETER SHOULD BE USED EVERY HOUR WHILE AWAKE TO DECREASE POST-OPERATIVE COMPLICATIONS SUCH AS PNEUMONIA.  WHEN DISCHARGED HOME, IT IS IMPORTANT TO CONTINUE TO WALK EVERY HOUR AND USE THE INCENTIVE SPIROMETER EVERY HOUR.   CLEAR LIQUID DIET  Foods Allowed                                                                     Foods Excluded  Water, Black Coffee and tea (no milk or creamer)           liquids that you cannot  Plain Jell-O in any flavor  (No red)  see through such as: Fruit ices (not with fruit pulp)                                            milk, soups, orange juice              Iced Popsicles (No red)                                               All solid food                                   Apple juices Sports drinks like Gatorade (No red) Lightly seasoned clear broth or consume(fat free) Sugar  Sample Menu Breakfast                                Lunch                                     Supper Cranberry juice                    Beef broth                            Chicken broth Jell-O                                     Grape juice                           Apple juice Coffee or tea                        Jell-O                                      Popsicle                                                Coffee or tea                        Coffee or tea      The day of surgery:  Drink ONE (1) Pre-Surgery G2 by 7:15 am the morning of surgery. Drink in one sitting. Do not sip.  This drink was given to you during your hospital  pre-op appointment visit. Nothing else to  drink after completing the  Pre-Surgery G2.          If you have questions, please contact your surgeon's office.     Oral Hygiene is also important to reduce your risk of infection.  Remember - BRUSH YOUR TEETH THE MORNING OF SURGERY WITH YOUR REGULAR TOOTHPASTE   Take these medicines the morning of surgery with A SIP OF WATER: Protonix                              You may not have any metal on your body including hair pins, jewelry, and body piercing             Do not wear make-up, lotions, powders, perfumes/cologne, or deodorant  Do not wear nail polish including gel and S&S, artificial/acrylic nails, or any other type of covering on natural nails including finger and toenails. If you have artificial nails, gel coating, etc. that needs to be removed by a nail salon please have this removed prior to surgery or surgery may need to be canceled/ delayed if the surgeon/ anesthesia feels like they are unable to be safely monitored.   Do not shave  48 hours prior to surgery.    Do not bring valuables to the hospital. [place].   Contacts, dentures or bridgework may not be worn into surgery.   Bring small overnight bag day of surgery.    Patients discharged on the day of surgery will not be allowed to drive home.              Please read over the following fact sheets you were given: IF YOU HAVE QUESTIONS ABOUT YOUR PRE-OP INSTRUCTIONS PLEASE CALL [phone]   [facility] - Preparing for Surgery Before surgery, you can play an important role.  Because skin is not sterile, your skin needs to be as free of germs as possible.  You can reduce the number of germs on your skin by washing with CHG (chlorahexidine gluconate) soap before surgery.  CHG is an antiseptic cleaner which kills germs and bonds with the skin to continue killing germs even after washing. Please DO NOT use if you have an allergy to CHG or  antibacterial soaps.  If your skin becomes reddened/irritated stop using the CHG and inform your nurse when you arrive at Short Stay. Do not shave (including legs and underarms) for at least 48 hours prior to the first CHG shower.  You may shave your face/neck.  Please follow these instructions carefully:  1.  Shower with CHG Soap the night before surgery and the  morning of surgery.  2.  If you choose to wash your hair, wash your hair first as usual with your normal  shampoo.  3.  After you shampoo, rinse your hair and body thoroughly to remove the shampoo.                             4.  Use CHG as you would any other liquid soap.  You can apply chg directly to the skin and wash.  Gently with a scrungie or clean washcloth.  5.  Apply the CHG Soap to your body ONLY FROM THE NECK DOWN.   Do   not use on face/ open                           Wound or open sores. Avoid contact with eyes, ears mouth and   genitals (private parts).  Wash face,  Genitals (private parts) with your normal soap.             6.  Wash thoroughly, paying special attention to the area where your    surgery  will be performed.  7.  Thoroughly rinse your body with warm water from the neck down.  8.  DO NOT shower/wash with your normal soap after using and rinsing off the CHG Soap.                9.  Pat yourself dry with a clean towel.            10.  Wear clean pajamas.            11.  Place clean sheets on your bed the night of your first shower and do not  sleep with pets. Day of Surgery : Do not apply any lotions/deodorants the morning of surgery.  Please wear clean clothes to the hospital/surgery center.  FAILURE TO FOLLOW THESE INSTRUCTIONS MAY RESULT IN THE CANCELLATION OF YOUR SURGERY  PATIENT SIGNATURE_________________________________  NURSE SIGNATURE__________________________________  ________________________________________________________________________   [name]  An  incentive spirometer is a tool that can help keep your lungs clear and active. This tool measures how well you are filling your lungs with each breath. Taking long deep breaths may help reverse or decrease the chance of developing breathing (pulmonary) problems (especially infection) following: A long period of time when you are unable to move or be active. BEFORE THE PROCEDURE  If the spirometer includes an indicator to show your best effort, your nurse or respiratory therapist will set it to a desired goal. If possible, sit up straight or lean slightly forward. Try not to slouch. Hold the incentive spirometer in an upright position. INSTRUCTIONS FOR USE  Sit on the edge of your bed if possible, or sit up as far as you can in bed or on a chair. Hold the incentive spirometer in an upright position. Breathe out normally. Place the mouthpiece in your mouth and seal your lips tightly around it. Breathe in slowly and as deeply as possible, raising the piston or the ball toward the top of the column. Hold your breath for 3-5 seconds or for as long as possible. Allow the piston or ball to fall to the bottom of the column. Remove the mouthpiece from your mouth and breathe out normally. Rest for a few seconds and repeat Steps 1 through 7 at least 10 times every 1-2 hours when you are awake. Take your time and take a few normal breaths between deep breaths. The spirometer may include an indicator to show your best effort. Use the indicator as a goal to work toward during each repetition. After each set of 10 deep breaths, practice coughing to be sure your lungs are clear. If you have an incision (the cut made at the time of surgery), support your incision when coughing by placing a pillow or rolled up towels firmly against it. Once you are able to get out of bed, walk around indoors and cough well. You may stop using the incentive spirometer when instructed by your caregiver.  RISKS AND COMPLICATIONS Take  your time so you do not get dizzy or light-headed. If you are in pain, you may need to take or ask for pain medication before doing incentive spirometry. It is harder to take a deep breath if you are having pain. AFTER USE Rest and breathe slowly and easily. It can be helpful  to keep track of a log of your progress. Your caregiver can provide you with a simple table to help with this. If you are using the spirometer at home, follow these instructions: [place] IF:  You are having difficultly using the spirometer. You have trouble using the spirometer as often as instructed. Your pain medication is not giving enough relief while using the spirometer. You develop fever of 100.5 F (38.1 C) or higher. SEEK IMMEDIATE MEDICAL CARE IF:  You cough up bloody sputum that had not been present before. You develop fever of 102 F (38.9 C) or greater. You develop worsening pain at or near the incision site. MAKE SURE YOU:  Understand these instructions. Will watch your condition. Will get help right away if you are not doing well or get worse. Document Released: 05/30/2006 Document Revised: 04/11/2011 Document Reviewed: 07/31/2006 Exit[employer] Patient Information 2014 Exit[employer], [place].   ________________________________________________________________________  WHAT IS A BLOOD TRANSFUSION? Blood Transfusion Information  A transfusion is the replacement of blood or some of its parts. Blood is made up of multiple cells which provide different functions. Red blood cells carry oxygen and are used for blood loss replacement. White blood cells fight against infection. Platelets control bleeding. Plasma helps clot blood. Other blood products are available for specialized needs, such as hemophilia or other clotting disorders. BEFORE THE TRANSFUSION  Who gives blood for transfusions?  Healthy volunteers who are fully evaluated to make sure their blood is safe. This is blood bank blood. Transfusion  therapy is the safest it has ever been in the practice of medicine. Before blood is taken from a donor, a complete history is taken to make sure that person has no history of diseases nor engages in risky social behavior (examples are intravenous drug use or sexual activity with multiple partners). The donor's travel history is screened to minimize risk of transmitting infections, such as malaria. The donated blood is tested for signs of infectious diseases, such as HIV and hepatitis. The blood is then tested to be sure it is compatible with you in order to minimize the chance of a transfusion reaction. If you or a relative donates blood, this is often done in anticipation of surgery and is not appropriate for emergency situations. It takes many days to process the donated blood. RISKS AND COMPLICATIONS Although transfusion therapy is very safe and saves many lives, the main dangers of transfusion include:  Getting an infectious disease. Developing a transfusion reaction. This is an allergic reaction to something in the blood you were given. Every precaution is taken to prevent this. The decision to have a blood transfusion has been considered carefully by your caregiver before blood is given. Blood is not given unless the benefits outweigh the risks. AFTER THE TRANSFUSION Right after receiving a blood transfusion, you will usually feel much better and more energetic. This is especially true if your red blood cells have gotten low (anemic). The transfusion raises the level of the red blood cells which carry oxygen, and this usually causes an energy increase. The nurse administering the transfusion will monitor you carefully for complications. HOME CARE INSTRUCTIONS  No special instructions are needed after a transfusion. You may find your energy is better. Speak with your caregiver about any limitations on activity for underlying diseases you may have. SEEK MEDICAL CARE IF:  Your condition is not  improving after your transfusion. You develop redness or irritation at the intravenous (IV) site. SEEK IMMEDIATE MEDICAL CARE IF:  Any of  the following symptoms occur over the next 12 hours: Shaking chills. You have a temperature by mouth above 102 F (38.9 C), not controlled by medicine. Chest, back, or muscle pain. People around you feel you are not acting correctly or are confused. Shortness of breath or difficulty breathing. Dizziness and fainting. You get a rash or develop hives. You have a decrease in urine output. Your urine turns a dark color or changes to pink, red, or brown. Any of the following symptoms occur over the next 10 days: You have a temperature by mouth above 102 F (38.9 C), not controlled by medicine. Shortness of breath. Weakness after normal activity. The white part of the eye turns yellow (jaundice). You have a decrease in the amount of urine or are urinating less often. Your urine turns a dark color or changes to pink, red, or brown. Document Released: 01/15/2000 Document Revised: 04/11/2011 Document Reviewed: 09/03/2007 [facility] Patient Information 2014 [place], [place].  _______________________________________________________________________

## 2020-12-10 ENCOUNTER — Other Ambulatory Visit: Payer: Self-pay | Admitting: General Surgery

## 2020-12-10 LAB — SARS CORONAVIRUS 2 (TAT 6-24 HRS): SARS Coronavirus 2: NEGATIVE

## 2020-12-14 ENCOUNTER — Encounter (HOSPITAL_COMMUNITY): Payer: Self-pay | Admitting: General Surgery

## 2020-12-14 ENCOUNTER — Encounter (HOSPITAL_COMMUNITY)
Admission: RE | Disposition: A | Discharge status: Home / Self Care | Payer: Self-pay | Source: Ambulatory Visit | Attending: General Surgery

## 2020-12-14 ENCOUNTER — Inpatient Hospital Stay (HOSPITAL_COMMUNITY): Payer: 59 | Admitting: Certified Registered Nurse Anesthetist

## 2020-12-14 ENCOUNTER — Inpatient Hospital Stay (HOSPITAL_COMMUNITY)
Admission: RE | Admit: 2020-12-14 | Discharge: 2020-12-15 | DRG: 621 | Disposition: A | Discharge status: Home / Self Care | Payer: 59 | Source: Ambulatory Visit | Attending: General Surgery | Admitting: General Surgery

## 2020-12-14 ENCOUNTER — Inpatient Hospital Stay (HOSPITAL_COMMUNITY): Payer: 59 | Admitting: Physician Assistant

## 2020-12-14 ENCOUNTER — Other Ambulatory Visit: Payer: Self-pay

## 2020-12-14 DIAGNOSIS — Z823 Family history of stroke: Secondary | ICD-10-CM

## 2020-12-14 DIAGNOSIS — K449 Diaphragmatic hernia without obstruction or gangrene: Secondary | ICD-10-CM | POA: Diagnosis present

## 2020-12-14 DIAGNOSIS — Z8249 Family history of ischemic heart disease and other diseases of the circulatory system: Secondary | ICD-10-CM

## 2020-12-14 DIAGNOSIS — Z6838 Body mass index (BMI) 38.0-38.9, adult: Secondary | ICD-10-CM | POA: Diagnosis not present

## 2020-12-14 DIAGNOSIS — E669 Obesity, unspecified: Secondary | ICD-10-CM | POA: Diagnosis present

## 2020-12-14 DIAGNOSIS — Z888 Allergy status to other drugs, medicaments and biological substances status: Secondary | ICD-10-CM

## 2020-12-14 DIAGNOSIS — K2100 Gastro-esophageal reflux disease with esophagitis, without bleeding: Secondary | ICD-10-CM | POA: Diagnosis not present

## 2020-12-14 DIAGNOSIS — K219 Gastro-esophageal reflux disease without esophagitis: Secondary | ICD-10-CM | POA: Diagnosis not present

## 2020-12-14 DIAGNOSIS — Z808 Family history of malignant neoplasm of other organs or systems: Secondary | ICD-10-CM | POA: Diagnosis not present

## 2020-12-14 DIAGNOSIS — I10 Essential (primary) hypertension: Secondary | ICD-10-CM | POA: Diagnosis present

## 2020-12-14 DIAGNOSIS — E66812 Obesity, class 2: Secondary | ICD-10-CM | POA: Diagnosis present

## 2020-12-14 HISTORY — PX: LAPAROSCOPIC GASTRIC SLEEVE RESECTION: SHX5895

## 2020-12-14 HISTORY — PX: HIATAL HERNIA REPAIR: SHX195

## 2020-12-14 HISTORY — PX: UPPER GI ENDOSCOPY: SHX6162

## 2020-12-14 LAB — TYPE AND SCREEN
ABO/RH(D): AB NEG
Antibody Screen: NEGATIVE

## 2020-12-14 LAB — HEMOGLOBIN AND HEMATOCRIT, BLOOD
HCT: 41.2 % (ref 36.0–46.0)
Hemoglobin: 13.9 g/dL (ref 12.0–15.0)

## 2020-12-14 LAB — GLUCOSE, CAPILLARY: Glucose-Capillary: 133 mg/dL — ABNORMAL HIGH (ref 70–99)

## 2020-12-14 LAB — PREGNANCY, URINE: Preg Test, Ur: NEGATIVE

## 2020-12-14 SURGERY — GASTRECTOMY, SLEEVE, LAPAROSCOPIC
Anesthesia: General | Site: Esophagus

## 2020-12-14 MED ORDER — LACTATED RINGERS IR SOLN
Status: DC | PRN
  Administered 2020-12-14: 1

## 2020-12-14 MED ORDER — ROCURONIUM BROMIDE 10 MG/ML (PF) SYRINGE
PREFILLED_SYRINGE | INTRAVENOUS | Status: AC
  Filled 2020-12-14: qty 10

## 2020-12-14 MED ORDER — KETOROLAC TROMETHAMINE 30 MG/ML IJ SOLN: 30.0000 mg | Freq: Once | INTRAMUSCULAR | Status: AC | PRN

## 2020-12-14 MED ORDER — BUPIVACAINE HCL 0.25 % IJ SOLN
INTRAMUSCULAR | Status: AC
  Filled 2020-12-14: qty 1

## 2020-12-14 MED ORDER — MIDAZOLAM HCL 5 MG/5ML IJ SOLN
INTRAMUSCULAR | Status: DC | PRN
Start: 2020-12-14 — End: 2020-12-14
  Administered 2020-12-14: 2 mg via INTRAVENOUS

## 2020-12-14 MED ORDER — ORAL CARE MOUTH RINSE: 15.0000 mL | Freq: Once | OROMUCOSAL | Status: AC

## 2020-12-14 MED ORDER — BUPIVACAINE LIPOSOME 1.3 % IJ SUSP: 20.0000 mL | Freq: Once | INTRAMUSCULAR | Status: DC

## 2020-12-14 MED ORDER — BUPIVACAINE LIPOSOME 1.3 % IJ SUSP
INTRAMUSCULAR | Status: AC
  Filled 2020-12-14: qty 20

## 2020-12-14 MED ORDER — AMISULPRIDE (ANTIEMETIC) 5 MG/2ML IV SOLN
10.0000 mg | Freq: Once | INTRAVENOUS | Status: AC
  Administered 2020-12-14: 10 mg via INTRAVENOUS

## 2020-12-14 MED ORDER — ONDANSETRON HCL 4 MG/2ML IJ SOLN: 4.0000 mg | Freq: Once | INTRAMUSCULAR | Status: AC | PRN

## 2020-12-14 MED ORDER — FENTANYL CITRATE (PF) 100 MCG/2ML IJ SOLN
INTRAMUSCULAR | Status: DC | PRN
  Administered 2020-12-14: 25 ug via INTRAVENOUS
  Administered 2020-12-14: 50 ug via INTRAVENOUS
  Administered 2020-12-14: 25 ug via INTRAVENOUS

## 2020-12-14 MED ORDER — ACETAMINOPHEN 500 MG PO TABS
1000.0000 mg | ORAL_TABLET | ORAL | Status: AC
  Administered 2020-12-14: 1000 mg via ORAL
  Filled 2020-12-14: qty 2

## 2020-12-14 MED ORDER — ACETAMINOPHEN 160 MG/5ML PO SOLN: 1000.0000 mg | Freq: Three times a day (TID) | ORAL | Status: DC

## 2020-12-14 MED ORDER — ONDANSETRON HCL 4 MG/2ML IJ SOLN
INTRAMUSCULAR | Status: DC | PRN
  Administered 2020-12-14: 4 mg via INTRAVENOUS

## 2020-12-14 MED ORDER — SUGAMMADEX SODIUM 200 MG/2ML IV SOLN
INTRAVENOUS | Status: DC | PRN
Start: 2020-12-14 — End: 2020-12-14
  Administered 2020-12-14: 200 mg via INTRAVENOUS

## 2020-12-14 MED ORDER — HYDROMORPHONE HCL 1 MG/ML IJ SOLN
0.2500 mg | INTRAMUSCULAR | Status: DC | PRN
  Administered 2020-12-14: 0.25 mg via INTRAVENOUS

## 2020-12-14 MED ORDER — ONDANSETRON HCL 4 MG/2ML IJ SOLN
INTRAMUSCULAR | Status: AC
  Filled 2020-12-14: qty 2

## 2020-12-14 MED ORDER — DEXTROSE-NACL 5-0.45 % IV SOLN: INTRAVENOUS | Status: DC

## 2020-12-14 MED ORDER — HEPARIN SODIUM (PORCINE) 5000 UNIT/ML IJ SOLN
5000.0000 [IU] | INTRAMUSCULAR | Status: AC
  Administered 2020-12-14: 5000 [IU] via SUBCUTANEOUS
  Filled 2020-12-14: qty 1

## 2020-12-14 MED ORDER — SODIUM CHLORIDE 0.9 % IV SOLN
2.0000 g | INTRAVENOUS | Status: AC
  Administered 2020-12-14: 2 g via INTRAVENOUS
  Filled 2020-12-14: qty 2

## 2020-12-14 MED ORDER — FAMOTIDINE IN NACL 20-0.9 MG/50ML-% IV SOLN
20.0000 mg | Freq: Two times a day (BID) | INTRAVENOUS | Status: DC
  Administered 2020-12-14 – 2020-12-15 (×2): 20 mg via INTRAVENOUS
  Filled 2020-12-14 (×2): qty 50

## 2020-12-14 MED ORDER — LACTATED RINGERS IV SOLN: INTRAVENOUS | Status: DC

## 2020-12-14 MED ORDER — LIDOCAINE HCL (PF) 2 % IJ SOLN
INTRAMUSCULAR | Status: DC | PRN
  Administered 2020-12-14: 100 mg via INTRADERMAL

## 2020-12-14 MED ORDER — DEXAMETHASONE SODIUM PHOSPHATE 10 MG/ML IJ SOLN
INTRAMUSCULAR | Status: AC
  Filled 2020-12-14: qty 1

## 2020-12-14 MED ORDER — PHENYLEPHRINE HCL-NACL 20-0.9 MG/250ML-% IV SOLN
INTRAVENOUS | Status: DC | PRN
  Administered 2020-12-14: 20 ug/min via INTRAVENOUS

## 2020-12-14 MED ORDER — ONDANSETRON HCL 4 MG/2ML IJ SOLN
INTRAMUSCULAR | Status: AC
  Administered 2020-12-14: 4 mg via INTRAVENOUS
  Filled 2020-12-14: qty 2

## 2020-12-14 MED ORDER — PHENYLEPHRINE HCL (PRESSORS) 10 MG/ML IV SOLN
INTRAVENOUS | Status: AC
  Filled 2020-12-14: qty 2

## 2020-12-14 MED ORDER — 0.9 % SODIUM CHLORIDE (POUR BTL) OPTIME
TOPICAL | Status: DC | PRN
  Administered 2020-12-14: 1000 mL

## 2020-12-14 MED ORDER — APREPITANT 40 MG PO CAPS
40.0000 mg | ORAL_CAPSULE | ORAL | Status: AC
  Administered 2020-12-14: 40 mg via ORAL
  Filled 2020-12-14: qty 1

## 2020-12-14 MED ORDER — CHLORHEXIDINE GLUCONATE CLOTH 2 % EX PADS: 6.0000 | MEDICATED_PAD | Freq: Once | CUTANEOUS | Status: DC

## 2020-12-14 MED ORDER — PROPOFOL 10 MG/ML IV BOLUS
INTRAVENOUS | Status: AC
  Filled 2020-12-14: qty 20

## 2020-12-14 MED ORDER — SCOPOLAMINE 1 MG/3DAYS TD PT72
1.0000 | MEDICATED_PATCH | TRANSDERMAL | Status: DC
  Administered 2020-12-14: 1.5 mg via TRANSDERMAL
  Filled 2020-12-14: qty 1

## 2020-12-14 MED ORDER — DEXAMETHASONE SODIUM PHOSPHATE 4 MG/ML IJ SOLN: 4.0000 mg | INTRAMUSCULAR | Status: DC

## 2020-12-14 MED ORDER — HYDRALAZINE HCL 25 MG PO TABS: 25.0000 mg | ORAL_TABLET | Freq: Three times a day (TID) | ORAL | Status: DC | PRN

## 2020-12-14 MED ORDER — PROPOFOL 10 MG/ML IV BOLUS
INTRAVENOUS | Status: DC | PRN
  Administered 2020-12-14: 50 mg via INTRAVENOUS
  Administered 2020-12-14: 150 mg via INTRAVENOUS

## 2020-12-14 MED ORDER — ENOXAPARIN SODIUM 30 MG/0.3ML IJ SOSY
30.0000 mg | PREFILLED_SYRINGE | Freq: Two times a day (BID) | INTRAMUSCULAR | Status: DC
  Administered 2020-12-14 – 2020-12-15 (×2): 30 mg via SUBCUTANEOUS
  Filled 2020-12-14 (×2): qty 0.3

## 2020-12-14 MED ORDER — ONDANSETRON HCL 4 MG/2ML IJ SOLN: 4.0000 mg | INTRAMUSCULAR | Status: DC | PRN

## 2020-12-14 MED ORDER — MORPHINE SULFATE (PF) 4 MG/ML IV SOLN: 1.0000 mg | INTRAVENOUS | Status: DC | PRN

## 2020-12-14 MED ORDER — AMISULPRIDE (ANTIEMETIC) 5 MG/2ML IV SOLN
INTRAVENOUS | Status: AC
  Filled 2020-12-14: qty 4

## 2020-12-14 MED ORDER — ENSURE MAX PROTEIN PO LIQD
2.0000 [oz_av] | ORAL | Status: DC
  Administered 2020-12-15: 2 [oz_av] via ORAL

## 2020-12-14 MED ORDER — BUPIVACAINE HCL 0.25 % IJ SOLN
INTRAMUSCULAR | Status: DC | PRN
  Administered 2020-12-14: 30 mL

## 2020-12-14 MED ORDER — OXYCODONE HCL 5 MG/5ML PO SOLN: 5.0000 mg | Freq: Four times a day (QID) | ORAL | Status: DC | PRN

## 2020-12-14 MED ORDER — ACETAMINOPHEN 500 MG PO TABS
1000.0000 mg | ORAL_TABLET | Freq: Three times a day (TID) | ORAL | Status: DC
  Administered 2020-12-14 – 2020-12-15 (×3): 1000 mg via ORAL
  Filled 2020-12-14 (×3): qty 2

## 2020-12-14 MED ORDER — SIMETHICONE 80 MG PO CHEW: 80.0000 mg | CHEWABLE_TABLET | Freq: Four times a day (QID) | ORAL | Status: DC | PRN

## 2020-12-14 MED ORDER — ROCURONIUM BROMIDE 10 MG/ML (PF) SYRINGE
PREFILLED_SYRINGE | INTRAVENOUS | Status: DC | PRN
  Administered 2020-12-14: 100 mg via INTRAVENOUS

## 2020-12-14 MED ORDER — KETOROLAC TROMETHAMINE 30 MG/ML IJ SOLN
INTRAMUSCULAR | Status: AC
  Administered 2020-12-14: 30 mg via INTRAVENOUS
  Filled 2020-12-14: qty 1

## 2020-12-14 MED ORDER — FENTANYL CITRATE (PF) 100 MCG/2ML IJ SOLN
INTRAMUSCULAR | Status: AC
  Filled 2020-12-14: qty 2

## 2020-12-14 MED ORDER — BUPIVACAINE LIPOSOME 1.3 % IJ SUSP
INTRAMUSCULAR | Status: DC | PRN
  Administered 2020-12-14: 20 mL

## 2020-12-14 MED ORDER — HYDROMORPHONE HCL 1 MG/ML IJ SOLN
INTRAMUSCULAR | Status: AC
  Administered 2020-12-14: 0.25 mg via INTRAVENOUS
  Filled 2020-12-14: qty 1

## 2020-12-14 MED ORDER — DEXAMETHASONE SODIUM PHOSPHATE 10 MG/ML IJ SOLN
INTRAMUSCULAR | Status: DC | PRN
  Administered 2020-12-14: 4 mg via INTRAVENOUS

## 2020-12-14 MED ORDER — MIDAZOLAM HCL 2 MG/2ML IJ SOLN
INTRAMUSCULAR | Status: AC
  Filled 2020-12-14: qty 2

## 2020-12-14 MED ORDER — CHLORHEXIDINE GLUCONATE 0.12 % MT SOLN
15.0000 mL | Freq: Once | OROMUCOSAL | Status: AC
  Administered 2020-12-14: 15 mL via OROMUCOSAL

## 2020-12-14 SURGICAL SUPPLY — 67 items
APL PRP STRL LF DISP 70% ISPRP (MISCELLANEOUS) ×2
APL SKNCLS STERI-STRIP NONHPOA (GAUZE/BANDAGES/DRESSINGS) ×2
APPLIER CLIP ROT 13.4 12 LRG (CLIP) ×3
APR CLP LRG 13.4X12 ROT 20 MLT (CLIP) ×2
BAG COUNTER SPONGE SURGICOUNT (BAG) IMPLANT
BAG LAPAROSCOPIC 12 15 PORT 16 (BASKET) ×2 IMPLANT
BAG RETRIEVAL 12/15 (BASKET) ×3
BAG SPNG CNTER NS LX DISP (BAG)
BENZOIN TINCTURE PRP APPL 2/3 (GAUZE/BANDAGES/DRESSINGS) ×3 IMPLANT
BLADE SURG SZ11 CARB STEEL (BLADE) ×3 IMPLANT
BNDG ADH 1X3 SHEER STRL LF (GAUZE/BANDAGES/DRESSINGS) ×13 IMPLANT
BNDG ADH THN 3X1 STRL LF (GAUZE/BANDAGES/DRESSINGS) ×2
CABLE HIGH FREQUENCY MONO STRZ (ELECTRODE) IMPLANT
CHLORAPREP W/TINT 26 (MISCELLANEOUS) ×3 IMPLANT
CLIP APPLIE ROT 13.4 12 LRG (CLIP) IMPLANT
COVER SURGICAL LIGHT HANDLE (MISCELLANEOUS) ×3 IMPLANT
DECANTER SPIKE VIAL GLASS SM (MISCELLANEOUS) ×3 IMPLANT
DRAPE UTILITY XL STRL (DRAPES) ×6 IMPLANT
ELECT REM PT RETURN 15FT ADLT (MISCELLANEOUS) ×3 IMPLANT
GAUZE 4X4 16PLY ~~LOC~~+RFID DBL (SPONGE) ×3 IMPLANT
GLOVE SURG ENC MOIS LTX SZ6.5 (GLOVE) ×1 IMPLANT
GLOVE SURG ENC MOIS LTX SZ7 (GLOVE) ×2 IMPLANT
GLOVE SURG POLY ORTHO LF SZ7.5 (GLOVE) ×1 IMPLANT
GLOVE SURG POLYISO LF SZ6.5 (GLOVE) ×1 IMPLANT
GLOVE SURG POLYISO LF SZ7 (GLOVE) ×3 IMPLANT
GLOVE SURG UNDER POLY LF SZ6.5 (GLOVE) ×1 IMPLANT
GLOVE SURG UNDER POLY LF SZ7 (GLOVE) ×5 IMPLANT
GOWN STRL REUS W/TWL LRG LVL3 (GOWN DISPOSABLE) ×5 IMPLANT
GOWN STRL REUS W/TWL XL LVL3 (GOWN DISPOSABLE) ×8 IMPLANT
GRASPER SUT TROCAR 14GX15 (MISCELLANEOUS) ×3 IMPLANT
IRRIG SUCT STRYKERFLOW 2 WTIP (MISCELLANEOUS) ×3
IRRIGATION SUCT STRKRFLW 2 WTP (MISCELLANEOUS) ×2 IMPLANT
KIT BASIN OR (CUSTOM PROCEDURE TRAY) ×3 IMPLANT
KIT TURNOVER KIT A (KITS) IMPLANT
MARKER SKIN DUAL TIP RULER LAB (MISCELLANEOUS) ×3 IMPLANT
MAT PREVALON FULL STRYKER (MISCELLANEOUS) ×1 IMPLANT
NDL SPNL 22GX3.5 QUINCKE BK (NEEDLE) ×2 IMPLANT
NEEDLE SPNL 22GX3.5 QUINCKE BK (NEEDLE) ×3 IMPLANT
PACK UNIVERSAL I (CUSTOM PROCEDURE TRAY) ×3 IMPLANT
RELOAD STAPLE 60 3.6 BLU REG (STAPLE) IMPLANT
RELOAD STAPLE 60 3.8 GOLD REG (STAPLE) IMPLANT
RELOAD STAPLE 60 4.1 GRN THCK (STAPLE) IMPLANT
RELOAD STAPLER BLUE 60MM (STAPLE) ×6 IMPLANT
RELOAD STAPLER GOLD 60MM (STAPLE) ×4 IMPLANT
RELOAD STAPLER GREEN 60MM (STAPLE) IMPLANT
SCISSORS LAP 5X45 EPIX DISP (ENDOMECHANICALS) IMPLANT
SET TUBE SMOKE EVAC HIGH FLOW (TUBING) ×3 IMPLANT
SHEARS HARMONIC ACE PLUS 45CM (MISCELLANEOUS) ×3 IMPLANT
SLEEVE GASTRECTOMY 40FR VISIGI (MISCELLANEOUS) ×3 IMPLANT
SLEEVE XCEL OPT CAN 5 100 (ENDOMECHANICALS) ×6 IMPLANT
SOL ANTI FOG 6CC (MISCELLANEOUS) ×2 IMPLANT
SOLUTION ANTI FOG 6CC (MISCELLANEOUS) ×1
STAPLER ECHELON LONG 60 440 (INSTRUMENTS) ×3 IMPLANT
STAPLER RELOAD BLUE 60MM (STAPLE) ×9
STAPLER RELOAD GOLD 60MM (STAPLE) ×6
STAPLER RELOAD GREEN 60MM (STAPLE)
STRIP CLOSURE SKIN 1/2X4 (GAUZE/BANDAGES/DRESSINGS) ×3 IMPLANT
SUT ETHIBOND 0 36 GRN (SUTURE) IMPLANT
SUT MNCRL AB 4-0 PS2 18 (SUTURE) ×3 IMPLANT
SUT VICRYL 0 TIES 12 18 (SUTURE) ×3 IMPLANT
SYR 20ML LL LF (SYRINGE) ×3 IMPLANT
SYR 50ML LL SCALE MARK (SYRINGE) ×3 IMPLANT
TOWEL OR 17X26 10 PK STRL BLUE (TOWEL DISPOSABLE) ×3 IMPLANT
TOWEL OR NON WOVEN STRL DISP B (DISPOSABLE) ×3 IMPLANT
TROCAR BLADELESS 15MM (ENDOMECHANICALS) ×3 IMPLANT
TROCAR BLADELESS OPT 5 100 (ENDOMECHANICALS) ×3 IMPLANT
TUBING CONNECTING 10 (TUBING) ×6 IMPLANT

## 2020-12-14 NOTE — Anesthesia Postprocedure Evaluation (Signed)
Anesthesia Post Note  Patient: [name]  Procedure(s) Performed: LAPAROSCOPIC GASTRIC SLEEVE RESECTION (Abdomen) UPPER GI ENDOSCOPY (Esophagus) HERNIA REPAIR HIATAL (Abdomen)     Patient location during evaluation: PACU Anesthesia Type: General Level of consciousness: awake and alert Pain management: pain level controlled Vital Signs Assessment: post-procedure vital signs reviewed and stable Respiratory status: spontaneous breathing, nonlabored ventilation, respiratory function stable and patient connected to nasal cannula oxygen Cardiovascular status: blood pressure returned to baseline and stable Postop Assessment: no apparent nausea or vomiting Anesthetic complications: no   No notable events documented.  Last Vitals:  Vitals:   12/14/20 1631 12/14/20 1735  BP: (!) 152/86 (!) 136/53  Pulse: 74 80  Resp: 18 18  Temp: 36.6 C 36.7 C  SpO2: 96% 95%    Last Pain:  Vitals:   12/14/20 1735  TempSrc: Oral  PainSc:                  [name]

## 2020-12-14 NOTE — Progress Notes (Signed)

## 2020-12-14 NOTE — Anesthesia Preprocedure Evaluation (Signed)
Anesthesia Evaluation  Patient identified by MRN, date of birth, ID band Patient awake    Reviewed: Allergy & Precautions, NPO status , Patient's Chart, lab work & pertinent test results  History of Anesthesia Complications (+) PONV  Airway Mallampati: II  TM Distance: >3 FB Neck ROM: Full    Dental no notable dental hx.    Pulmonary neg pulmonary ROS,    Pulmonary exam normal breath sounds clear to auscultation       Cardiovascular hypertension, Pt. on medications Normal cardiovascular exam Rhythm:Regular Rate:Normal  Untreated HTN   Neuro/Psych negative neurological ROS  negative psych ROS   GI/Hepatic Neg liver ROS, GERD  ,  Endo/Other  negative endocrine ROS  Renal/GU negative Renal ROS  negative genitourinary   Musculoskeletal negative musculoskeletal ROS (+)   Abdominal   Peds negative pediatric ROS (+)  Hematology negative hematology ROS (+)   Anesthesia Other Findings   Reproductive/Obstetrics negative OB ROS                             Anesthesia Physical Anesthesia Plan  ASA: 3  Anesthesia Plan: General   Post-op Pain Management:    Induction: Intravenous  PONV Risk Score and Plan: 4 or greater and Ondansetron, Dexamethasone, Midazolam, Scopolamine patch - Pre-op and Treatment may vary due to age or medical condition  Airway Management Planned: Oral ETT  Additional Equipment:   Intra-op Plan:   Post-operative Plan: Extubation in OR  Informed Consent: I have reviewed the patients History and Physical, chart, labs and discussed the procedure including the risks, benefits and alternatives for the proposed anesthesia with the patient or authorized representative who has indicated his/her understanding and acceptance.     Dental advisory given  Plan Discussed with: CRNA and Surgeon  Anesthesia Plan Comments:         Anesthesia Quick Evaluation

## 2020-12-14 NOTE — Transfer of Care (Signed)
Immediate Anesthesia Transfer of Care Note  Patient: JAQUITTA DUPRIEST  Procedure(s) Performed: LAPAROSCOPIC GASTRIC SLEEVE RESECTION (Abdomen) UPPER GI ENDOSCOPY (Esophagus) HERNIA REPAIR HIATAL (Abdomen)  Patient Location: PACU  Anesthesia Type:General  Level of Consciousness: drowsy  Airway & Oxygen Therapy: Patient Spontanous Breathing and Patient connected to face mask oxygen  Post-op Assessment: Report given to RN and Post -op Vital signs reviewed and stable  Post vital signs: Reviewed and stable  Last Vitals:  Vitals Value Taken Time  BP 148/76 12/14/20 1328  Temp 36.6 C 12/14/20 1328  Pulse 73 12/14/20 1330  Resp 18 12/14/20 1330  SpO2 99 % 12/14/20 1330  Vitals shown include unvalidated device data.  Last Pain:  Vitals:   12/14/20 1328  TempSrc:   PainSc: Asleep         Complications: No notable events documented.

## 2020-12-14 NOTE — Anesthesia Procedure Notes (Signed)
Procedure Name: Intubation Date/Time: 12/14/2020 11:52 AM Performed by: Milford Cage, CRNA Pre-anesthesia Checklist: Patient identified, Emergency Drugs available, Suction available and Patient being monitored Patient Re-evaluated:Patient Re-evaluated prior to induction Oxygen Delivery Method: Circle system utilized Preoxygenation: Pre-oxygenation with 100% oxygen Induction Type: IV induction Ventilation: Mask ventilation without difficulty Laryngoscope Size: Miller and 2 Grade View: Grade II Tube type: Oral Tube size: 7.0 mm Number of attempts: 2 Airway Equipment and Method: Stylet Placement Confirmation: ETT inserted through vocal cords under direct vision, positive ETCO2 and breath sounds checked- equal and bilateral Secured at: 20 cm Tube secured with: Tape Dental Injury: Teeth and Oropharynx as per pre-operative assessment  Comments: G3 view with MAC 3, G2b view with Mil 2

## 2020-12-14 NOTE — H&P (Signed)
Chief Complaint: weight loss   History of Present Illness: [name] is a 55 y.o. female who is seen today for bariatric discussion.  She has no new medical problems or medications.   Review of Systems: A complete review of systems was obtained from the patient. I have reviewed this information and discussed as appropriate with the patient. See HPI as well for other ROS.  Review of Systems  Constitutional: Negative.  HENT: Negative.  Eyes: Negative.  Respiratory: Negative.  Cardiovascular: Negative.  Gastrointestinal: Negative.  Genitourinary: Negative.  Musculoskeletal: Negative.  Skin: Negative.  Neurological: Negative.  Endo/Heme/Allergies: Negative.  Psychiatric/Behavioral: Negative.    Medical History: Past Medical History:  Diagnosis Date   Anxiety   GERD (gastroesophageal reflux disease)   There is no problem list on file for this patient.  Past Surgical History:  Procedure Laterality Date   ESSURE TUBAL LIGATION   INCISIONAL BIOPSY BREAST   wisdom teeth    Allergies  Allergen Reactions   Lisinopril Cough   Current Outpatient Medications on File Prior to Visit  Medication Sig Dispense Refill   hydrOXYzine HCL (ATARAX) 10 MG tablet Take by mouth   multivitamin-Ca-iron-minerals Tab Take by mouth   pantoprazole (PROTONIX) 40 MG DR tablet Take 40 mg by mouth every morning   No current facility-administered medications on file prior to visit.   Family History  Problem Relation Age of Onset   Stroke Father   Skin cancer Father   High blood pressure (Hypertension) Father   Coronary Artery Disease (Blocked arteries around heart) Father    Social History   Tobacco Use  Smoking Status Never Smoker  Smokeless Tobacco Never Used    Social History   Socioeconomic History   Marital status: Married  Tobacco Use   Smoking status: Never Smoker   Smokeless tobacco: Never Used  Substance and Sexual Activity   Alcohol use: Never   Drug use: Never    Objective:   Vitals:  12/02/20 0955  BP: (!) 140/96  Pulse: 98  Temp: 36.8 C (98.3 F)  SpO2: 98%  Weight: 96.2 kg (212 lb)  Height: 157.5 cm (5\' 2" )   Body mass index is 38.78 kg/m.  Physical Exam Constitutional:  Appearance: Normal appearance.  HENT:  Head: Normocephalic and atraumatic.  Pulmonary:  Effort: Pulmonary effort is normal.  Musculoskeletal:  General: Normal range of motion.  Cervical back: Normal range of motion.  Neurological:  General: No focal deficit present.  Mental Status: She is alert and oriented to person, place, and time. Mental status is at baseline.  Psychiatric:  Mood and Affect: Mood normal.  Behavior: Behavior normal.  Thought Content: Thought content normal.     Assessment and Plan:  Diagnoses and all orders for this visit:  Class 2 severe obesity with body mass index (BMI) of 35 to 39.9 with serious comorbidity (CMS-HCC)  Gastroesophageal reflux disease with esophagitis without hemorrhage  Hypertension, essential   The patient meets weight loss surgery criteria. Due to the above reasons, I think laparoscopic vertical sleeve gastrectomy is the best option for the patient.   We discussed LSG. We discussed the preoperative, operative and postoperative process. I explained the surgery in detail including the performance of an EGD near the end of the surgery to test for leak. We discussed the typical hospital course including a 1-2 day stay baring any complications. The patient was given educational material. I quoted the patient that most patients can lose up to 50-70% of their excess  weight. We did discuss the possibility of weight regain several years after the procedure.   The risks of infection, bleeding, pain, scarring, weight regain, too little or too much weight loss, vitamin deficiencies and need for lifelong vitamin supplementation, hair loss, need for protein supplementation, leaks, stricture, reflux, food intolerance,  gallstone formation, hernia, need for reoperation, need for open surgery, injury to spleen or surrounding structures, DVT's, PE, and death again discussed with the patient and the patient expressed understanding and desires to proceed with laparoscopic sleeve gastrectomy, possible open, intraoperative endoscopy.  We discussed that before and after surgery that there would be an alteration in their diet. I explained that we may put them on a diet 2 weeks before surgery. I also explained that they would be on a liquid diet for 2 weeks after surgery. We discussed that they would have to avoid certain foods after surgery. We discussed the importance of physical activity as well as compliance with our dietary and supplement recommendations and routine follow-up.

## 2020-12-14 NOTE — Op Note (Signed)
[name] [id number] 1965-07-11 12/14/2020  Preoperative diagnosis: morbid obesity with sleeve gastrectomy and hiatal hernia repair in progress  Postoperative diagnosis: Same   Procedure: Upper endoscopy   Surgeon: Catalina Antigua B. Hassell Done  M.D., FACS   Anesthesia: Gen.   Indications for procedure: This patient was undergoing a sleeve gastrectomy and hiatal hernia repair by Dr. Kieth Brightly.    Description of procedure: The endoscopy was placed in the mouth and into the oropharynx and under endoscopic vision it was advanced to the esophagogastric junction.  The pouch was insufflated and the sleeve was cylindrical and looked good.   No bleeding or leaks were detected.  The scope was withdrawn without difficulty.     Matt B. Hassell Done, MD, FACS General, Bariatric, & Minimally Invasive Surgery [facility], [place]

## 2020-12-14 NOTE — Op Note (Signed)
Preop Diagnosis: Obesity Class II with major comorbidity  Postop Diagnosis: same  Procedure performed: laparoscopic Sleeve Gastrectomy  Assitant: Kaylyn Lim  Indications:  The patient is a 55 y.o. year-old morbidly obese female who has been followed in the Bariatric Clinic as an outpatient. This patient was diagnosed with morbid obesity with a BMI of Body mass index is 38.37 kg/m. and significant co-morbidities including GERD.  The patient was counseled extensively in the Bariatric Outpatient Clinic and after a thorough explanation of the risks and benefits of surgery (including death from complications, bowel leak, infection such as peritonitis and/or sepsis, internal hernia, bleeding, need for blood transfusion, bowel obstruction, organ failure, pulmonary embolus, deep venous thrombosis, wound infection, incisional hernia, skin breakdown, and others entailed on the consent form) and after a compliant diet and exercise program, the patient was scheduled for an elective laparoscopic sleeve gastrectomy.  Description of Operation:  Following informed consent, the patient was taken to the operating room and placed on the operating table in the supine position.  She had previously received prophylactic antibiotics and subcutaneous heparin for DVT prophylaxis in the pre-op holding area.  After induction of general endotracheal anesthesia by the anesthesiologist, the patient underwent placement of sequential compression devices and an oro-gastric tube.  A timeout was confirmed by the surgery and anesthesia teams.  The patient was adequately padded at all pressure points and placed on a footboard to prevent slippage from the OR table during extremes of position during surgery.  She underwent a routine sterile prep and drape of her entire abdomen.    Next, A transverse incision was made under the left subcostal area and a 57mm optical viewing trocar was introduced into the peritoneal cavity. Pneumoperitoneum  was applied with a high flow and low pressure. A laparoscope was inserted to confirm placement. A extraperitoneal block was then placed at the lateral abdominal wall using exparel diluted with marcaine. 5 additional incisions were placed: 1 17mm trocar to the left of the midline. 1 additional 25mm trocar in the left lateral area, 1 22mm trocar in the right mid abdomen, 1 70mm trocar in the right subcostal area, and a Nathanson retractor was placed through a subxiphoid incision.  The UGI showed a small hiatal hernia. Therefore, the pars flaccida was incised with harmonic scalpel. The stomach was reduced but on dissection of the posterior crus there was a visible hernia with small sac. The sac was dissected free and 1 0 ethibond sutures placed in interrupted fashion. A calibration tube was passed to ensure appropriate size of the hiatus.   Next, a hole was created through the lesser omentum along the greater curve of the stomach to enter the lesser sac. The vessels along the greater omentum were  Then ligated and divided using the Harmonic scalpel moving towards the spleen and then short gastric vessels were ligated and divided in the same fashion to fully mobilize the fundus. The left crus was identified to ensure completion of the dissection. Next the antrum was measured and dissection continued inferiorly along the greater curve towards the pylorus and stopped 6cm from the pylorus.   A 40Fr ViSiGi dilator was placed into the esophgaus and along the lesser curve of the stomach and placed on suction.2 65mm Gold load echelon stapler(s) followed by 3 37mm blue load echelon stapler(s) were used to make the resection along the antrum being sure to stay well away from the angularis by angling the jaws of the stapler towards the greater curve and later  completing the resection staying along the [place] and ensuring the fundus was not retained by appropriately retracting it lateral. Air was inserted through the [place] to  perform a leak test showing no bubbles and a neutral lie of the stomach.  The assistant then went and performed an upper endoscopy and leak test. Clips were placed on 3 areas of staple line bleeding. No bubbles were seen and the sleeve and antrum distended appropriately. The specimen was then placed in an endocatch bag and removed by the 69mm port. The fascia of the 38mm port was closed with a 0 vicryl by suture passer. Hemostasis was ensured. Pneumoperitoneum was evacuated, all ports were removed and all incisions closed with 4-0 monocryl suture in subcuticular fashion. Steristrips and bandaids were put in place for dressing. The patient awoke from anesthesia and was brought to pacu in stable condition. All counts were correct.  Estimated blood loss: <1ml  Specimens:  Sleeve gastrectomy  Local Anesthesia: 50 ml Exparel:0.5% Marcaine mix  Post-Op Plan:       Pain Management: PO, prn      Antibiotics: Prophylactic      Anticoagulation: Prophylactic, Starting now      Post Op Studies/Consults: Not applicable      Intended Discharge: within 48h      Intended Outpatient Follow-Up: Two Week      Intended Outpatient Studies: Not Applicable      Other: Not Applicable  Arta Bruce Kellar [place]

## 2020-12-14 NOTE — Progress Notes (Signed)
PHARMACY CONSULT FOR:  Risk Assessment for Post-Discharge VTE Following Bariatric Surgery  Post-Discharge VTE Risk Assessment: This patient's probability of 30-day post-discharge VTE is increased due to the factors marked:   Female    Age >/=60 years    BMI >/=50 kg/m2    CHF    Dyspnea at Rest    Paraplegia  X  Non-gastric-band surgery    Operation Time >/=3 hr    Return to OR     Length of Stay >/= 3 d   Hx of VTE   Hypercoagulable condition   Significant venous stasis    Predicted probability of 30-day post-discharge VTE: 0.16%  Other patient-specific factors to consider: N/Renee  Recommendation for Discharge: No pharmacologic prophylaxis post-discharge  [name] is Renee 55 y.o. female who underwent sleeve gastrectomy and hiatal hernia repair on 12/14/2020.    Allergies  Allergen Reactions   Lisinopril Cough    Patient Measurements: Weight: 95.2 kg (209 lb 12.8 oz) Body mass index is 38.37 kg/m.  No results for input(s): WBC, HGB, HCT, PLT, APTT, CREATININE, LABCREA, CREATININE, CREAT24HRUR, MG, PHOS, ALBUMIN, PROT, ALBUMIN, AST, ALT, ALKPHOS, BILITOT, BILIDIR, IBILI in the last 72 hours. Estimated Creatinine Clearance: 85.4 mL/min (by C-G formula based on SCr of 0.5 mg/dL).    Past Medical History:  Diagnosis Date   Chronic gastritis    Complication of anesthesia    Depression    Duodenitis 08/2005   Dysphagia    Esophageal dilatation    GERD (gastroesophageal reflux disease)    Hypertension    PONV (postoperative nausea and vomiting)      Medications Prior to Admission  Medication Sig Dispense Refill Last Dose   Multiple Vitamins-Minerals (CENTRUM ADULTS PO) Take 1 tablet by mouth daily.   Past Month   pantoprazole (PROTONIX) 40 MG tablet Take 40 mg by mouth every morning.   12/14/2020 at 0530   hydrOXYzine (ATARAX/VISTARIL) 10 MG tablet Take 1 tablet (10 mg total) by mouth every 8 (eight) hours as needed. Taking 1-2 time daily prn (Patient not taking:  No sig reported) 90 tablet 1 Not Taking       [name] [name] 12/14/2020,2:03 PM

## 2020-12-15 ENCOUNTER — Encounter (HOSPITAL_COMMUNITY): Payer: Self-pay | Admitting: General Surgery

## 2020-12-15 LAB — CBC WITH DIFFERENTIAL/PLATELET
Abs Immature Granulocytes: 0.04 10*3/uL (ref 0.00–0.07)
Basophils Absolute: 0 10*3/uL (ref 0.0–0.1)
Basophils Relative: 0 %
Eosinophils Absolute: 0 10*3/uL (ref 0.0–0.5)
Eosinophils Relative: 0 %
HCT: 40.3 % (ref 36.0–46.0)
Hemoglobin: 13.7 g/dL (ref 12.0–15.0)
Immature Granulocytes: 0 %
Lymphocytes Relative: 13 %
Lymphs Abs: 1.3 10*3/uL (ref 0.7–4.0)
MCH: 28.8 pg (ref 26.0–34.0)
MCHC: 34 g/dL (ref 30.0–36.0)
MCV: 84.7 fL (ref 80.0–100.0)
Monocytes Absolute: 0.6 10*3/uL (ref 0.1–1.0)
Monocytes Relative: 6 %
Neutro Abs: 8.5 10*3/uL — ABNORMAL HIGH (ref 1.7–7.7)
Neutrophils Relative %: 81 %
Platelets: 310 10*3/uL (ref 150–400)
RBC: 4.76 MIL/uL (ref 3.87–5.11)
RDW: 14.1 % (ref 11.5–15.5)
WBC: 10.5 10*3/uL (ref 4.0–10.5)
nRBC: 0 % (ref 0.0–0.2)

## 2020-12-15 LAB — COMPREHENSIVE METABOLIC PANEL
ALT: 27 U/L (ref 0–44)
AST: 28 U/L (ref 15–41)
Albumin: 4.2 g/dL (ref 3.5–5.0)
Alkaline Phosphatase: 77 U/L (ref 38–126)
Anion gap: 10 (ref 5–15)
BUN: 9 mg/dL (ref 6–20)
CO2: 21 mmol/L — ABNORMAL LOW (ref 22–32)
Calcium: 9.1 mg/dL (ref 8.9–10.3)
Chloride: 107 mmol/L (ref 98–111)
Creatinine, Ser: 0.62 mg/dL (ref 0.44–1.00)
GFR, Estimated: >60 mL/min (ref >60)
Glucose, Bld: 206 mg/dL — ABNORMAL HIGH (ref 70–99)
Potassium: 4 mmol/L (ref 3.5–5.1)
Sodium: 138 mmol/L (ref 135–145)
Total Bilirubin: 0.6 mg/dL (ref 0.3–1.2)
Total Protein: 7.9 g/dL (ref 6.5–8.1)

## 2020-12-15 LAB — SURGICAL PATHOLOGY

## 2020-12-15 MED ORDER — ACETAMINOPHEN 500 MG PO TABS: 1000.0000 mg | ORAL_TABLET | Freq: Three times a day (TID) | ORAL | 0 refills | Status: AC

## 2020-12-15 MED ORDER — ONDANSETRON 4 MG PO TBDP: 4.0000 mg | ORAL_TABLET | Freq: Four times a day (QID) | ORAL | 0 refills | Status: DC | PRN

## 2020-12-15 NOTE — Discharge Summary (Signed)
Physician Discharge Summary  [name] BEM:[id number] DOB: May 14, 1965 DOA: 12/14/2020  PCP: Eulogio Bear, NP  Admit date: 12/14/2020 Discharge date: 12/15/2020  Recommendations for Outpatient Follow-up:   (include homehealth, outpatient follow-up instructions, specific recommendations for PCP to follow-up on, etc.)   Discharge Diagnoses:  Active Problems:   Class II obesity   Surgical Procedure: laparoscopic sleeve gastrectomy, upper endoscopy  Discharge Condition: Good Disposition: Home  Diet recommendation: Postoperative sleeve gastrectomy diet (liquids only)  Filed Weights   12/14/20 0819  Weight: 95.2 kg     Hospital Course:  The patient was admitted after undergoing laparoscopic sleeve gastrectomy. POD 0 she ambulated well. POD 1 she was started on the water diet protocol and tolerated 500 ml in the first shift. Once meeting the water amount she was advanced to bariatric protein shakes which they tolerated and were discharged home POD 1.  Treatments: surgery: laparoscopic sleeve gastrectomy  Discharge Instructions  Discharge Instructions     Ambulate hourly while awake   Complete by: As directed    Call MD for:  difficulty breathing, headache or visual disturbances   Complete by: As directed    Call MD for:  persistant dizziness or light-headedness   Complete by: As directed    Call MD for:  persistant nausea and vomiting   Complete by: As directed    Call MD for:  redness, tenderness, or signs of infection (pain, swelling, redness, odor or green/yellow discharge around incision site)   Complete by: As directed    Call MD for:  severe uncontrolled pain   Complete by: As directed    Call MD for:  temperature >101 F   Complete by: As directed    Diet bariatric full liquid   Complete by: As directed    Discharge wound care:   Complete by: As directed    Remove Bandaids tomorrow, ok to shower tomorrow. Steristrips may fall off in 1-3 weeks.    Incentive spirometry   Complete by: As directed    Perform hourly while awake      Allergies as of 12/15/2020       Reactions   Lisinopril Cough        Medication List     STOP taking these medications    hydrOXYzine 10 MG tablet Commonly known as: ATARAX/VISTARIL       TAKE these medications    acetaminophen 500 MG tablet Commonly known as: TYLENOL Take 2 tablets (1,000 mg total) by mouth every 8 (eight) hours for 5 days.   CENTRUM ADULTS PO Take 1 tablet by mouth daily.   ondansetron 4 MG disintegrating tablet Commonly known as: ZOFRAN-ODT Take 1 tablet (4 mg total) by mouth every 6 (six) hours as needed for nausea or vomiting.   pantoprazole 40 MG tablet Commonly known as: PROTONIX Take 40 mg by mouth every morning.               Discharge Care Instructions  (From admission, onward)           Start     Ordered   12/15/20 0000  Discharge wound care:       Comments: Remove Bandaids tomorrow, ok to shower tomorrow. Steristrips may fall off in 1-3 weeks.   12/15/20 0913              The results of significant diagnostics from this hospitalization (including imaging, microbiology, ancillary and laboratory) are listed below for reference.    Significant Diagnostic Studies:  No results found.  Labs: Basic Metabolic Panel: Recent Labs  Lab 12/15/20 0406  NA 138  K 4.0  CL 107  CO2 21*  GLUCOSE 206*  BUN 9  CREATININE 0.62  CALCIUM 9.1   Liver Function Tests: Recent Labs  Lab 12/15/20 0406  AST 28  ALT 27  ALKPHOS 77  BILITOT 0.6  PROT 7.9  ALBUMIN 4.2    CBC: Recent Labs  Lab 12/14/20 1442 12/15/20 0406  WBC  --  10.5  NEUTROABS  --  8.5*  HGB 13.9 13.7  HCT 41.2 40.3  MCV  --  84.7  PLT  --  310    CBG: Recent Labs  Lab 12/14/20 0816  GLUCAP 133*    Active Problems:   Class II obesity   VTE plan: no chemical prophylaxis  recommended ([email or web address])  Time coordinating discharge: 15 min

## 2020-12-15 NOTE — Discharge Instructions (Signed)

## 2020-12-15 NOTE — Progress Notes (Signed)
24hr fluid recall: 952mL.  Per dehydration protocol, will call pt to f/u within one week post op.

## 2020-12-15 NOTE — Progress Notes (Signed)
Patient alert and oriented, pain is controlled. Patient is tolerating fluids, advanced to protein shake today, patient is tolerating well.  Reviewed Gastric sleeve discharge instructions with patient and patient is able to articulate understanding.  Provided information on BELT program, Support Group and [facility] outpatient pharmacy. All questions answered, will continue to monitor.  

## 2020-12-16 ENCOUNTER — Other Ambulatory Visit: Payer: Self-pay | Admitting: *Deleted

## 2020-12-16 NOTE — Patient Outreach (Signed)
[place] [facility]) Care Management  12/16/2020  [name] 10-Dec-1965 [id number]   Transition of care telephone call  Referral received:12/14/20 Initial outreach:12/16/20 Insurance: [facility]   Initial unsuccessful telephone call to patient's preferred number in order to complete transition of care assessment; no answer, left HIPAA compliant voicemail message requesting return call.   Objective: Per the electronic medical record, [name]  was hospitalized at [facility] 11/14-11/15/22 for Laparoscopic gastric sleeve resection . PMHx includes: Obesity, hypertension include: She was discharged to home on 12/15/20 without the need for home health services or durable medical equipment per the discharge summary.   Plan: This RNCM will route unsuccessful outreach letter with [place] Management pamphlet and 24 hour Nurse Advice Line Magnet to [place] Management clinical pool to be mailed to patient's home address. This RNCM will attempt another outreach within 4 business days.   Joylene Draft, RN, BSN  [place] Management Coordinator  [phone]- Mobile [phone]- Toll Free Main Office

## 2020-12-18 ENCOUNTER — Telehealth (HOSPITAL_COMMUNITY): Payer: Self-pay | Admitting: *Deleted

## 2020-12-18 NOTE — Telephone Encounter (Signed)
1.  Tell me about your pain and pain management? Pt c/o right lower abdominal incisional pain with movement and exertion.  Pt states that she has been taking Tylenol. Discussed with patient to try and splint her abdomen with changing positions to assist with the discomfort.  Encouraged pt to try options and/or contact [facility] if still concerned.   2.  Let's talk about fluid intake.  How much total fluid are you taking in? Pt states that she is working to meet goal of 64 oz of fluid today.  Pt has consumed one bottle of water with unflavored protein.  Pt encouraged to drink more clear liquids to help with meeting fluid goal.  Pt plans to drink rest of protein, water and broth for the remainder of the day to meet goal.  Pt instructed to assess status and suggestions daily utilizing Hydration Action Plan on discharge folder and to call [facility] if in the "red zone".   3.  How much protein have you taken in the last 2 days? Pt states she is meeting her goal of 60g of protein each day with the protein shakes.  4.  Have you had nausea?  Tell me about when have experienced nausea and what you did to help? Pt denies nausea.   5.  Has the frequency or color changed with your urine? Pt states that she is urinating "fine" with no changes in frequency or urgency.     6.  Tell me what your incisions look like? "Incisions look fine". Pt denies a fever, chills.  Pt states incisions are not swollen, open, or draining.  Pt encouraged to call [facility] if incisions change.   7.  Have you been passing gas? BM? Pt states that she is having BMs. Last BM 12/18/20.    8.  If a problem or question were to arise who would you call?  Do you know contact numbers for [place], [facility], and [facility]? Pt denies dehydration symptoms.  Pt can describe s/sx of dehydration.  Pt knows to call [facility] for surgical, [facility] for nutrition, and [facility] for non-urgent questions or concerns.   9.  How has the walking going? Pt states she is walking around and able to be  active without difficulty. Pt is currently out running errands.   10. Are you still using your incentive spirometer?  If so, how often? Pt states that she does it a couple of times a day. Pt encouraged to use incentive spirometer, at least 10x every hour while awake until she sees the surgeon.  11.  How are your vitamins and calcium going?  How are you taking them? Pt states that she starting taking supplements today.  Reinforced teaching to take supplements at least two hours apart.  Reminded patient that the first 30 days post-operatively are important for successful recovery.  Practice good hand hygiene, wearing a mask when appropriate (since optional in most places), and minimizing exposure to people who live outside of the home, especially if they are exhibiting any respiratory, GI, or illness-like symptoms.

## 2020-12-21 ENCOUNTER — Other Ambulatory Visit: Payer: Self-pay | Admitting: *Deleted

## 2020-12-21 NOTE — Patient Outreach (Signed)
[facility]) Care Management  12/21/2020  [name] 1966/01/08 [id number]    Transition of care telephone call   Referral received:12/14/20 Initial outreach:12/16/20 Insurance: [place] attempt #2, unsuccessful telephone call to patient's preferred number in order to complete transition of care assessment; no answer, left HIPAA compliant voicemail message requesting return call.    Objective: Per the electronic medical record, [name]  was hospitalized at [facility] 11/14-11/15/22 for Laparoscopic gastric sleeve resection . PMHx includes: Obesity, hypertension include: She was discharged to home on 12/15/20 without the need for home health services or durable medical equipment per the discharge summary.    Plan: This RNCM will attempt another outreach within 4 business days.    Valente David, [place], MSN [place] [phone]

## 2020-12-28 ENCOUNTER — Other Ambulatory Visit: Payer: Self-pay | Admitting: *Deleted

## 2020-12-28 ENCOUNTER — Other Ambulatory Visit: Payer: Self-pay

## 2020-12-28 ENCOUNTER — Encounter: Payer: Self-pay | Admitting: Dietician

## 2020-12-28 ENCOUNTER — Encounter: Payer: 59 | Attending: General Surgery | Admitting: Dietician

## 2020-12-28 VITALS — Ht 62.0 in | Wt 198.2 lb

## 2020-12-28 DIAGNOSIS — Z6835 Body mass index (BMI) 35.0-35.9, adult: Secondary | ICD-10-CM | POA: Insufficient documentation

## 2020-12-28 NOTE — Patient Instructions (Signed)
Great job meeting protein and fluid goals! Gradually introduce solid protein foods, making sure they are lean, moist, and tender. Add sugar free, low fat sauces as needed for moisture and flavor. Aim for 1-2oz of protein food at a time.  Add low carb vegetables only when able to consistently eat up to 2oz of protein food first. Continue to include or increase physical activity as cleared by MD.

## 2020-12-28 NOTE — Patient Outreach (Signed)
[place] [facility]) Care Management  12/28/2020  [name] [birth year] [id number]   Transition of care telephone call   Referral received:12/14/20 Initial outreach:12/16/20 Insurance: [place] attempt #3, unsuccessful telephone call to patient's preferred number in order to complete transition of care assessment; no answer, left HIPAA compliant voicemail message requesting return call.    Objective: Per the electronic medical record, [name]  was hospitalized at [facility] 11/14-11/15/22 for Laparoscopic gastric sleeve resection . PMHx includes: Obesity, hypertension include: She was discharged to home on 12/15/20 without the need for home health services or durable medical equipment per the discharge summary.    Plan: This RNCM will make 4th and final attempt within the next 4 weeks, if remain unsuccessful will close case due to inability to maintain contact.  [name], [place], MSN [place] [phone]

## 2020-12-28 NOTE — Progress Notes (Signed)
Nutrition Therapy for Post-Operative Bariatric Diet Follow-up visit:  2 weeks post-op Sleeve Gastrectomy Surgery  Medical Nutrition Therapy:  Appt start time: 0092 end time:  1400  Anthropometrics: InBody Body Composition results:  Date 10/07/20 11/23/20 11/28//22  BMI 38.57 38.59 36.2  Weight (lbs) 210.9 211.0 198.2  Skeletal muscle (lbs)   52.3  % body fat   51.6   Clinical: Medications: pantoprazole prn Supplementation: Bariatric Advantage multivitamin 2x daily; Calcium carbonate 3x daily Health/ medical history changes: no  GI symptoms: N/V/D/C: some constipation, less frequent BMs Dumping Syndrome: no Hair loss: no  Dietary/ Lifestyle Progress: Patient denies any significant issues or complications during or after surgery. She has had some mild constipation but no nausea or vomiting or diarrhea.  She is following the full liquid diet; did try pureed [place] with chicken broth for Thanksgiving and tolerated well.  She voices readiness to begin eating solid foods.   Dietary recall: Eating pattern: 2 protein shakes daily, variety of flavors; Gatorade with unflavored protein powder Dining out: n/a Breakfast: protein shake Snack: sugar free jello  Lunch: chicken broth Snack: fluids  Dinner: protein shake Snack: sugar free jello or sugar free popsicle  Fluid intake: water 17oz, diet cranberry juice, Gatorade zero 28oz, protein shakes 22oz, chicken broth 8oz, sf jello 4oz -- 70+oz daily Estimated total protein intake: 60-80g daily Bariatric diet adherence:  Using straws: no Drinking fluids during meals: n/a Carbonated beverages: no  Recent physical activity:  walking indoors, shopping   Nutrition Intervention:   Reviewed progress since having surgery. Instructed on advancing diet to include solid protein foods that are tender and moist. Discussed appropriate sauces to add for moisture.  Reviewed rule of avoiding fluids 15 minutes prior, during, and 30 minutes after  eating; importance of eating slowly and taking small bites; staying aware of fulness cues.  Advised monitoring protein and fluid intake, gradually reducing supplemental protein as intake of solid foods increases. Advised adding low carb solid vegetables after several more weeks, and only when able to consume at least 1-2oz protein first.    Nutritional Diagnosis:  [place]-3.3 Overweight/obesity As related to history of excess calories and inadequate physical activity.  As evidenced by patient with current BMI of 36, following bariatric diet guidelines for ongoing weight loss after bariatric surgery.  Teaching Method Utilized:  Visual Auditory Hands on  Materials provided: Phase 3 and 4 bariatric diet handouts Visit summary with goals/ instructions  Learning Readiness:  Change in progress  Barriers to learning/adherence to lifestyle change: none  Demonstrated degree of understanding via:  Teach Back      Plan: Begin adding solid protein foods into diet as outlined in patient goals. Return for next MNT visit on 02/12/21 at 1:30pm

## 2021-01-05 ENCOUNTER — Other Ambulatory Visit: Payer: Self-pay | Admitting: *Deleted

## 2021-01-05 NOTE — Patient Outreach (Addendum)
[name]) Care Management  01/05/2021  [name] [birth year] [id number]   Voice message received from member after missed calls.  Call placed back to member, both cell and work number, unsuccessful.  Will follow up within the next 2 weeks as planned.     Update @ 1155:   Transition of care telephone call   Referral received:12/14/20 Initial outreach:12/16/20 Insurance: [place]  Referral received: Initial outreach: Insurance: [place] [employer]/Save/Choice Plan   Subjective: Initial successful telephone call to patient's preferred number in order to complete transition of care assessment; 2 HIPAA identifiers verified. Explained purpose of call and completed transition of care assessment.  States she is doing well, denies post op problems, states surgical pain well managed with prescribed medications, tolerating  diet , denies bowel or bladder problems.  Spouse/sister are assisting  with her recovery.  Incisions are reported as healed, steri strips off.  She is working to adhere to daily fluid intake of 27ml daily, developing a daily routine as she will be returning to work on Monday 12/12.    Objective: Per the electronic medical record, [name]  was hospitalized at [facility] 11/14-11/15/22 for Laparoscopic gastric sleeve resection . PMHx includes: Obesity, hypertension include: She was discharged to home on 12/15/20 without the need for home health services or durable medical equipment per the discharge summary.    Assessment:  Patient voices good understanding of all discharge instructions. Will have surgical follow upon 12/8, has already followed up with nutrition specialist, next appointment in January.  Also started daily exercise routine, walking at least 30 minutes a day. See transition of care flowsheet for assessment details.   Plan:  Reviewed [facility] Active Health Management 2022 Wellness Requirements of: Completing  the computerized Health Assessment and the Health Action Step with Active Health Management [facility]) by October 01 2020 AND have an annual physical.  No ongoing care management needs identified so will close case to [place] [facility] care management services and route successful outreach letter with [place] Management pamphlet and 24 Hour Nurse Line Magnet to [place] [facility] clinical pool to be mailed to patient's home address.     Valente David, [place], MSN [place] [phone]

## 2021-01-18 ENCOUNTER — Ambulatory Visit: Payer: Self-pay | Admitting: *Deleted

## 2021-02-12 ENCOUNTER — Other Ambulatory Visit: Payer: Self-pay

## 2021-02-12 ENCOUNTER — Encounter: Payer: 59 | Attending: General Surgery | Admitting: Dietician

## 2021-02-12 ENCOUNTER — Encounter: Payer: Self-pay | Admitting: Dietician

## 2021-02-12 VITALS — Wt 186.5 lb

## 2021-02-12 DIAGNOSIS — E669 Obesity, unspecified: Secondary | ICD-10-CM | POA: Insufficient documentation

## 2021-02-12 DIAGNOSIS — Z6834 Body mass index (BMI) 34.0-34.9, adult: Secondary | ICD-10-CM | POA: Insufficient documentation

## 2021-02-12 NOTE — Progress Notes (Signed)
Nutrition Therapy for Post-Operative Bariatric Diet Follow-up visit:  2 months post-op sleeve gastrectomy surgery  Medical Nutrition Therapy:  Appt start time: 1330 end time:  1400.  Anthropometrics:  Date 10/07/20 11/23/20 11/28//22 02/12/21  BMI 38.57 38.59 36.2 34.11  Weight (lbs) 210.9 211.0 198.2 186.5  Skeletal muscle (lbs)     52.3 Pt declined  % body fat     51.6 Pt declined     Clinical: Medications: none Supplementation: bariatric multivitamin 2x daily, calcium carbonate 3x daily, biotin 1x daily Health/ medical history changes: recent URI GI symptoms: N/V/D/C: no nausea or vomiting, no diarrhea or constipation other than less frequent BMs Dumping Syndrome: no; 1 episode of cramping, urgent BM after eating highly seasoned food Hair loss: no  Dietary/ Lifestyle Progress: Patient reports growing weary of protein shakes, she tries to supplement solid protein foods with 1/2 - 1 shake daily but is having difficulty. She is tolerating solid foods well.  Exercise is increasing gradually, unable to do as much in the past 1-2 weeks due to URI.  Dietary recall: Eating pattern: 3 meals + 2 snacks daily Dining out:  Breakfast: 1 scrambled egg + 2 strips bacon; low sugar Greek yogurt; occ tuna Snack: 1/2 protein shake  Lunch: usually leftovers 1/12  2oz small beef patty + 2-4 Tbsp steamed brocc, cauli, carrots; grilled chicken breast, diced cucumber with Skinny Girl balsamic dressing Snack: pepperoni; slim jim sometimes sugar free jello Dinner: small portion steak/ meat cooked in air fryer + veg;  Snack: sugar free popsicle  Fluid intake: 2 bottles 26oz sugar free Gatorade, water with sugar free flavoring = 64+oz daily Estimated total protein intake: 60-70g daily Bariatric diet adherence:  Using straws: no Drinking fluids during meals: no Carbonated beverages: no  Recent physical activity:  walking on trail; joined [employer] but unable to begin visits due to URI for past 10  days.   Nutrition Intervention:   Reviewed progress since previous visit. Instructed on advancing diet to increase low carb veg, followed by high fiber veg and small portions of fruits in coming months.  Nutritional Diagnosis:  [place]-3.3 Overweight/obesity As related to history of excess calories and inadequate physical activity.  As evidenced by patient with current BMI of 34, following bariatric diet guidelines for ongoing weight loss after bariatric surgery.  Teaching Method Utilized:  Visual Auditory Hands on  Materials provided: Phase 5 and 6 bariatric diet handouts Visit summary with goals to be viewed via [place]:  Change in progress  Barriers to learning/adherence to lifestyle change: none  Demonstrated degree of understanding via:  Teach Back      Plan: Return for 6 month post-op MNT on 06/11/21 at 1:30pm

## 2021-02-12 NOTE — Patient Instructions (Signed)
Continue to gradually increase variety of foods, especially low carb veggies.  Begin adding peas, waxy potatoes/ sweet potato with skin, small portions of fruits (no added sugar). Try unflavored protein powder added to some foods or beverages to supplement solid protein if necessary.  Continue to gradually increase physical activity to maintain or build muscle weight while losing body fat.

## 2021-02-19 DIAGNOSIS — E569 Vitamin deficiency, unspecified: Secondary | ICD-10-CM | POA: Diagnosis not present

## 2021-02-19 DIAGNOSIS — E6609 Other obesity due to excess calories: Secondary | ICD-10-CM | POA: Diagnosis not present

## 2021-02-19 DIAGNOSIS — Z6834 Body mass index (BMI) 34.0-34.9, adult: Secondary | ICD-10-CM | POA: Diagnosis not present

## 2021-02-19 DIAGNOSIS — I10 Essential (primary) hypertension: Secondary | ICD-10-CM | POA: Diagnosis not present

## 2021-02-19 DIAGNOSIS — Z9884 Bariatric surgery status: Secondary | ICD-10-CM | POA: Diagnosis not present

## 2021-02-19 DIAGNOSIS — K912 Postsurgical malabsorption, not elsewhere classified: Secondary | ICD-10-CM | POA: Diagnosis not present

## 2021-02-19 LAB — BASIC METABOLIC PANEL
BUN: 16 (ref 4–21)
Chloride: 105 (ref 99–108)
Creatinine: 0.7 (ref 0.5–1.1)
Glucose: 111
Potassium: 4.3 (ref 3.4–5.3)
Sodium: 144 (ref 137–147)

## 2021-02-19 LAB — CBC AND DIFFERENTIAL
HCT: 46 (ref 36–46)
Hemoglobin: 15.4 (ref 12.0–16.0)
WBC: 9.1

## 2021-02-19 LAB — IRON,TIBC AND FERRITIN PANEL
%SAT: 15
Iron: 46
TIBC: 297
UIBC: 251

## 2021-02-19 LAB — TSH: TSH: 1.96 (ref 0.41–5.90)

## 2021-02-19 LAB — VITAMIN B12: Vitamin B-12: >2000

## 2021-02-19 LAB — CBC: RBC: 5.38 — AB (ref 3.87–5.11)

## 2021-02-19 LAB — VITAMIN D 25 HYDROXY (VIT D DEFICIENCY, FRACTURES): Vit D, 25-Hydroxy: 56.5

## 2021-03-01 ENCOUNTER — Ambulatory Visit: Payer: 59 | Admitting: Nurse Practitioner

## 2021-03-01 ENCOUNTER — Other Ambulatory Visit: Payer: Self-pay

## 2021-03-01 ENCOUNTER — Encounter: Payer: Self-pay | Admitting: Nurse Practitioner

## 2021-03-01 VITALS — BP 140/90 | HR 71 | Ht 62.0 in | Wt 183.0 lb

## 2021-03-01 DIAGNOSIS — Z6833 Body mass index (BMI) 33.0-33.9, adult: Secondary | ICD-10-CM | POA: Diagnosis not present

## 2021-03-01 DIAGNOSIS — R7303 Prediabetes: Secondary | ICD-10-CM | POA: Diagnosis not present

## 2021-03-01 NOTE — Progress Notes (Signed)
Subjective:    Patient ID: [name], female    DOB: 1965-10-14, 56 y.o.   MRN: [id number]  HPI: [name] is a 56 y.o. female presenting for follow up.  Chief Complaint  Patient presents with   Prediabetes   Follow-up   Since our last appointment, patient has had laparoscopic sleeve gastrectomy.  She reports she is doing well.  She is eating solid foods and staying more active.  She brought her most recent labs with her today.  She is worried that her Vitamin B12 is high.   PRE-DIABETES Hypoglycemic episodes:no Polydipsia/polyuria: no Visual disturbance: no Chest pain: no Paresthesias: no Glucose Monitoring: no Diabetic Education: Completed Physical activity: has increased - she is walking; she is walking on trail  Dietary habits: eating healthier  At [place] BP 120s/80s.  Does not regularly check blood pressure at home.  Allergies  Allergen Reactions   Lisinopril Cough    Outpatient Encounter Medications as of 03/01/2021  Medication Sig   Biotin 5 MG TABS Take by mouth.   calcium carbonate (TUMS - DOSED IN MG ELEMENTAL CALCIUM) 500 MG chewable tablet Chew 1 tablet by mouth 3 (three) times daily.   Multiple Vitamins-Minerals (MULTIVITAMIN ADULT) CHEW Chew by mouth. Bariatric Advantage 2x daily   ondansetron (ZOFRAN-ODT) 4 MG disintegrating tablet Take 1 tablet (4 mg total) by mouth every 6 (six) hours as needed for nausea or vomiting.   [DISCONTINUED] Multiple Vitamins-Minerals (CENTRUM ADULTS PO) Take 1 tablet by mouth daily. (Patient not taking: Reported on 02/12/2021)   [DISCONTINUED] pantoprazole (PROTONIX) 40 MG tablet Take 40 mg by mouth every morning.   No facility-administered encounter medications on file as of 03/01/2021.    Patient Active Problem List   Diagnosis Date Noted   Class II obesity 12/14/2020   Encounter for screening fecal occult blood testing 08/07/2020   Encounter for gynecological examination with Papanicolaou smear of  cervix 08/07/2020   Cervical polyp 08/07/2020   Peri-menopause 08/07/2020   Decreased libido 08/07/2020   Hot flashes 08/07/2020   Moody 08/07/2020   Generalized anxiety disorder 07/31/2020   BMI 37.0-37.9, adult 07/31/2020   Nutritional counseling 09/25/2017   Depression with anxiety 08/26/2015   Leiomyoma of uterus, unspecified 09/23/2013   Mixed stress and urge urinary incontinence 07/16/2012   Essential hypertension, benign 07/16/2012   Obesity 07/16/2012   Insomnia 07/16/2012   ESOPHAGEAL REFLUX 09/11/2007   GASTRITIS, ANTRAL 09/11/2007   HEADACHES, HX OF 09/11/2007    Past Medical History:  Diagnosis Date   Chronic gastritis    Complication of anesthesia    Depression    Duodenitis 08/2005   Dysphagia    Esophageal dilatation    GERD (gastroesophageal reflux disease)    Hypertension    PONV (postoperative nausea and vomiting)     Relevant past medical, surgical, family and social history reviewed and updated as indicated. Interim medical history since our last visit reviewed.  Review of Systems Per HPI unless specifically indicated above     Objective:    BP 140/90    Pulse 71    Ht 5\' 2"  (1.575 m)    Wt 183 lb (83 kg)    SpO2 98%    BMI 33.47 kg/m   Wt Readings from Last 3 Encounters:  03/01/21 183 lb (83 kg)  02/12/21 186 lb 8 oz (84.6 kg)  12/28/20 198 lb 3.2 oz (89.9 kg)    Physical Exam Vitals and nursing note reviewed.  Constitutional:  General: She is not in acute distress.    Appearance: Normal appearance. She is not toxic-appearing.  HENT:     Head: Normocephalic and atraumatic.  Cardiovascular:     Rate and Rhythm: Normal rate and regular rhythm.     Heart sounds: Normal heart sounds. No murmur heard. Pulmonary:     Effort: Pulmonary effort is normal. No respiratory distress.     Breath sounds: Normal breath sounds. No wheezing, rhonchi or rales.  Skin:    General: Skin is warm and dry.     Coloration: Skin is not jaundiced or pale.      Findings: No erythema.  Neurological:     Mental Status: She is alert and oriented to person, place, and time.     Motor: No weakness.     Gait: Gait normal.  Psychiatric:        Mood and Affect: Mood normal.        Behavior: Behavior normal.        Thought Content: Thought content normal.        Judgment: Judgment normal.      Assessment & Plan:  1. Prediabetes Check HgbA1c today.  Last HgbA1c 6.4% 2 months ago before surgery.  She has lost >20 lbs since then.  Encouraged continued hard work on dietary and lifestyle changes.   - Hemoglobin A1c  2. BMI 33.0-33.9,adult Congratulated on great success with dietary and lifestyle changes and weight loss.  Encouraged continued lifestyle modifications.       Follow up plan: Return for with new PCP.

## 2021-03-02 LAB — HEMOGLOBIN A1C
Hgb A1c MFr Bld: 5.3 % of total Hgb (ref <5.7)
Mean Plasma Glucose: 105 mg/dL
eAG (mmol/L): 5.8 mmol/L

## 2021-06-08 DIAGNOSIS — E6609 Other obesity due to excess calories: Secondary | ICD-10-CM | POA: Diagnosis not present

## 2021-06-08 DIAGNOSIS — Z6834 Body mass index (BMI) 34.0-34.9, adult: Secondary | ICD-10-CM | POA: Diagnosis not present

## 2021-06-08 DIAGNOSIS — Z9884 Bariatric surgery status: Secondary | ICD-10-CM | POA: Diagnosis not present

## 2021-06-08 DIAGNOSIS — E569 Vitamin deficiency, unspecified: Secondary | ICD-10-CM | POA: Diagnosis not present

## 2021-06-11 ENCOUNTER — Ambulatory Visit: Payer: 59 | Admitting: Dietician

## 2021-06-18 ENCOUNTER — Encounter: Payer: 59 | Attending: General Surgery | Admitting: Dietician

## 2021-06-18 ENCOUNTER — Encounter: Payer: Self-pay | Admitting: Dietician

## 2021-06-18 VITALS — Ht 62.0 in | Wt 170.4 lb

## 2021-06-18 DIAGNOSIS — Z713 Dietary counseling and surveillance: Secondary | ICD-10-CM

## 2021-06-18 NOTE — Patient Instructions (Signed)
Great job staying close to diet guidelines! Keep it up! Continue with regular exercise; consider adding strength building exercise to help with keeping a healthy metabolism.

## 2021-06-18 NOTE — Progress Notes (Signed)
Nutrition Therapy for Post-Operative Bariatric Diet Follow-up visit:  6 months post-op sleeve gastrectomy Surgery  Medical Nutrition Therapy:  Appt start time: 0815 end time:  0845  Anthropometrics:  Date 10/07/20 11/23/20 11/28//22 02/12/21 06/18/21  BMI 38.57 38.59 36.2 34.11   Weight (lbs) 210.9 211.0 198.2 186.5 170.4  Skeletal muscle (lbs)     52.3 Pt declined 50.3  % body fat     51.6 Pt declined 45.1   Clinical: Medications: no meds taken currently Supplementation: bariatric MVI 2x daily, calcium 3x daily, hair skin and nails supplement, collagen with [facility]/ medical history changes: no changes GI symptoms: N/V/D/C: none Dumping Syndrome: no Hair loss: yes  Dietary/ Lifestyle Progress: Patient reports some periodic weight loss plateaus, short-term. She understands this is a normal part of the weight loss journey, and continues to follow the bariatric diet guidelines.  Muscle mass has increased since previous measurement.  She has recently been experiencing some hair loss, so started an additional nutrition supplement.   Dietary recall: Eating pattern: 3 meals and 2 snacks daily Dining out: 0-1 Breakfast: usually protein shake 30g; weekends 1 egg and 1 sausage patty; rarely homemade fruit salad with apple, blueberries, mand oranges and pineapple Snack: yellow bell pepper with ranch dressing; quest protein chips (1/2 bag)  Lunch: salad with hamburger; tuna with side of pickles; rotisserie chicken Snack: same as am  Dinner: chicken wings air fried/ baked haddock/ pork chop with side of green beans/ zucchini/ peas Snack: sf popsicle  Fluid intake: using recipes found online -- skinny flavorings and mix-in packets with water 64+ oz daily per patient Estimated total protein intake: 60-70g Bariatric diet adherence:  Using straws: occasionally Drinking fluids during meals: no Carbonated beverages: no  Recent physical activity:  walking 3 miles, 3 times per week   Nutrition  Intervention:   Reviewed progress since previous visit Discussed importance of maintaining current nutritional balance with emphasis on lean proteins, low carb vegetables.  Discussed role of physical activity, including strength building exercise.  Discussed importance of ongoing portion control including when reaching weight loss goal.  Nutritional Diagnosis:  [place]-3.3 Overweight/obesity As related to history of  excess calories and inadequate physical activity.  As evidenced by patient with current BMI of 31.2, following bariatric diet guidelines for ongoing weight loss after bariatric surgery.  Teaching Method Utilized:  Visual Auditory Hands on  Materials provided: Phase 7 bariatric diet handout  Learning Readiness:  Change in progress  Barriers to learning/adherence to lifestyle change: none  Demonstrated degree of understanding via:  Teach Back      Plan: Return for MNT follow up at 9 months post-op: to be scheduled

## 2021-07-16 DIAGNOSIS — Z0000 Encounter for general adult medical examination without abnormal findings: Secondary | ICD-10-CM | POA: Diagnosis not present

## 2021-07-16 DIAGNOSIS — E559 Vitamin D deficiency, unspecified: Secondary | ICD-10-CM | POA: Diagnosis not present

## 2021-07-16 DIAGNOSIS — N959 Unspecified menopausal and perimenopausal disorder: Secondary | ICD-10-CM | POA: Diagnosis not present

## 2021-07-16 DIAGNOSIS — Z136 Encounter for screening for cardiovascular disorders: Secondary | ICD-10-CM | POA: Diagnosis not present

## 2021-07-16 DIAGNOSIS — Z13220 Encounter for screening for lipoid disorders: Secondary | ICD-10-CM | POA: Diagnosis not present

## 2021-07-16 DIAGNOSIS — Z9884 Bariatric surgery status: Secondary | ICD-10-CM | POA: Diagnosis not present

## 2021-07-16 DIAGNOSIS — Z1231 Encounter for screening mammogram for malignant neoplasm of breast: Secondary | ICD-10-CM | POA: Diagnosis not present

## 2021-08-26 ENCOUNTER — Other Ambulatory Visit (HOSPITAL_COMMUNITY): Payer: Self-pay | Admitting: Adult Health Nurse Practitioner

## 2021-08-26 ENCOUNTER — Other Ambulatory Visit: Payer: Self-pay | Admitting: Adult Health Nurse Practitioner

## 2021-08-26 DIAGNOSIS — N946 Dysmenorrhea, unspecified: Secondary | ICD-10-CM

## 2021-09-17 ENCOUNTER — Ambulatory Visit: Payer: 59 | Admitting: Dietician

## 2021-09-17 ENCOUNTER — Ambulatory Visit (HOSPITAL_COMMUNITY)
Admission: RE | Admit: 2021-09-17 | Discharge: 2021-09-17 | Disposition: A | Discharge status: Home / Self Care | Payer: 59 | Source: Ambulatory Visit | Attending: Adult Health Nurse Practitioner | Admitting: Adult Health Nurse Practitioner

## 2021-09-17 DIAGNOSIS — D251 Intramural leiomyoma of uterus: Secondary | ICD-10-CM | POA: Diagnosis not present

## 2021-09-17 DIAGNOSIS — N946 Dysmenorrhea, unspecified: Secondary | ICD-10-CM | POA: Diagnosis not present

## 2021-09-17 DIAGNOSIS — D252 Subserosal leiomyoma of uterus: Secondary | ICD-10-CM | POA: Diagnosis not present

## 2021-09-23 ENCOUNTER — Telehealth: Payer: Self-pay | Admitting: Obstetrics & Gynecology

## 2021-09-23 NOTE — Telephone Encounter (Signed)
Patient would like you to look at her ultrasound she had done on 8/18. Please advise.

## 2021-10-05 ENCOUNTER — Ambulatory Visit
Admission: EM | Admit: 2021-10-05 | Discharge: 2021-10-05 | Disposition: A | Discharge status: Home / Self Care | Payer: 59 | Attending: Family Medicine | Admitting: Family Medicine

## 2021-10-05 DIAGNOSIS — Z0189 Encounter for other specified special examinations: Secondary | ICD-10-CM | POA: Diagnosis not present

## 2021-10-05 DIAGNOSIS — J069 Acute upper respiratory infection, unspecified: Secondary | ICD-10-CM | POA: Insufficient documentation

## 2021-10-05 DIAGNOSIS — Z20822 Contact with and (suspected) exposure to covid-19: Secondary | ICD-10-CM | POA: Diagnosis not present

## 2021-10-05 LAB — RESP PANEL BY RT-PCR (FLU A&B, COVID) ARPGX2
Influenza A by PCR: NEGATIVE
Influenza B by PCR: NEGATIVE
SARS Coronavirus 2 by RT PCR: NEGATIVE

## 2021-10-05 LAB — POCT RAPID STREP A (OFFICE): Rapid Strep A Screen: NEGATIVE

## 2021-10-05 MED ORDER — MOLNUPIRAVIR EUA 200MG CAPSULE: 4.0000 | ORAL_CAPSULE | Freq: Two times a day (BID) | ORAL | 0 refills | Status: AC

## 2021-10-05 NOTE — ED Provider Notes (Signed)
[facility]    CSN: [id number] Arrival date & time: 10/05/21  0834      History   Chief Complaint Chief Complaint  Patient presents with   Fever   Sore Throat        Generalized Body Aches         HPI [name] is a 56 y.o. female.   Presenting today with 1 day history of high fever, sore throat, body aches, congestion, sinus pressure.  Denies chest pain, shortness of breath, abdominal pain, nausea vomiting or diarrhea.  So far not trying any medications over-the-counter for symptoms.  Multiple sick contacts recently, works in healthcare.  No known history of chronic pulmonary disease.    Past Medical History:  Diagnosis Date   Chronic gastritis    Complication of anesthesia    Depression    Duodenitis 08/2005   Dysphagia    Esophageal dilatation    GERD (gastroesophageal reflux disease)    Hypertension    PONV (postoperative nausea and vomiting)     Patient Active Problem List   Diagnosis Date Noted   Class II obesity 12/14/2020   Encounter for screening fecal occult blood testing 08/07/2020   Encounter for gynecological examination with Papanicolaou smear of cervix 08/07/2020   Cervical polyp 08/07/2020   Peri-menopause 08/07/2020   Decreased libido 08/07/2020   Hot flashes 08/07/2020   Moody 08/07/2020   Generalized anxiety disorder 07/31/2020   BMI 37.0-37.9, adult 07/31/2020   Nutritional counseling 09/25/2017   Depression with anxiety 08/26/2015   Leiomyoma of uterus, unspecified 09/23/2013   Mixed stress and urge urinary incontinence 07/16/2012   Essential hypertension, benign 07/16/2012   Obesity 07/16/2012   Insomnia 07/16/2012   ESOPHAGEAL REFLUX 09/11/2007   GASTRITIS, ANTRAL 09/11/2007   HEADACHES, HX OF 09/11/2007    Past Surgical History:  Procedure Laterality Date   BIOPSY  07/24/2020   Procedure: BIOPSY;  Surgeon: Daneil Dolin, MD;  [facility];  Service: Endoscopy;;   BREAST BIOPSY Right 04/26/2013    Procedure: BREAST BIOPSY TIMES TWO;  Surgeon: Jamesetta So, MD;  Location: [facility];  Service: General;  Laterality: Right;   BREAST MASS EXCISION     bilateral tumor removal   COLONOSCOPY N/A 01/27/2016   Procedure: COLONOSCOPY;  Surgeon: Danie Binder, MD;  [facility];  Service: Endoscopy;  Laterality: N/A;  [place] N/A 10/11/2013   Procedure: DILATATION & CURETTAGE/HYSTEROSCOPY WITH THERMACHOICE ABLATION;  Surgeon: Florian Buff, MD;  Location: [facility];  Service: Gynecology;  Laterality: N/A;  total therapy time- 20 minutes temp  76 deg C   ESOPHAGEAL DILATION  08/2005   ESOPHAGOGASTRODUODENOSCOPY N/A 01/27/2016   Procedure: ESOPHAGOGASTRODUODENOSCOPY (EGD);  Surgeon: Danie Binder, MD;  [facility];  Service: Endoscopy;  Laterality: N/A;   ESOPHAGOGASTRODUODENOSCOPY (EGD) WITH PROPOFOL N/A 07/24/2020   Procedure: ESOPHAGOGASTRODUODENOSCOPY (EGD) WITH PROPOFOL;  Surgeon: Daneil Dolin, MD;  [facility];  Service: Endoscopy;  Laterality: N/A;  12:30pm   HIATAL HERNIA REPAIR N/A 12/14/2020   Procedure: HERNIA REPAIR HIATAL;  Surgeon: Kinsinger, Arta Bruce, MD;  Location: [facility];  Service: General;  Laterality: N/A;   LAPAROSCOPIC GASTRIC SLEEVE RESECTION N/A 12/14/2020   Procedure: LAPAROSCOPIC GASTRIC SLEEVE RESECTION;  Surgeon: Kieth Brightly Arta Bruce, MD;  Location: [facility];  Service: General;  Laterality: N/A;   MALONEY DILATION N/A 07/24/2020   Procedure: Venia Minks DILATION;  Surgeon: Gala Romney,  Cristopher Estimable, MD;  [facility];  Service: Endoscopy;  Laterality: N/A;   POLYPECTOMY  07/24/2020   Procedure: POLYPECTOMY;  Surgeon: Daneil Dolin, MD;  [facility];  Service: Endoscopy;;   SAVORY DILATION N/A 01/27/2016   Procedure: SAVORY DILATION;  Surgeon: Danie Binder, MD;  [facility];  Service: Endoscopy;  Laterality: N/A;   TUBAL LIGATION     UPPER GI ENDOSCOPY N/A  12/14/2020   Procedure: UPPER GI ENDOSCOPY;  Surgeon: Kieth Brightly, Arta Bruce, MD;  Location: [facility];  Service: General;  Laterality: N/A;   WISDOM TOOTH EXTRACTION      OB History     Gravida  2   Para  2   Term  2   Preterm      AB      Living  2      SAB      IAB      Ectopic      Multiple      Live Births  2            Home Medications    Prior to Admission medications   Medication Sig Start Date End Date Taking? Authorizing Provider  Cholecalciferol 125 MCG (5000 UT) capsule  03/31/21  Yes [provider]  molnupiravir EUA (LAGEVRIO) 200 mg CAPS capsule Take 4 capsules (800 mg total) by mouth 2 (two) times daily for 5 days. 10/05/21 10/10/21 Yes Volney American, PA-C  calcium carbonate (TUMS - DOSED IN MG ELEMENTAL CALCIUM) 500 MG chewable tablet Chew 1 tablet by mouth 3 (three) times daily.    [provider]  COLLAGEN PO Take by mouth. With vitamin D    [provider]  Multiple Vitamins-Minerals (MULTIVITAMIN ADULT) CHEW Chew by mouth. Bariatric Advantage 2x daily    [provider]  Multiple Vitamins-Minerals (MULTIVITAMIN WOMEN PO) Take by mouth. Hair skin and nails formula with biotin and D    [provider]  ondansetron (ZOFRAN-ODT) 4 MG disintegrating tablet Take 1 tablet (4 mg total) by mouth every 6 (six) hours as needed for nausea or vomiting. 12/15/20   Kinsinger, Arta Bruce, MD    Family History Family History  Problem Relation Age of Onset   Hypertension Father    Stroke Father    Heart disease Father    Heart disease Mother    Cancer Paternal Aunt        colon   Cancer Paternal Uncle        colon   Cancer Cousin        colon    Social History Social History   Tobacco Use   Smoking status: Never   Smokeless tobacco: Never  Vaping Use   Vaping Use: Never used  Substance Use Topics   Alcohol use: No   Drug use: No     Allergies   Lisinopril   Review of Systems Review of  Systems PER HPI  Physical Exam Triage Vital Signs ED Triage Vitals  Enc Vitals Group     BP 10/05/21 1007 124/84     Pulse Rate 10/05/21 1007 (!) 102     Resp 10/05/21 1007 16     Temp 10/05/21 1007 98.1 F (36.7 C)     Temp Source 10/05/21 1007 Oral     SpO2 10/05/21 1007 95 %     Weight --      Height --      Head Circumference --  Peak Flow --      Pain Score 10/05/21 1009 5     Pain Loc --      Pain Edu? --      Excl. in [place]? --    No data found.  Updated Vital Signs BP 124/84 (BP Location: Right Arm)   Pulse (!) 102   Temp 98.1 F (36.7 C) (Oral)   Resp 16   SpO2 95%   Visual Acuity Right Eye Distance:   Left Eye Distance:   Bilateral Distance:    Right Eye Near:   Left Eye Near:    Bilateral Near:     Physical Exam Vitals and nursing note reviewed.  Constitutional:      Appearance: Normal appearance.  HENT:     Head: Atraumatic.     Right Ear: Tympanic membrane and external ear normal.     Left Ear: Tympanic membrane and external ear normal.     Nose: Rhinorrhea present.     Mouth/Throat:     Mouth: Mucous membranes are moist.     Pharynx: Posterior oropharyngeal erythema present. No oropharyngeal exudate.  Eyes:     Extraocular Movements: Extraocular movements intact.     Conjunctiva/sclera: Conjunctivae normal.  Cardiovascular:     Rate and Rhythm: Normal rate and regular rhythm.     Heart sounds: Normal heart sounds.  Pulmonary:     Effort: Pulmonary effort is normal.     Breath sounds: Normal breath sounds. No wheezing.  Musculoskeletal:        General: Normal range of motion.     Cervical back: Normal range of motion and neck supple.  Skin:    General: Skin is warm and dry.  Neurological:     Mental Status: She is alert and oriented to person, place, and time.  Psychiatric:        Mood and Affect: Mood normal.        Thought Content: Thought content normal.    UC Treatments / Results  Labs (all labs ordered are listed, but  only abnormal results are displayed) Labs Reviewed  SARS CORONAVIRUS 2 (TAT 6-24 HRS)  RESP PANEL BY RT-PCR (FLU A&B, COVID) [id number]  POCT RAPID STREP A (OFFICE)   EKG  Radiology No results found.  Procedures Procedures (including critical care time)  Medications Ordered in UC Medications - No data to display  Initial Impression / Assessment and Plan / UC Course  I have reviewed the triage vital signs and the nursing notes.  Pertinent labs & imaging results that were available during my care of the patient were reviewed by me and considered in my medical decision making (see chart for details).     Highly suspicious for COVID-19 infection, respiratory panel pending, rapid strep negative.  We will start molnupiravir given suspicion, await test results.  Discussed supportive medications and home care.  Return for worsening symptoms.  Work note given.  Final Clinical Impressions(s) / UC Diagnoses   Final diagnoses:  Patient requested diagnostic testing  Viral URI with cough   Discharge Instructions   None    ED Prescriptions     Medication Sig Dispense Auth. Provider   molnupiravir EUA (LAGEVRIO) 200 mg CAPS capsule Take 4 capsules (800 mg total) by mouth 2 (two) times daily for 5 days. 40 capsule Volney American, [place]      PDMP not reviewed this encounter.   Volney American, [place] 10/05/21 1054

## 2021-10-05 NOTE — ED Triage Notes (Signed)
Pt repost fever 101.5 F, sore throat and body aches x 1 day. Pt has not taken any medications for complaints.   Pt reports she had bariatric surgery in November 2022 and she is not sure which mediations she can take.

## 2021-10-15 ENCOUNTER — Ambulatory Visit: Payer: 59 | Admitting: Obstetrics & Gynecology

## 2021-10-15 ENCOUNTER — Encounter: Payer: Self-pay | Admitting: Obstetrics & Gynecology

## 2021-10-15 VITALS — BP 115/74 | HR 82 | Ht 63.0 in | Wt 166.8 lb

## 2021-10-15 DIAGNOSIS — D252 Subserosal leiomyoma of uterus: Secondary | ICD-10-CM | POA: Diagnosis not present

## 2021-10-15 DIAGNOSIS — Z1231 Encounter for screening mammogram for malignant neoplasm of breast: Secondary | ICD-10-CM | POA: Diagnosis not present

## 2021-10-15 DIAGNOSIS — E280 Estrogen excess: Secondary | ICD-10-CM | POA: Diagnosis not present

## 2021-10-15 DIAGNOSIS — N951 Menopausal and female climacteric states: Secondary | ICD-10-CM | POA: Diagnosis not present

## 2021-10-15 NOTE — Progress Notes (Signed)
GYN VISIT Patient name: [name] MRN [id number]  Date of birth: [birth year] Chief Complaint:   Follow-up (On recent ultrasound results. Discuss treatment. )  History of Present Illness:   [name] is a 56 y.o. [phone] female being seen today for follow up from her PCP.    Recently transitioned to a new PCP who completed some lab work.  Per patient, she was told that she had elevated estrogen.  Due to that, she was scheduled for an ultrasound and was then told she needed to see [place] to discuss a hysterectomy.  Pt is currently peri-menopausal.  Last period was June 2023 that lasted for about 3 days.  She has not yet gone a year without a period.  Denies HMB since having the ablation.  Denies pelvic or abdominal pain, but does note some low back pain.  Denies acute gyn concern.  [place] reviewed- completed 08/2021: Enlarged uterus 458m with multiple fibroids- largest ~6cm.  Normal ovaries bilaterally- dominant left follicle 21.6WF109.3x 7.7 x 8.7 cm = volume: 395.5 mL. Uterus is retroverted with multiple fibroids seen as follows;  1. 3.7 x 4.0 x 4.1 cm intramural fibroid present at the right anterior uterine body. 2. 5.2 x 6.7 x 5.6 cm intramural fibroid centered at the posterior uterine fundus. 3. 4.7 x 3.5 x 4.3 cm intramural to subserosal fibroid present at the left posterior uterine body. 4. 1.9 x 1.9 x 2.3 cm intramural fibroid present at the mid uterine body.  Reviewed lab work- FSH/LH c/w reproductive age female.  Estrogen elevated  Patient has had an ablation.  LMP in June     10/07/2020    2:54 PM 08/28/2020    2:45 PM 08/07/2020   12:00 PM 07/29/2020    3:41 PM 07/29/2019    3:03 PM  Depression screen PHQ 2/9  Decreased Interest 0 0 0 1 0  Down, Depressed, Hopeless 0 0 0 0 0  PHQ - 2 Score 0 0 0 1 0  Altered sleeping  0 0 3 0  Tired, decreased energy  '1 2 3 '$ 0  Change in appetite  0 1 2 0  Feeling bad or failure about yourself   0 0 2 0  Trouble concentrating  0 0 2 0   Moving slowly or fidgety/restless  0 0 1 0  Suicidal thoughts  0 0 0 0  PHQ-9 Score  '1 3 14 '$ 0  Difficult doing work/chores  Not difficult at all  Somewhat difficult Not difficult at all     Review of Systems:   Pertinent items are noted in HPI Denies fever/chills, dizziness, headaches, visual disturbances, fatigue, shortness of breath, chest pain, abdominal pain, vomiting, no problems with periods, bowel movements, urination, or intercourse unless otherwise stated above.  Pertinent History Reviewed:  Reviewed past medical,surgical, social, obstetrical and family history.  Reviewed problem list, medications and allergies. Physical Assessment:   Vitals:   10/15/21 1208  BP: 115/74  Pulse: 82  Weight: 166 lb 12.8 oz (75.7 kg)  Height: '5\' 3"'$  (1.6 m)  Body mass index is 29.55 kg/m.       Physical Examination:   General appearance: alert, well appearing, and in no distress  Psych: mood appropriate, normal affect  Skin: warm & dry   Cardiovascular: normal heart rate noted  Respiratory: normal respiratory effort, no distress  Abdomen: soft, non-tender, no rebound, no guarding, uterus well below umbilicus  Pelvic: examination not indicated  Extremities: no edema  Chaperone: N/A    Assessment & Plan:  1) Uterine fibroids -pt is not reporting pelvic pain or AUB -surgical intervention not indicated at this time -reviewed definition of menopause -Reassured pt that fibroids are a benign normal finding and in review of her records were noted at her prior US in 2015.  While they are slightly bigger now that is to be expected while women are still having menses.    -Discussed that typically menopause is not defined by lab work, but rather the time frame of year without menses.  After the 61yrmark should bleeding return, would advise further work up at that time.   -In terms of the elevated estrogen, I do not think this is pathologic.  While it could be due to a carcinoma- her ovaries  were normal on ultrasound.  Estrogen elevation may also be due to medication; however there was nothing on her medication list that would explain this level.   As mentioned, from a clinical standpoint she is not having any acute gynecologic concern and therefore, I do no think any further gynecologic work up is indicated.  As discussed estrogen levels are not a typical lab work that would be ordered to evaluate "menopausal status" if indeed that was the initial intent of the lab work.  Questions and concerns were addressed with patient Plan to follow up for routine annual exams Mammogram ordered for this year   JJanyth Pupa DO Attending [place] [facility] [place]

## 2021-11-05 ENCOUNTER — Ambulatory Visit: Payer: 59 | Admitting: Adult Health

## 2021-12-15 ENCOUNTER — Encounter: Payer: 59 | Attending: Adult Health | Admitting: Dietician

## 2021-12-15 ENCOUNTER — Encounter: Payer: Self-pay | Admitting: Dietician

## 2021-12-15 VITALS — Ht 62.0 in | Wt 168.2 lb

## 2021-12-15 DIAGNOSIS — E669 Obesity, unspecified: Secondary | ICD-10-CM | POA: Diagnosis not present

## 2021-12-15 DIAGNOSIS — Z6830 Body mass index (BMI) 30.0-30.9, adult: Secondary | ICD-10-CM | POA: Diagnosis not present

## 2021-12-15 DIAGNOSIS — Z9884 Bariatric surgery status: Secondary | ICD-10-CM | POA: Insufficient documentation

## 2021-12-15 NOTE — Patient Instructions (Signed)
Try tracking food intake for a week or more using an app like baritastic or MyFitnessPal to monitor caloric intake.  Keep up regular exercise; goal is at least 150 minutes each week. Consider adding some strength building exercises 2-3 times a week.  Drink plenty of water daily for energy and preventing fluid retention.

## 2021-12-15 NOTE — Progress Notes (Signed)
Nutrition Therapy for Post-Operative Bariatric Diet Follow-up visit:  12 months post-op sleeve gastrectomy surgery  Medical Nutrition Therapy:  Appt start time: 0820 end time:  0910  Anthropometrics:  Date 10/07/20 11/23/20 11/28//22 02/12/21 06/18/21 12/15/21  BMI 38.57 38.59 36.2 34.11   30.76  Weight (lbs) 210.9 211.0 198.2 186.5 170.4 168.2  Skeletal muscle (lbs)     52.3 Pt declined 50.3 50.0  % body fat     51.6 Pt declined 45.1 44.7   Clinical: Medications: none taken at this time, only taking supplements Supplementation: vitafusion gummies 3 daily in am,  vitamin D3 137mg 2 daily, vitamin C '100mg'$  daily; Calcium '1000mg'$  3x daily Health/ medical history changes: none GI symptoms: N/V/D/C: no Dumping Syndrome: no Hair loss: none recently  Dietary/ Lifestyle Progress: Patient reports weight loss slowing/ plateau. Weight checks at home are at 162-164 most days.  She reports increased stress in recent weeks due to son's wedding (interaction with ex husband); wedding now over and stress is improving. Exercise has decreased somewhat as husband has stopped exercising. Food portions and choices have not changed much since previous visit (06/18/21)  Dietary recall: Eating pattern: 3 meals and 2-3 snacks daily Dining out: 1-2 times a week, mostly breakfast -- orders 1 sausage patty or bacon Breakfast: protein shake + apple or light fit yogurt or bacon; egg salad with cottage cheese Snack: bell pepper with ranch dressing; protein chips  Lunch: salad with hamburger; tuna with pickles; chicken Snack: protein chips; skinny pop popcorn occasionally; built protein bar  Dinner: chicken/ fish/ pork chop + green beans/ peas/ asparagus/ occ cantaloupe Snack: sugar free popsicle; protein chips; small portion nuts; slim jim (small)  Fluid intake: water with flavor packets 20-60oz daily Estimated total protein intake: 60-70g Bariatric diet adherence:  Using straws: occasionally Drinking fluids  during meals: no other than occasional sips Carbonated beverages: no  Recent physical activity:  walking 1 mile at lunch or after work + 1.5 - 2 miles at gym 2-3 times a week. Total at least 1 mile 3-4 times a week   Nutrition Intervention:   Reviewed progress since previous visit Discussed resuming food tracking for a period of time Discussed goals and options for exercise and effects on metabolism Determined caloric needs based on body composition analysis estimating BMR at 1297kcal. Provided guidance and sample bariatric menus for 1000-1200kcal Updated goals with input from patient  Nutritional Diagnosis:  Hazen-3.3 Overweight/obesity As related to history of  excess calories and inadequate physical activity.  As evidenced by patient with current BMI of 30.76, following bariatric diet guidelines for ongoing weight loss after bariatric surgery.   Teaching Method Utilized:  Visual Auditory Hands on  Materials provided: Bariatric Menus for 1000-1200 calories (AND) Visit summary with goals/ instructions  Learning Readiness:  Change in progress  Barriers to learning/adherence to lifestyle change: none  Demonstrated degree of understanding via:  Teach Back      Plan: Work on goals as established Return in 3 months (03/2022) for MNT follow up

## 2022-03-11 ENCOUNTER — Ambulatory Visit: Payer: Self-pay | Admitting: Dietician

## 2022-03-31 ENCOUNTER — Encounter: Payer: Self-pay | Admitting: Radiology

## 2022-04-07 ENCOUNTER — Other Ambulatory Visit (HOSPITAL_COMMUNITY): Payer: Self-pay | Admitting: Adult Health Nurse Practitioner

## 2022-04-07 DIAGNOSIS — Z1231 Encounter for screening mammogram for malignant neoplasm of breast: Secondary | ICD-10-CM

## 2022-04-18 ENCOUNTER — Inpatient Hospital Stay (HOSPITAL_COMMUNITY): Admission: RE | Admit: 2022-04-18 | Payer: Commercial Managed Care - PPO | Source: Ambulatory Visit

## 2022-04-18 ENCOUNTER — Ambulatory Visit (HOSPITAL_COMMUNITY)
Admission: RE | Admit: 2022-04-18 | Discharge: 2022-04-18 | Disposition: A | Discharge status: Home / Self Care | Payer: Commercial Managed Care - PPO | Source: Ambulatory Visit | Attending: Adult Health Nurse Practitioner | Admitting: Adult Health Nurse Practitioner

## 2022-04-18 DIAGNOSIS — Z1231 Encounter for screening mammogram for malignant neoplasm of breast: Secondary | ICD-10-CM

## 2022-06-14 DIAGNOSIS — Z6831 Body mass index (BMI) 31.0-31.9, adult: Secondary | ICD-10-CM | POA: Diagnosis not present

## 2022-06-14 DIAGNOSIS — J209 Acute bronchitis, unspecified: Secondary | ICD-10-CM | POA: Diagnosis not present

## 2022-06-14 DIAGNOSIS — R030 Elevated blood-pressure reading, without diagnosis of hypertension: Secondary | ICD-10-CM | POA: Diagnosis not present

## 2022-06-14 DIAGNOSIS — E669 Obesity, unspecified: Secondary | ICD-10-CM | POA: Diagnosis not present

## 2022-06-15 ENCOUNTER — Telehealth: Payer: Commercial Managed Care - PPO | Admitting: Physician Assistant

## 2022-06-15 DIAGNOSIS — J069 Acute upper respiratory infection, unspecified: Secondary | ICD-10-CM | POA: Diagnosis not present

## 2022-06-15 DIAGNOSIS — B9689 Other specified bacterial agents as the cause of diseases classified elsewhere: Secondary | ICD-10-CM | POA: Diagnosis not present

## 2022-06-15 MED ORDER — AMOXICILLIN-POT CLAVULANATE 875-125 MG PO TABS: 1.0000 | ORAL_TABLET | Freq: Two times a day (BID) | ORAL | 0 refills | Status: DC

## 2022-06-15 NOTE — Progress Notes (Signed)
E-Visit for Upper Respiratory Infection   We are sorry you are not feeling well.  Here is how we plan to help!  Based on what you have shared with me, it looks like you may have a bacterial upper respiratory infection.  Symptoms vary from person to person, with common symptoms including sore throat, cough, fatigue or lack of energy and feeling of general discomfort. Fevers over 101 can occur. Symptoms vary however, and are closely related to a person's age or underlying illnesses.  The most common symptoms associated with an upper respiratory infection are nasal discharge or congestion, cough, sneezing, headache and pressure in the ears and face.  These symptoms usually persist for about 3 to 10 days, but can last up to 2 weeks.  It is important to know that upper respiratory infections do not cause serious illness or complications in most cases.    Upper respiratory infections can be transmitted from person to person, with the most common method of transmission being a person's hands.  Also, these can be transmitted when someone coughs or sneezes; thus, it is important to cover the mouth to reduce this risk.  To keep the spread of the illness at bay, good hand hygiene is very important.  I have prescribed Augmentin 875-125mg  Take 1 tablet twice daily for 10 days.  For nasal congestion, you may use an oral decongestants such as Mucinex D or if you have glaucoma or high blood pressure use plain Mucinex.  Saline nasal spray or nasal drops can help and can safely be used as often as needed for congestion.  For your congestion, I have prescribed Ipratropium Bromide nasal spray 0.03% two sprays in each nostril 2-3 times a day  If you do not have a history of heart disease, hypertension, diabetes or thyroid disease, prostate/bladder issues or glaucoma, you may also use Sudafed to treat nasal congestion.  It is highly recommended that you consult with a pharmacist or your primary care physician to ensure this  medication is safe for you to take.     If you have a cough, you may use cough suppressants such as Delsym and Robitussin.  If you have glaucoma or high blood pressure, you can also use Coricidin HBP.    If you have a sore or scratchy throat, use a saltwater gargle-  to  teaspoon of salt dissolved in a 4-ounce to 8-ounce glass of warm water.  Gargle the solution for approximately 15-30 seconds and then spit.  It is important not to swallow the solution.  You can also use throat lozenges/cough drops and Chloraseptic spray to help with throat pain or discomfort.  Warm or cold liquids can also be helpful in relieving throat pain.  For headache, pain or general discomfort, you can use Ibuprofen or Tylenol as directed.   Some authorities believe that zinc sprays or the use of Echinacea may shorten the course of your symptoms.   HOME CARE Only take medications as instructed by your medical team. Be sure to drink plenty of fluids. Water is fine as well as fruit juices, sodas and electrolyte beverages. You may want to stay away from caffeine or alcohol. If you are nauseated, try taking small sips of liquids. How do you know if you are getting enough fluid? Your urine should be a pale yellow or almost colorless. Get rest. Taking a steamy shower or using a humidifier may help nasal congestion and ease sore throat pain. You can place a towel over your head  and breathe in the steam from hot water coming from a faucet. Using a saline nasal spray works much the same way. Cough drops, hard candies and sore throat lozenges may ease your cough. Avoid close contacts especially the very young and the elderly Cover your mouth if you cough or sneeze Always remember to wash your hands.   GET HELP RIGHT AWAY IF: You develop worsening fever. If your symptoms do not improve within 10 days You develop yellow or green discharge from your nose over 3 days. You have coughing fits You develop a severe head ache or  visual changes. You develop shortness of breath, difficulty breathing or start having chest pain Your symptoms persist after you have completed your treatment plan  MAKE SURE YOU  Understand these instructions. Will watch your condition. Will get help right away if you are not doing well or get worse.  Thank you for choosing an e-visit.  Your e-visit answers were reviewed by a board certified advanced clinical practitioner to complete your personal care plan. Depending upon the condition, your plan could have included both over the counter or prescription medications.  Please review your pharmacy choice. Make sure the pharmacy is open so you can pick up prescription now. If there is a problem, you may contact your provider through [employer] and have the prescription routed to another pharmacy.  Your safety is important to [place]. If you have drug allergies check your prescription carefully.   For the next 24 hours you can use MyChart to ask questions about today's visit, request a non-urgent call back, or ask for a work or school excuse. You will get an email in the next two days asking about your experience. I hope that your e-visit has been valuable and will speed your recovery.   I have spent 5 minutes in review of e-visit questionnaire, review and updating patient chart, medical decision making and response to patient.   Margaretann Loveless, PA-C

## 2022-08-01 ENCOUNTER — Encounter (HOSPITAL_COMMUNITY): Payer: Self-pay | Admitting: *Deleted

## 2022-12-13 ENCOUNTER — Ambulatory Visit: Payer: Commercial Managed Care - PPO | Admitting: Adult Health

## 2022-12-13 ENCOUNTER — Encounter: Payer: Self-pay | Admitting: Adult Health

## 2022-12-13 VITALS — BP 133/87 | HR 80 | Ht 63.0 in | Wt 187.0 lb

## 2022-12-13 DIAGNOSIS — Z780 Asymptomatic menopausal state: Secondary | ICD-10-CM

## 2022-12-13 DIAGNOSIS — Z01419 Encounter for gynecological examination (general) (routine) without abnormal findings: Secondary | ICD-10-CM | POA: Diagnosis not present

## 2022-12-13 DIAGNOSIS — Z1211 Encounter for screening for malignant neoplasm of colon: Secondary | ICD-10-CM | POA: Diagnosis not present

## 2022-12-13 DIAGNOSIS — F419 Anxiety disorder, unspecified: Secondary | ICD-10-CM

## 2022-12-13 DIAGNOSIS — F32A Depression, unspecified: Secondary | ICD-10-CM | POA: Insufficient documentation

## 2022-12-13 DIAGNOSIS — R232 Flushing: Secondary | ICD-10-CM | POA: Diagnosis not present

## 2022-12-13 DIAGNOSIS — R61 Generalized hyperhidrosis: Secondary | ICD-10-CM | POA: Insufficient documentation

## 2022-12-13 DIAGNOSIS — Z79890 Hormone replacement therapy (postmenopausal): Secondary | ICD-10-CM | POA: Diagnosis not present

## 2022-12-13 DIAGNOSIS — R6882 Decreased libido: Secondary | ICD-10-CM | POA: Diagnosis not present

## 2022-12-13 LAB — HEMOCCULT GUIAC POC 1CARD (OFFICE): Fecal Occult Blood, POC: NEGATIVE

## 2022-12-13 MED ORDER — ESTRADIOL 1 MG PO TABS
1.0000 mg | ORAL_TABLET | Freq: Every day | ORAL | 4 refills | Status: DC
  Filled 2023-02-10 (×2): qty 30, 30d supply, fill #0
  Filled 2023-03-16: qty 30, 30d supply, fill #1
  Filled 2023-04-19: qty 30, 30d supply, fill #2

## 2022-12-13 MED ORDER — PROGESTERONE 200 MG PO CAPS
200.0000 mg | ORAL_CAPSULE | Freq: Every day | ORAL | 4 refills | Status: DC
  Filled 2023-02-10 (×2): qty 30, 30d supply, fill #0
  Filled 2023-03-16: qty 30, 30d supply, fill #1
  Filled 2023-04-19: qty 30, 30d supply, fill #2

## 2022-12-13 NOTE — Progress Notes (Signed)
Patient ID: [name], female   DOB: [birth year], 57 y.o.   MRN: [id number] History of Present Illness: [name] is a 57 year old white female, married, PM in for a well woman gyn exam. She is having anxiety,brain fog, emotional,hot flashes, night sweats and waking up at night, and has body aches and no sex drive.      Component Value Date/Time   DIAGPAP  08/07/2020 1203    - Negative for intraepithelial lesion or malignancy (NILM)   HPVHIGH Negative 08/07/2020 1203   ADEQPAP  08/07/2020 1203    Satisfactory for evaluation; transformation zone component PRESENT.    PCP is Ronny Bacon NP   Current Medications, Allergies, Past Medical History, Past Surgical History, Family History and Social History were reviewed in [employer] record.     Review of Systems: Patient denies any headaches, hearing loss, fatigue, blurred vision, shortness of breath, chest pain, abdominal pain, problems with bowel movements, urination, or intercourse. No joint pain or mood swings.  See HPI for positives   Physical Exam:BP 133/87 (BP Location: Left Arm, Patient Position: Sitting, Cuff Size: Normal)   Pulse 80   Ht 5\' 3"  (1.6 m)   Wt 187 lb (84.8 kg)   LMP  (LMP Unknown) Comment: No period in two years  BMI 33.13 kg/m   General:  Well developed, well nourished, no acute distress Skin:  Warm and dry Neck:  Midline trachea, normal thyroid, good ROM, no lymphadenopathy Lungs; Clear to auscultation bilaterally Breast:  No dominant palpable mass, retraction, or nipple discharge Cardiovascular: Regular rate and rhythm Abdomen:  Soft, non tender, no hepatosplenomegaly Pelvic:  External genitalia is normal in appearance, no lesions.  The vagina is pale. Urethra has no lesions or masses. The cervix is smooth.  Uterus is felt to be normal size, shape, and contour.  No adnexal masses or tenderness noted.Bladder is non tender, no masses felt. Rectal: Good sphincter tone, no polyps, or  hemorrhoids felt.  Hemoccult negative. Extremities/musculoskeletal:  No swelling or varicosities noted, no clubbing or cyanosis Psych:  No mood changes, alert and cooperative,seems happy AA is 0 Fall risk is low    12/13/2022    8:38 AM 12/15/2021    8:32 AM 10/07/2020    2:54 PM  Depression screen PHQ 2/9  Decreased Interest 2 0 0  Down, Depressed, Hopeless 2 1 0  PHQ - 2 Score 4 1 0  Altered sleeping 2    Tired, decreased energy 2    Change in appetite 0    Feeling bad or failure about yourself  1    Trouble concentrating 2    Moving slowly or fidgety/restless 1    Suicidal thoughts 0    PHQ-9 Score 12         12/13/2022    8:39 AM 08/28/2020    2:45 PM 08/07/2020   12:01 PM 07/29/2020    3:41 PM  GAD 7 : Generalized Anxiety Score  Nervous, Anxious, on Edge 3 1 0 2  Control/stop worrying 2 1 0 3  Worry too much - different things 3 0 0 3  Trouble relaxing 2 0 0 3  Restless 1 0 0 0  Easily annoyed or irritable 3 1 2 3   Afraid - awful might happen 1 0 0 2  Total GAD 7 Score 15 3 2 16   Anxiety Difficulty  Not difficult at all  Somewhat difficult    Upstream - 12/13/22 0831  Pregnancy Intention Screening   Does the patient want to become pregnant in the next year? No    Does the patient's partner want to become pregnant in the next year? No    Would the patient like to discuss contraceptive options today? No      Contraception Wrap Up   Current Method No Method - Other Reason    Reason for No Current Contraceptive Method at Intake ([facility] Only) Other   Menopause   End Method No Method - Other Reason   menopause   Contraception Counseling Provided No              Examination chaperoned by Freddie Apley RN   Impression and plan: 1. Encounter for well woman exam with routine gynecological exam Pap and physical in 1 year Labs with PCP Colonoscopy per GI Mammogram was negative 04/18/22  2. Encounter for screening fecal occult blood testing Hemoccult was  negative  - POCT occult blood stool  3. Hot flashes Will try HRT   4. Postmenopause Try to walk 30 minutes every day   5. Decreased libido Try to increase frequency of sex and use good lubricate   6. Anxiety and depression Will start with HRT   7. Night sweats  8. Hormone replacement therapy (HRT) Discussed options with pros and cons and she wants to try HRT Will rx estrace 1 mg 1 daily and prometrium 200 mg 1 at HS Meds ordered this encounter  Medications   estradiol (ESTRACE) 1 MG tablet    Sig: Take 1 tablet (1 mg total) by mouth daily.    Dispense:  30 tablet    Refill:  4    Order Specific Question:   Supervising Provider    Answer:   Duane Lope H [[id number]]   progesterone (PROMETRIUM) 200 MG capsule    Sig: Take 1 daily at bedtime    Dispense:  30 capsule    Refill:  4    Order Specific Question:   Supervising Provider    Answer:   Duane Lope H [[id number]]   Follow up in 10 weeks for ROS

## 2023-01-30 ENCOUNTER — Encounter: Payer: Self-pay | Admitting: Family Medicine

## 2023-01-30 ENCOUNTER — Ambulatory Visit: Payer: Commercial Managed Care - PPO | Admitting: Family Medicine

## 2023-01-30 VITALS — BP 120/90 | HR 77 | Temp 98.2°F | Ht 63.0 in | Wt 187.0 lb

## 2023-01-30 DIAGNOSIS — F411 Generalized anxiety disorder: Secondary | ICD-10-CM | POA: Diagnosis not present

## 2023-01-30 DIAGNOSIS — E66811 Obesity, class 1: Secondary | ICD-10-CM | POA: Insufficient documentation

## 2023-01-30 DIAGNOSIS — I10 Essential (primary) hypertension: Secondary | ICD-10-CM | POA: Diagnosis not present

## 2023-01-30 DIAGNOSIS — Z23 Encounter for immunization: Secondary | ICD-10-CM | POA: Diagnosis not present

## 2023-01-30 NOTE — Assessment & Plan Note (Signed)
Counseled on importance of weight management for overall health. Encouraged low calorie, heart healthy diet and moderate intensity exercise 150 minutes weekly. This is 3-5 times weekly for 30-50 minutes each session. Goal should be pace of 3 miles/hours, or walking 1.5 miles in 30 minutes and include strength training. Discussed risks of obesity. Will consider MWM.

## 2023-01-30 NOTE — Assessment & Plan Note (Signed)
Uncontrolled. She would like to speak to a therapist. Currently unmedicated. Stressors include caregiving for her parents. Referral to psychiatry placed.

## 2023-01-30 NOTE — Assessment & Plan Note (Signed)
BP 120/90 today in office. Will monitor at home and bring readings to physical appt. Recommend heart healthy diet such as Mediterranean diet with whole grains, fruits, vegetable, fish, lean meats, nuts, and olive oil. Limit salt. Encouraged moderate walking, 3-5 times/week for 30-50 minutes each session. Aim for at least 150 minutes.week. Goal should be pace of 3 miles/hours, or walking 1.5 miles in 30 minutes. Avoid tobacco products. Avoid excess alcohol. Take medications as prescribed and bring medications and blood pressure log with cuff to each office visit. Seek medical care for chest pain, palpitations, shortness of breath with exertion, dizziness/lightheadedness, vision changes, recurrent headaches, or swelling of extremities.

## 2023-01-30 NOTE — Progress Notes (Signed)
New Patient Office Visit  Subjective    Patient ID: [name], female    DOB: [birth year]  Age: 57 y.o. MRN: [id number]  CC: No chief complaint on file.   HPI [name] presents to establish care. Oriented to practice routines and expectations. PMH includes depression, GERD, gastric sleeve surgery 12/2020, HTN. Concerns include weight management. She currently consumes a high protein, low fat and carbohydrate diet and exercises >30 minutes 3 days weekly.   Breast CA screening: Mammogram status: Completed 04/2022. Repeat every year Cervical CA screening: approximate date 07/2020 and was normal Colon CA screening: colonoscopy 7 years ago without abnormalities. Tobacco: non-smoker STI: declines Vaccines:  Tdap today  HYPERTENSION without Chronic Kidney Disease Hypertension status: controlled  Satisfied with current treatment? yes Duration of hypertension: chronic BP monitoring frequency:  not checking BP range:  BP medication side effects:  no Medication compliance:  unmedicated Previous BP meds: n/a Aspirin: no Recurrent headaches: no Visual changes: no Palpitations: no Dyspnea: no Chest pain: no Lower extremity edema: no Dizzy/lightheaded: no   Outpatient Encounter Medications as of 01/30/2023  Medication Sig   calcium carbonate (TUMS - DOSED IN MG ELEMENTAL CALCIUM) 500 MG chewable tablet Chew 1 tablet by mouth 3 (three) times daily.   estradiol (ESTRACE) 1 MG tablet Take 1 tablet (1 mg total) by mouth daily.   Multiple Vitamins-Minerals (MULTIVITAMIN ADULT) CHEW Chew 3 each by mouth daily. Vitafusion gummies 3 daily taken in am   progesterone (PROMETRIUM) 200 MG capsule Take 1 daily at bedtime   [DISCONTINUED] Cholecalciferol 125 MCG (5000 UT) capsule  (Patient not taking: Reported on 01/30/2023)   [DISCONTINUED] Multiple Vitamins-Minerals (MULTIVITAMIN WOMEN PO) Take by mouth. Hair skin and nails formula with biotin and D (Patient not taking: Reported on  12/13/2022)   [DISCONTINUED] ondansetron (ZOFRAN-ODT) 4 MG disintegrating tablet Take 1 tablet (4 mg total) by mouth every 6 (six) hours as needed for nausea or vomiting. (Patient not taking: Reported on 01/30/2023)   No facility-administered encounter medications on file as of 01/30/2023.    Past Medical History:  Diagnosis Date   Chronic gastritis    Complication of anesthesia    Depression    Duodenitis 08/2005   Dysphagia    Esophageal dilatation    GERD (gastroesophageal reflux disease)    Hypertension    PONV (postoperative nausea and vomiting)     Past Surgical History:  Procedure Laterality Date   BIOPSY  07/24/2020   Procedure: BIOPSY;  Surgeon: Corbin Ade, MD;  Location: [facility];  Service: Endoscopy;;   BREAST BIOPSY Right 04/26/2013   Procedure: BREAST BIOPSY TIMES TWO;  Surgeon: Dalia Heading, MD;  Location: [facility];  Service: General;  Laterality: Right;   BREAST MASS EXCISION     bilateral tumor removal   COLONOSCOPY N/A 01/27/2016   Procedure: COLONOSCOPY;  Surgeon: West Bali, MD;  Location: [facility];  Service: Endoscopy;  Laterality: N/A;  830   DILITATION & CURRETTAGE/HYSTROSCOPY WITH THERMACHOICE ABLATION N/A 10/11/2013   Procedure: DILATATION & CURETTAGE/HYSTEROSCOPY WITH THERMACHOICE ABLATION;  Surgeon: Lazaro Arms, MD;  Location: [facility];  Service: Gynecology;  Laterality: N/A;  total therapy time- 20 minutes temp  76 deg C   ESOPHAGEAL DILATION  08/2005   ESOPHAGOGASTRODUODENOSCOPY N/A 01/27/2016   Procedure: ESOPHAGOGASTRODUODENOSCOPY (EGD);  Surgeon: West Bali, MD;  Location: [facility];  Service: Endoscopy;  Laterality: N/A;   ESOPHAGOGASTRODUODENOSCOPY (EGD) WITH PROPOFOL N/A 07/24/2020   Procedure:  ESOPHAGOGASTRODUODENOSCOPY (EGD) WITH PROPOFOL;  Surgeon: Corbin Ade, MD;  [facility]: [facility];  Service: Endoscopy;  Laterality: N/A;  12:30pm   HIATAL HERNIA REPAIR N/A 12/14/2020   Procedure: HERNIA REPAIR HIATAL;   Surgeon: Kinsinger, De Blanch, MD;  Location: [facility];  Service: General;  Laterality: N/A;   LAPAROSCOPIC GASTRIC SLEEVE RESECTION N/A 12/14/2020   Procedure: LAPAROSCOPIC GASTRIC SLEEVE RESECTION;  Surgeon: Sheliah Hatch De Blanch, MD;  Location: [facility];  Service: General;  Laterality: N/A;   MALONEY DILATION N/A 07/24/2020   Procedure: Alvy Beal;  Surgeon: Corbin Ade, MD;  [facility]: [facility];  Service: Endoscopy;  Laterality: N/A;   POLYPECTOMY  07/24/2020   Procedure: POLYPECTOMY;  Surgeon: Corbin Ade, MD;  [facility];  Service: Endoscopy;;   SAVORY DILATION N/A 01/27/2016   Procedure: SAVORY DILATION;  Surgeon: West Bali, MD;  [facility]: [facility];  Service: Endoscopy;  Laterality: N/A;   TUBAL LIGATION     UPPER GI ENDOSCOPY N/A 12/14/2020   Procedure: UPPER GI ENDOSCOPY;  Surgeon: Sheliah Hatch, De Blanch, MD;  Location: [facility];  Service: General;  Laterality: N/A;   WISDOM TOOTH EXTRACTION      Family History  Problem Relation Age of Onset   Hypertension Father    Stroke Father    Heart disease Father    Heart disease Mother    Cancer Paternal Aunt        colon   Cancer Paternal Uncle        colon   Cancer Cousin        colon    Social History   Socioeconomic History   Marital status: Married    Spouse name: Not on file   Number of children: 2   Years of education: Not on file   Highest education level: Not on file  Occupational History   Occupation: Full time    Employer: [employer]  Tobacco Use   Smoking status: Never   Smokeless tobacco: Never  Vaping Use   Vaping status: Never Used  Substance and Sexual Activity   Alcohol use: No   Drug use: No   Sexual activity: Not Currently    Birth control/protection: Surgical, Post-menopausal    Comment: tubal & ablation, menopause  Other Topics Concern   Not on file  Social History Narrative   No regular exercise   Social Drivers of Health   Financial Resource  Strain: Low Risk  (12/13/2022)   Overall Financial Resource Strain (CARDIA)    Difficulty of Paying Living Expenses: Not very hard  Food Insecurity: No Food Insecurity (12/13/2022)   Hunger Vital Sign    Worried About Running Out of Food in the Last Year: Never true    Ran Out of Food in the Last Year: Never true  Transportation Needs: No Transportation Needs (12/13/2022)   PRAPARE - Administrator, Civil Service (Medical): No    Lack of Transportation (Non-Medical): No  Physical Activity: Insufficiently Active (12/13/2022)   Exercise Vital Sign    Days of Exercise per Week: 2 days    Minutes of Exercise per Session: 30 min  Stress: Stress Concern Present (12/13/2022)   [name] [employer] - Occupational Stress Questionnaire    Feeling of Stress : To some extent  Social Connections: Socially Integrated (12/13/2022)   Social Connection and Isolation Panel [NHANES]    Frequency of Communication with Friends and Family: Three times a week  Frequency of Social Gatherings with Friends and Family: Three times a week    Attends Religious Services: More than 4 times per year    Active Member of Clubs or Organizations: Yes    Attends Banker Meetings: More than 4 times per year    Marital Status: Married  Catering manager Violence: Not At Risk (12/13/2022)   Humiliation, Afraid, Rape, and Kick questionnaire    Fear of Current or Ex-Partner: No    Emotionally Abused: No    Physically Abused: No    Sexually Abused: No    Review of Systems  Respiratory: Negative.    Cardiovascular: Negative.   Gastrointestinal: Negative.   Psychiatric/Behavioral:  The patient is nervous/anxious.   All other systems reviewed and are negative.       Objective    BP (!) 120/90   Pulse 77   Temp 98.2 F (36.8 C) (Oral)   Ht 5\' 3"  (1.6 m)   Wt 187 lb (84.8 kg)   SpO2 97%   BMI 33.13 kg/m   Physical Exam Vitals and nursing note reviewed.   Constitutional:      Appearance: Normal appearance. She is normal weight.  HENT:     Head: Normocephalic and atraumatic.  Cardiovascular:     Rate and Rhythm: Normal rate and regular rhythm.     Pulses: Normal pulses.     Heart sounds: Normal heart sounds.  Pulmonary:     Effort: Pulmonary effort is normal.     Breath sounds: Normal breath sounds.  Skin:    General: Skin is warm and dry.  Neurological:     General: No focal deficit present.     Mental Status: She is alert and oriented to person, place, and time. Mental status is at baseline.  Psychiatric:        Mood and Affect: Mood normal.        Behavior: Behavior normal.        Thought Content: Thought content normal.        Judgment: Judgment normal.         Assessment & Plan:   Problem List Items Addressed This Visit     Essential hypertension, benign - Primary   BP 120/90 today in office. Will monitor at home and bring readings to physical appt. Recommend heart healthy diet such as Mediterranean diet with whole grains, fruits, vegetable, fish, lean meats, nuts, and olive oil. Limit salt. Encouraged moderate walking, 3-5 times/week for 30-50 minutes each session. Aim for at least 150 minutes.week. Goal should be pace of 3 miles/hours, or walking 1.5 miles in 30 minutes. Avoid tobacco products. Avoid excess alcohol. Take medications as prescribed and bring medications and blood pressure log with cuff to each office visit. Seek medical care for chest pain, palpitations, shortness of breath with exertion, dizziness/lightheadedness, vision changes, recurrent headaches, or swelling of extremities.       Relevant Orders   CBC with Differential/Platelet   COMPLETE METABOLIC PANEL WITH GFR   Lipid panel   Hemoglobin A1c   TSH   Generalized anxiety disorder   Uncontrolled. She would like to speak to a therapist. Currently unmedicated. Stressors include caregiving for her parents. Referral to psychiatry placed.       Relevant Orders   Ambulatory referral to Psychiatry   Obesity (BMI 30.0-34.9)   Counseled on importance of weight management for overall health. Encouraged low calorie, heart healthy diet and moderate intensity exercise 150 minutes weekly. This is 3-5 times  weekly for 30-50 minutes each session. Goal should be pace of 3 miles/hours, or walking 1.5 miles in 30 minutes and include strength training. Discussed risks of obesity. Will consider MWM.       Relevant Orders   CBC with Differential/Platelet   COMPLETE METABOLIC PANEL WITH GFR   Lipid panel   Hemoglobin A1c   TSH   Other Visit Diagnoses       Need for vaccination       Relevant Orders   Tdap vaccine greater than or equal to 7yo IM (Completed)       Return for annual physical with labs 1 week prior.   Park Meo, FNP

## 2023-02-10 ENCOUNTER — Other Ambulatory Visit (HOSPITAL_COMMUNITY): Payer: Self-pay

## 2023-02-13 ENCOUNTER — Other Ambulatory Visit (HOSPITAL_COMMUNITY): Payer: Self-pay

## 2023-02-13 ENCOUNTER — Encounter (HOSPITAL_COMMUNITY): Payer: Self-pay

## 2023-02-14 ENCOUNTER — Other Ambulatory Visit (HOSPITAL_COMMUNITY): Payer: Self-pay

## 2023-02-14 ENCOUNTER — Other Ambulatory Visit: Payer: Self-pay

## 2023-02-15 ENCOUNTER — Other Ambulatory Visit: Payer: Self-pay

## 2023-02-17 ENCOUNTER — Other Ambulatory Visit: Payer: Self-pay

## 2023-02-21 ENCOUNTER — Encounter: Payer: Self-pay | Admitting: Adult Health

## 2023-02-21 ENCOUNTER — Ambulatory Visit: Payer: Commercial Managed Care - PPO | Admitting: Adult Health

## 2023-02-21 VITALS — BP 132/80 | HR 70 | Ht 63.0 in | Wt 187.5 lb

## 2023-02-21 DIAGNOSIS — Z780 Asymptomatic menopausal state: Secondary | ICD-10-CM

## 2023-02-21 DIAGNOSIS — Z79890 Hormone replacement therapy (postmenopausal): Secondary | ICD-10-CM | POA: Diagnosis not present

## 2023-02-21 DIAGNOSIS — R61 Generalized hyperhidrosis: Secondary | ICD-10-CM

## 2023-02-21 DIAGNOSIS — Z1331 Encounter for screening for depression: Secondary | ICD-10-CM

## 2023-02-21 DIAGNOSIS — F419 Anxiety disorder, unspecified: Secondary | ICD-10-CM

## 2023-02-21 DIAGNOSIS — R232 Flushing: Secondary | ICD-10-CM | POA: Diagnosis not present

## 2023-02-21 DIAGNOSIS — F32A Depression, unspecified: Secondary | ICD-10-CM

## 2023-02-21 NOTE — Progress Notes (Signed)
Subjective:     Patient ID: [name], female   DOB: May 19, 1965, 58 y.o.   MRN: [id number]  HPI [name] is a 58 year old white female, married, PM back in follow up on starting HRT in November, and is better. Anxiety, hot flashes, night sweats and brain fog all better. Still achy and sex may be a little better. Prometrium seem to make her dizzy at times, but sleep good.     Component Value Date/Time   DIAGPAP  08/07/2020 1203    - Negative for intraepithelial lesion or malignancy (NILM)   HPVHIGH Negative 08/07/2020 1203   ADEQPAP  08/07/2020 1203    Satisfactory for evaluation; transformation zone component PRESENT.   PCP is Kurtis Bushman NP  Review of Systems Anxiety,hot flashes, night sweats and brain fog all better Still achy   sex may be a little better. Prometrium seem to make her dizzy at times, but sleep good. Reviewed past medical,surgical, social and family history. Reviewed medications and allergies.     Objective:   Physical Exam BP 132/80 (BP Location: Left Arm, Patient Position: Sitting, Cuff Size: Normal)   Pulse 70   Ht 5\' 3"  (1.6 m)   Wt 187 lb 8 oz (85 kg)   LMP  (LMP Unknown) Comment: No period in two years  BMI 33.21 kg/m     Skin warm and dry. Lungs: clear to ausculation bilaterally. Cardiovascular: regular rate and rhythm.  Fall risk is low    02/21/2023    9:19 AM 01/30/2023    8:51 AM 12/13/2022    8:38 AM  Depression screen PHQ 2/9  Decreased Interest 0 0 2  Down, Depressed, Hopeless 0 0 2  PHQ - 2 Score 0 0 4  Altered sleeping 0 0 2  Tired, decreased energy 1 2 2   Change in appetite 0 0 0  Feeling bad or failure about yourself  1 0 1  Trouble concentrating 0 0 2  Moving slowly or fidgety/restless 0 0 1  Suicidal thoughts 0 0 0  PHQ-9 Score 2 2 12   Difficult doing work/chores Not difficult at all Somewhat difficult        02/21/2023    9:21 AM 01/30/2023    8:51 AM 12/13/2022    8:39 AM 08/28/2020    2:45 PM  GAD 7 : Generalized  Anxiety Score  Nervous, Anxious, on Edge 0 1 3 1   Control/stop worrying 0 1 2 1   Worry too much - different things 1 1 3  0  Trouble relaxing 1 1 2  0  Restless 0 0 1 0  Easily annoyed or irritable 1 0 3 1  Afraid - awful might happen 0 0 1 0  Total GAD 7 Score 3 4 15 3   Anxiety Difficulty  Not difficult at all  Not difficult at all      Upstream - 02/21/23 0919       Pregnancy Intention Screening   Does the patient want to become pregnant in the next year? N/A    Does the patient's partner want to become pregnant in the next year? N/A    Would the patient like to discuss contraceptive options today? N/A      Contraception Wrap Up   Current Method Female Sterilization   PM   End Method Female Sterilization   PM   Contraception Counseling Provided No             Assessment:     1. Hormone replacement  therapy (HRT) (Primary) Will continue estrace 1 mg daily and Prometrium 200 mg, has refills, but if still notices dizziness at times,can decrease Prometrium to 100 mg   2. Postmenopause Brain fog better   3. Hot flashes Better on HRT  4. Night sweats Better on HRT  5. Anxiety and depression Better, less anxiety    Plan:     Return in November for pap and physical or sooner if needed

## 2023-03-03 ENCOUNTER — Other Ambulatory Visit: Payer: Commercial Managed Care - PPO

## 2023-03-03 DIAGNOSIS — I10 Essential (primary) hypertension: Secondary | ICD-10-CM | POA: Diagnosis not present

## 2023-03-03 DIAGNOSIS — E66811 Obesity, class 1: Secondary | ICD-10-CM

## 2023-03-04 LAB — CBC WITH DIFFERENTIAL/PLATELET
Absolute Lymphocytes: 2443 {cells}/uL (ref 850–3900)
Absolute Monocytes: 455 {cells}/uL (ref 200–950)
Basophils Absolute: 69 {cells}/uL (ref 0–200)
Basophils Relative: 1 %
Eosinophils Absolute: 117 {cells}/uL (ref 15–500)
Eosinophils Relative: 1.7 %
HCT: 43.8 % (ref 35.0–45.0)
Hemoglobin: 14.4 g/dL (ref 11.7–15.5)
MCH: 28.7 pg (ref 27.0–33.0)
MCHC: 32.9 g/dL (ref 32.0–36.0)
MCV: 87.3 fL (ref 80.0–100.0)
MPV: 10.1 fL (ref 7.5–12.5)
Monocytes Relative: 6.6 %
Neutro Abs: 3816 {cells}/uL (ref 1500–7800)
Neutrophils Relative %: 55.3 %
Platelets: 278 10*3/uL (ref 140–400)
RBC: 5.02 10*6/uL (ref 3.80–5.10)
RDW: 13.1 % (ref 11.0–15.0)
Total Lymphocyte: 35.4 %
WBC: 6.9 10*3/uL (ref 3.8–10.8)

## 2023-03-04 LAB — LIPID PANEL
Cholesterol: 204 mg/dL — ABNORMAL HIGH (ref <200)
HDL: 48 mg/dL — ABNORMAL LOW (ref >=50)
LDL Cholesterol (Calc): 128 mg/dL — ABNORMAL HIGH
Non-HDL Cholesterol (Calc): 156 mg/dL — ABNORMAL HIGH (ref <130)
Total CHOL/HDL Ratio: 4.3 (calc) (ref <5.0)
Triglycerides: 165 mg/dL — ABNORMAL HIGH (ref <150)

## 2023-03-04 LAB — COMPLETE METABOLIC PANEL WITH GFR
AG Ratio: 1.5 (calc) (ref 1.0–2.5)
ALT: 12 U/L (ref 6–29)
AST: 14 U/L (ref 10–35)
Albumin: 4.2 g/dL (ref 3.6–5.1)
Alkaline phosphatase (APISO): 81 U/L (ref 37–153)
BUN: 11 mg/dL (ref 7–25)
CO2: 25 mmol/L (ref 20–32)
Calcium: 9 mg/dL (ref 8.6–10.4)
Chloride: 108 mmol/L (ref 98–110)
Creat: 0.66 mg/dL (ref 0.50–1.03)
Globulin: 2.8 g/dL (ref 1.9–3.7)
Glucose, Bld: 97 mg/dL (ref 65–99)
Potassium: 4.3 mmol/L (ref 3.5–5.3)
Sodium: 142 mmol/L (ref 135–146)
Total Bilirubin: 0.5 mg/dL (ref 0.2–1.2)
Total Protein: 7 g/dL (ref 6.1–8.1)
eGFR: 102 mL/min/{1.73_m2} (ref >=60)

## 2023-03-04 LAB — HEMOGLOBIN A1C
Hgb A1c MFr Bld: 5.6 %{Hb} (ref <5.7)
Mean Plasma Glucose: 114 mg/dL
eAG (mmol/L): 6.3 mmol/L

## 2023-03-04 LAB — TSH: TSH: 1.77 m[IU]/L (ref 0.40–4.50)

## 2023-03-07 ENCOUNTER — Encounter: Payer: Self-pay | Admitting: Family Medicine

## 2023-03-07 ENCOUNTER — Ambulatory Visit (INDEPENDENT_AMBULATORY_CARE_PROVIDER_SITE_OTHER): Payer: Commercial Managed Care - PPO | Admitting: Family Medicine

## 2023-03-07 VITALS — BP 130/90 | HR 79 | Temp 98.4°F | Ht 63.0 in | Wt 187.0 lb

## 2023-03-07 DIAGNOSIS — Z0001 Encounter for general adult medical examination with abnormal findings: Secondary | ICD-10-CM | POA: Diagnosis not present

## 2023-03-07 DIAGNOSIS — Z0000 Encounter for general adult medical examination without abnormal findings: Secondary | ICD-10-CM | POA: Insufficient documentation

## 2023-03-07 DIAGNOSIS — I10 Essential (primary) hypertension: Secondary | ICD-10-CM | POA: Diagnosis not present

## 2023-03-07 DIAGNOSIS — E782 Mixed hyperlipidemia: Secondary | ICD-10-CM | POA: Diagnosis not present

## 2023-03-07 DIAGNOSIS — Z6833 Body mass index (BMI) 33.0-33.9, adult: Secondary | ICD-10-CM | POA: Diagnosis not present

## 2023-03-07 DIAGNOSIS — E66811 Obesity, class 1: Secondary | ICD-10-CM | POA: Diagnosis not present

## 2023-03-07 NOTE — Assessment & Plan Note (Signed)
BMI 33.13, waist circumference 45in. Continue heart healthy diet and exercise routine. No contraindications to Tirzepatide, start 2.5mg  weekly x4 weeks and increase as tolerated. Follow up in office in 4 weeks. Goal weight 150 lbs.

## 2023-03-07 NOTE — Assessment & Plan Note (Signed)

## 2023-03-07 NOTE — Progress Notes (Signed)
 Complete physical exam  Patient: [name]   DOB: [birth year]   58 y.o. Female  MRN: [id number]  Subjective:    Chief Complaint  Patient presents with   Follow-up    annual physical     [name] is a 58 y.o. female who presents today for a complete physical exam. She reports consuming a  weight loss  diet. Home exercise routine includes infinity hoop 30 minutes x4d weekly. She generally feels well. She reports sleeping well. She does have additional problems to discuss today including weight management s/p gastric sleeve. Her goal weight is 150-170 lbs. She currently consumes a low calorie diet using weight watchers. She denies personal history of pancreatitis, personal or family history MEN2 or MTC.  Ms Guidry will complete her mammogram and PAP with OB.  HYPERTENSION / HYPERLIPIDEMIA Satisfied with current treatment?  unmedicated Duration of hypertension:  unspecified BP monitoring frequency: a few times a month BP range: 147/85, 146/77, 125/88, 135/87, 123/76, 137/87, 138/86 BP medication side effects:  n/a Past BP meds: none Duration of hyperlipidemia:  unspecified Cholesterol medication side effects: n/a Cholesterol supplements: none Past cholesterol medications: none and lovaza Medication compliance:  unmedicated Aspirin: no Recent stressors: no Recurrent headaches: no Visual changes: no Palpitations: no Dyspnea: no Chest pain: no Lower extremity edema: no Dizzy/lightheaded: no  The 10-year ASCVD risk score (Arnett DK, et al., 2019) is: 2.9%   Values used to calculate the score:     Age: 69 years     Sex: Female     Is Non-Hispanic African American: No     Diabetic: No     Tobacco smoker: No     Systolic Blood Pressure: 130 mmHg     Is BP treated: No     HDL Cholesterol: 48 mg/dL     Total Cholesterol: 204 mg/dL    Most recent fall risk assessment:    02/21/2023    9:18 AM  Fall Risk   Falls in the past year? 0  Number falls in past yr: 0   Injury with Fall? 0     Most recent depression screenings:    03/07/2023    8:07 AM 02/21/2023    9:19 AM  PHQ 2/9 Scores  PHQ - 2 Score 0 0  PHQ- 9 Score 1 2    Vision:Within last year and Dental: No current dental problems and Receives regular dental care  Patient Active Problem List   Diagnosis Date Noted   Moderate mixed hyperlipidemia not requiring statin therapy 03/07/2023   Physical exam, annual 03/07/2023   Obesity (BMI 30.0-34.9) 01/30/2023   Night sweats 12/13/2022   Anxiety and depression 12/13/2022   Postmenopause 12/13/2022   Encounter for well woman exam with routine gynecological exam 12/13/2022   Hormone replacement therapy (HRT) 12/13/2022   Class II obesity 12/14/2020   Encounter for screening fecal occult blood testing 08/07/2020   Encounter for gynecological examination with Papanicolaou smear of cervix 08/07/2020   Cervical polyp 08/07/2020   Peri-menopause 08/07/2020   Decreased libido 08/07/2020   Hot flashes 08/07/2020   Moody 08/07/2020   Generalized anxiety disorder 07/31/2020   BMI 37.0-37.9, adult 07/31/2020   Nutritional counseling 09/25/2017   Depression with anxiety 08/26/2015   Leiomyoma of uterus, unspecified 09/23/2013   Mixed stress and urge urinary incontinence 07/16/2012   Essential hypertension, benign 07/16/2012   Obesity 07/16/2012   Insomnia 07/16/2012   ESOPHAGEAL REFLUX 09/11/2007   GASTRITIS, ANTRAL 09/11/2007   HEADACHES,  HX OF 09/11/2007   Past Medical History:  Diagnosis Date   Chronic gastritis    Complication of anesthesia    Depression    Duodenitis 08/2005   Dysphagia    Esophageal dilatation    GERD (gastroesophageal reflux disease)    Hypertension    PONV (postoperative nausea and vomiting)    Past Surgical History:  Procedure Laterality Date   BIOPSY  07/24/2020   Procedure: BIOPSY;  Surgeon: Shaaron Lamar HERO, MD;  [facility];  Service: Endoscopy;;   BREAST BIOPSY Right 04/26/2013    Procedure: BREAST BIOPSY TIMES TWO;  Surgeon: Oneil DELENA Budge, MD;  Location: [facility];  Service: General;  Laterality: Right;   BREAST MASS EXCISION     bilateral tumor removal   COLONOSCOPY N/A 01/27/2016   Procedure: COLONOSCOPY;  Surgeon: Margo LITTIE Haddock, MD;  [facility]: [facility];  Service: Endoscopy;  Laterality: N/A;  830   DILITATION & CURRETTAGE/HYSTROSCOPY WITH THERMACHOICE ABLATION N/A 10/11/2013   Procedure: DILATATION & CURETTAGE/HYSTEROSCOPY WITH THERMACHOICE ABLATION;  Surgeon: Vonn VEAR Inch, MD;  Location: [facility];  Service: Gynecology;  Laterality: N/A;  total therapy time- 20 minutes temp  76 deg C   ESOPHAGEAL DILATION  08/2005   ESOPHAGOGASTRODUODENOSCOPY N/A 01/27/2016   Procedure: ESOPHAGOGASTRODUODENOSCOPY (EGD);  Surgeon: Margo LITTIE Haddock, MD;  [facility];  Service: Endoscopy;  Laterality: N/A;   ESOPHAGOGASTRODUODENOSCOPY (EGD) WITH PROPOFOL  N/A 07/24/2020   Procedure: ESOPHAGOGASTRODUODENOSCOPY (EGD) WITH PROPOFOL ;  Surgeon: Shaaron Lamar HERO, MD;  [facility]: [facility];  Service: Endoscopy;  Laterality: N/A;  12:30pm   HIATAL HERNIA REPAIR N/A 12/14/2020   Procedure: HERNIA REPAIR HIATAL;  Surgeon: Kinsinger, Herlene Righter, MD;  Location: [facility];  Service: General;  Laterality: N/A;   LAPAROSCOPIC GASTRIC SLEEVE RESECTION N/A 12/14/2020   Procedure: LAPAROSCOPIC GASTRIC SLEEVE RESECTION;  Surgeon: Stevie Herlene Righter, MD;  Location: [facility];  Service: General;  Laterality: N/A;   MALONEY DILATION N/A 07/24/2020   Procedure: AGAPITO HODGKIN;  Surgeon: Shaaron Lamar HERO, MD;  [facility];  Service: Endoscopy;  Laterality: N/A;   POLYPECTOMY  07/24/2020   Procedure: POLYPECTOMY;  Surgeon: Shaaron Lamar HERO, MD;  [facility];  Service: Endoscopy;;   SAVORY DILATION N/A 01/27/2016   Procedure: SAVORY DILATION;  Surgeon: Margo LITTIE Haddock, MD;  [facility];  Service: Endoscopy;  Laterality: N/A;   TUBAL LIGATION     UPPER GI ENDOSCOPY N/A  12/14/2020   Procedure: UPPER GI ENDOSCOPY;  Surgeon: Stevie, Herlene Righter, MD;  Location: [facility];  Service: General;  Laterality: N/A;   WISDOM TOOTH EXTRACTION     Social History   Tobacco Use   Smoking status: Never   Smokeless tobacco: Never  Vaping Use   Vaping status: Never Used  Substance Use Topics   Alcohol use: No   Drug use: No   Family History  Problem Relation Age of Onset   Hypertension Father    Stroke Father    Heart disease Father    Heart disease Mother    Cancer Paternal Aunt        colon   Cancer Paternal Uncle        colon   Cancer Cousin        colon   Allergies  Allergen Reactions   Lisinopril Cough      Patient Care Team: Kayla Jeoffrey RAMAN, FNP as PCP - General (Family Medicine)   Outpatient Medications Prior to  Visit  Medication Sig   calcium carbonate (TUMS - DOSED IN MG ELEMENTAL CALCIUM) 500 MG chewable tablet Chew 1 tablet by mouth 3 (three) times daily.   estradiol  (ESTRACE ) 1 MG tablet Take 1 tablet (1 mg total) by mouth daily.   Multiple Vitamins-Minerals (MULTIVITAMIN ADULT) CHEW Chew 3 each by mouth daily. Vitafusion gummies 3 daily taken in am   progesterone  (PROMETRIUM ) 200 MG capsule Take 1 capsule (200 mg total) by mouth at bedtime.   No facility-administered medications prior to visit.    Review of Systems  Constitutional: Negative.   HENT: Negative.    Eyes: Negative.   Respiratory: Negative.    Cardiovascular: Negative.   Gastrointestinal: Negative.   Genitourinary: Negative.   Musculoskeletal: Negative.   Skin: Negative.   Neurological: Negative.   Endo/Heme/Allergies: Negative.   Psychiatric/Behavioral: Negative.    All other systems reviewed and are negative.         Objective:     BP (!) 130/90   Pulse 79   Temp 98.4 F (36.9 C) (Oral)   Ht 5' 3 (1.6 m)   Wt 187 lb (84.8 kg)   LMP  (LMP Unknown) Comment: No period in two years  SpO2 98%   BMI 33.13 kg/m  BP Readings from Last 3 Encounters:   03/07/23 (!) 130/90  02/21/23 132/80  01/30/23 (!) 120/90   Wt Readings from Last 3 Encounters:  03/07/23 187 lb (84.8 kg)  02/21/23 187 lb 8 oz (85 kg)  01/30/23 187 lb (84.8 kg)      Physical Exam Vitals and nursing note reviewed.  Constitutional:      Appearance: Normal appearance. She is obese.  HENT:     Head: Normocephalic and atraumatic.     Right Ear: Tympanic membrane, ear canal and external ear normal.     Left Ear: Tympanic membrane, ear canal and external ear normal.     Nose: Nose normal.     Mouth/Throat:     Mouth: Mucous membranes are moist.     Pharynx: Oropharynx is clear.  Eyes:     Extraocular Movements: Extraocular movements intact.     Conjunctiva/sclera: Conjunctivae normal.     Pupils: Pupils are equal, round, and reactive to light.  Cardiovascular:     Rate and Rhythm: Normal rate and regular rhythm.     Pulses: Normal pulses.     Heart sounds: Normal heart sounds.  Pulmonary:     Effort: Pulmonary effort is normal.     Breath sounds: Normal breath sounds.  Abdominal:     General: Bowel sounds are normal.     Palpations: Abdomen is soft.  Musculoskeletal:        General: Normal range of motion.     Cervical back: Normal range of motion and neck supple.  Skin:    General: Skin is warm and dry.     Capillary Refill: Capillary refill takes less than 2 seconds.  Neurological:     General: No focal deficit present.     Mental Status: She is alert and oriented to person, place, and time. Mental status is at baseline.  Psychiatric:        Mood and Affect: Mood normal.        Behavior: Behavior normal.        Thought Content: Thought content normal.        Judgment: Judgment normal.      No results found for any visits on 03/07/23. Last CBC Lab  Results  Component Value Date   WBC 6.9 03/03/2023   HGB 14.4 03/03/2023   HCT 43.8 03/03/2023   MCV 87.3 03/03/2023   MCH 28.7 03/03/2023   RDW 13.1 03/03/2023   PLT 278 03/03/2023   Last  metabolic panel Lab Results  Component Value Date   GLUCOSE 97 03/03/2023   NA 142 03/03/2023   K 4.3 03/03/2023   CL 108 03/03/2023   CO2 25 03/03/2023   BUN 11 03/03/2023   CREATININE 0.66 03/03/2023   EGFR 102 03/03/2023   CALCIUM 9.0 03/03/2023   PROT 7.0 03/03/2023   ALBUMIN 4.2 12/15/2020   BILITOT 0.5 03/03/2023   ALKPHOS 77 12/15/2020   AST 14 03/03/2023   ALT 12 03/03/2023   ANIONGAP 10 12/15/2020   Last lipids Lab Results  Component Value Date   CHOL 204 (H) 03/03/2023   HDL 48 (L) 03/03/2023   LDLCALC 128 (H) 03/03/2023   TRIG 165 (H) 03/03/2023   CHOLHDL 4.3 03/03/2023   Last hemoglobin A1c Lab Results  Component Value Date   HGBA1C 5.6 03/03/2023   Last thyroid  functions Lab Results  Component Value Date   TSH 1.77 03/03/2023   Last vitamin D Lab Results  Component Value Date   VD25OH 56.5 02/19/2021   Last vitamin B12 and Folate Lab Results  Component Value Date   VITAMINB12 >2,000 02/19/2021        Assessment & Plan:    Routine Health Maintenance and Physical Exam  Immunization History  Administered Date(s) Administered   Influenza-Unspecified 11/15/2022   Moderna Sars-Covid-2 Vaccination 02/05/2019, 02/26/2019   Td 01/16/2013   Tdap 01/30/2023    Health Maintenance  Topic Date Due   Hepatitis C Screening  01/30/2024 (Originally 10/25/1983)   HIV Screening  01/30/2024 (Originally 10/24/1980)   Zoster Vaccines- Shingrix (1 of 2) 02/01/2024 (Originally 10/24/1984)   MAMMOGRAM  04/18/2023   Cervical Cancer Screening (HPV/Pap Cotest)  08/07/2025   Colonoscopy  01/26/2026   DTaP/Tdap/Td (3 - Td or Tdap) 01/29/2033   INFLUENZA VACCINE  Completed   HPV VACCINES  Aged Out   COVID-19 Vaccine  Discontinued    Discussed health benefits of physical activity, and encouraged her to engage in regular exercise appropriate for her age and condition.  Problem List Items Addressed This Visit     Essential hypertension, benign   BP <140/90 on  home readings. Will continue lifestyle modifications. I believe weight management will help with this, starting Tirzepatide.  Recommend heart healthy diet such as Mediterranean diet with whole grains, fruits, vegetable, fish, lean meats, nuts, and olive oil. Limit salt. Encouraged moderate walking, 3-5 times/week for 30-50 minutes each session. Aim for at least 150 minutes.week. Goal should be pace of 3 miles/hours, or walking 1.5 miles in 30 minutes. Avoid tobacco products. Avoid excess alcohol. Take medications as prescribed and bring medications and blood pressure log with cuff to each office visit. Seek medical care for chest pain, palpitations, shortness of breath with exertion, dizziness/lightheadedness, vision changes, recurrent headaches, or swelling of extremities. Follow up in 4 weeks       Obesity   BMI 33.13, waist circumference 45in. Continue heart healthy diet and exercise routine. No contraindications to Tirzepatide, start 2.5mg  weekly x4 weeks and increase as tolerated. Follow up in office in 4 weeks. Goal weight 150 lbs.       Moderate mixed hyperlipidemia not requiring statin therapy   I recommend consuming a heart healthy diet such as Mediterranean diet or DASH  diet with whole grains, fruits, vegetable, fish, lean meats, nuts, and olive oil. Limit sweets and processed foods. I also encourage moderate intensity exercise 150 minutes weekly. This is 3-5 times weekly for 30-50 minutes each session. Goal should be pace of 3 miles/hours, or walking 1.5 miles in 30 minutes. The 10-year ASCVD risk score (Arnett DK, et al., 2019) is: 2.9%       Physical exam, annual - Primary   Today your medical history was reviewed and routine physical exam with labs was performed. Recommend 150 minutes of moderate intensity exercise weekly and consuming a well-balanced diet. Advised to stop smoking if a smoker, avoid smoking if a non-smoker, limit alcohol consumption to 1 drink per day for women and 2  drinks per day for men, and avoid illicit drug use. Counseled in mental health awareness and when to seek medical care. Vaccine maintenance discussed. Appropriate health maintenance items reviewed. Return to office in 1 year for annual physical exam.       Return in about 5 weeks (around 04/11/2023) for weight management.     Jeoffrey GORMAN Barrio, FNP

## 2023-03-07 NOTE — Assessment & Plan Note (Signed)
 BP <140/90 on home readings. Will continue lifestyle modifications. I believe weight management will help with this, starting Tirzepatide.  Recommend heart healthy diet such as Mediterranean diet with whole grains, fruits, vegetable, fish, lean meats, nuts, and olive oil. Limit salt. Encouraged moderate walking, 3-5 times/week for 30-50 minutes each session. Aim for at least 150 minutes.week. Goal should be pace of 3 miles/hours, or walking 1.5 miles in 30 minutes. Avoid tobacco products. Avoid excess alcohol. Take medications as prescribed and bring medications and blood pressure log with cuff to each office visit. Seek medical care for chest pain, palpitations, shortness of breath with exertion, dizziness/lightheadedness, vision changes, recurrent headaches, or swelling of extremities. Follow up in 4 weeks

## 2023-03-07 NOTE — Assessment & Plan Note (Signed)
 I recommend consuming a heart healthy diet such as Mediterranean diet or DASH diet with whole grains, fruits, vegetable, fish, lean meats, nuts, and olive oil. Limit sweets and processed foods. I also encourage moderate intensity exercise 150 minutes weekly. This is 3-5 times weekly for 30-50 minutes each session. Goal should be pace of 3 miles/hours, or walking 1.5 miles in 30 minutes. The 10-year ASCVD risk score (Arnett DK, et al., 2019) is: 2.9%

## 2023-03-16 ENCOUNTER — Encounter (HOSPITAL_COMMUNITY): Payer: Self-pay

## 2023-03-16 ENCOUNTER — Other Ambulatory Visit (HOSPITAL_COMMUNITY): Payer: Self-pay

## 2023-04-11 ENCOUNTER — Encounter: Payer: Self-pay | Admitting: Family Medicine

## 2023-04-11 ENCOUNTER — Ambulatory Visit: Payer: Commercial Managed Care - PPO | Admitting: Family Medicine

## 2023-04-11 VITALS — BP 124/82 | HR 77 | Temp 98.7°F | Ht 63.0 in | Wt 183.2 lb

## 2023-04-11 DIAGNOSIS — E66811 Obesity, class 1: Secondary | ICD-10-CM

## 2023-04-11 DIAGNOSIS — Z6833 Body mass index (BMI) 33.0-33.9, adult: Secondary | ICD-10-CM

## 2023-04-11 NOTE — Progress Notes (Signed)
 Subjective:  HPI: [name] is a 58 y.o. female presenting on 04/11/2023 for Medical Management of Chronic Issues (5 week f/u.)   HPI Patient is in today for weight management follow-up. She has completed 4 weeks of 0.25 mg weekly semaglutide. She does endorse occasional mild diarrhea but is tolerating well. She continues to work on diet and exercise and is seeing decrease in appetite. She is not counting calories.[name] is walking 30 minutes per day 3 days weekly.  WEIGHT MANAGEMENT Starting BMI: 33.13 Current BMI: 32.46 Starting Waist 45 in Goal weight: 150 lbs Diet: reduced calorie, high protein Exercise: Moderate 30 min 3-4x/wk Mode: walking Medication: Semaglutide 0.25mg  weekly Side Effects: none   Review of Systems  Gastrointestinal: Negative.   All other systems reviewed and are negative.   Relevant past medical history reviewed and updated as indicated.   Past Medical History:  Diagnosis Date   Chronic gastritis    Complication of anesthesia    Depression    Duodenitis 08/2005   Dysphagia    Esophageal dilatation    GERD (gastroesophageal reflux disease)    Hypertension    PONV (postoperative nausea and vomiting)      Past Surgical History:  Procedure Laterality Date   BIOPSY  07/24/2020   Procedure: BIOPSY;  Surgeon: Corbin Ade, MD;  [facility];  Service: Endoscopy;;   BREAST BIOPSY Right 04/26/2013   Procedure: BREAST BIOPSY TIMES TWO;  Surgeon: Dalia Heading, MD;  Location: [facility];  Service: General;  Laterality: Right;   BREAST MASS EXCISION     bilateral tumor removal   COLONOSCOPY N/A 01/27/2016   Procedure: COLONOSCOPY;  Surgeon: West Bali, MD;  [facility];  Service: Endoscopy;  Laterality: N/A;  830   DILITATION & CURRETTAGE/HYSTROSCOPY WITH THERMACHOICE ABLATION N/A 10/11/2013   Procedure: DILATATION & CURETTAGE/HYSTEROSCOPY WITH THERMACHOICE ABLATION;  Surgeon: Lazaro Arms, MD;  Location: [facility];   Service: Gynecology;  Laterality: N/A;  total therapy time- 20 minutes temp  76 deg C   ESOPHAGEAL DILATION  08/2005   ESOPHAGOGASTRODUODENOSCOPY N/A 01/27/2016   Procedure: ESOPHAGOGASTRODUODENOSCOPY (EGD);  Surgeon: West Bali, MD;  [facility];  Service: Endoscopy;  Laterality: N/A;   ESOPHAGOGASTRODUODENOSCOPY (EGD) WITH PROPOFOL N/A 07/24/2020   Procedure: ESOPHAGOGASTRODUODENOSCOPY (EGD) WITH PROPOFOL;  Surgeon: Corbin Ade, MD;  [facility];  Service: Endoscopy;  Laterality: N/A;  12:30pm   HIATAL HERNIA REPAIR N/A 12/14/2020   Procedure: HERNIA REPAIR HIATAL;  Surgeon: Kinsinger, De Blanch, MD;  Location: [facility];  Service: General;  Laterality: N/A;   LAPAROSCOPIC GASTRIC SLEEVE RESECTION N/A 12/14/2020   Procedure: LAPAROSCOPIC GASTRIC SLEEVE RESECTION;  Surgeon: Sheliah Hatch De Blanch, MD;  Location: [facility];  Service: General;  Laterality: N/A;   MALONEY DILATION N/A 07/24/2020   Procedure: Alvy Beal;  Surgeon: Corbin Ade, MD;  [facility];  Service: Endoscopy;  Laterality: N/A;   POLYPECTOMY  07/24/2020   Procedure: POLYPECTOMY;  Surgeon: Corbin Ade, MD;  [facility];  Service: Endoscopy;;   SAVORY DILATION N/A 01/27/2016   Procedure: SAVORY DILATION;  Surgeon: West Bali, MD;  [facility];  Service: Endoscopy;  Laterality: N/A;   TUBAL LIGATION     UPPER GI ENDOSCOPY N/A 12/14/2020   Procedure: UPPER GI ENDOSCOPY;  Surgeon: Sheliah Hatch, De Blanch, MD;  Location: [facility];  Service: General;  Laterality: N/A;   WISDOM TOOTH EXTRACTION  Allergies and medications reviewed and updated.   Current Outpatient Medications:    calcium carbonate (TUMS - DOSED IN MG ELEMENTAL CALCIUM) 500 MG chewable tablet, Chew 1 tablet by mouth 3 (three) times daily., Disp: , Rfl:    estradiol (ESTRACE) 1 MG tablet, Take 1 tablet (1 mg total) by mouth daily., Disp: 30 tablet, Rfl: 4   Multiple Vitamins-Minerals  (MULTIVITAMIN ADULT) CHEW, Chew 3 each by mouth daily. Vitafusion gummies 3 daily taken in am, Disp: , Rfl:    progesterone (PROMETRIUM) 200 MG capsule, Take 1 capsule (200 mg total) by mouth at bedtime., Disp: 30 capsule, Rfl: 4  Allergies  Allergen Reactions   Lisinopril Cough    Objective:   BP 124/82   Pulse 77   Temp 98.7 F (37.1 C)   Ht 5\' 3"  (1.6 m)   Wt 183 lb 4 oz (83.1 kg)   LMP  (LMP Unknown) Comment: No period in two years  SpO2 98%   BMI 32.46 kg/m      04/11/2023    8:04 AM 03/07/2023    7:59 AM 02/21/2023    9:13 AM  Vitals with BMI  Height 5\' 3"  5\' 3"  5\' 3"   Weight 183 lbs 4 oz 187 lbs 187 lbs 8 oz  BMI 32.47 33.13 33.22  Systolic 124 130 295  Diastolic 82 90 80  Pulse 77 79 70     Physical Exam Vitals and nursing note reviewed.  Constitutional:      Appearance: Normal appearance. She is normal weight.  HENT:     Head: Normocephalic and atraumatic.  Skin:    General: Skin is warm and dry.  Neurological:     General: No focal deficit present.     Mental Status: She is alert and oriented to person, place, and time. Mental status is at baseline.  Psychiatric:        Mood and Affect: Mood normal.        Behavior: Behavior normal.        Thought Content: Thought content normal.        Judgment: Judgment normal.     Assessment & Plan:  Class 1 obesity with serious comorbidity and body mass index (BMI) of 33.0 to 33.9 in adult, unspecified obesity type Assessment & Plan: BMI 32.46 from 33.13. Continue heart healthy, low calorie, high protein diet and exercise routine. Goal weight 150lbs. Increase Semaglutide 0.5mg  weekly. Follow up in 4 weeks.       Follow up plan: Return in about 4 weeks (around 05/09/2023) for weight management.  Park Meo, FNP

## 2023-04-11 NOTE — Assessment & Plan Note (Signed)
 BMI 32.46 from 33.13. Continue heart healthy, low calorie, high protein diet and exercise routine. Goal weight 150lbs. Increase Semaglutide 0.5mg  weekly. Follow up in 4 weeks.

## 2023-04-19 ENCOUNTER — Other Ambulatory Visit (HOSPITAL_COMMUNITY): Payer: Self-pay

## 2023-05-10 ENCOUNTER — Ambulatory Visit: Admitting: Family Medicine

## 2023-05-10 ENCOUNTER — Encounter: Payer: Self-pay | Admitting: Family Medicine

## 2023-05-10 VITALS — BP 118/80 | HR 78 | Temp 98.0°F | Ht 63.0 in | Wt 179.0 lb

## 2023-05-10 DIAGNOSIS — Z6833 Body mass index (BMI) 33.0-33.9, adult: Secondary | ICD-10-CM | POA: Diagnosis not present

## 2023-05-10 DIAGNOSIS — E66811 Obesity, class 1: Secondary | ICD-10-CM | POA: Diagnosis not present

## 2023-05-10 DIAGNOSIS — I10 Essential (primary) hypertension: Secondary | ICD-10-CM | POA: Diagnosis not present

## 2023-05-10 NOTE — Assessment & Plan Note (Signed)
 Chronic well controlled.  Recommend heart healthy diet such as Mediterranean diet with whole grains, fruits, vegetable, fish, lean meats, nuts, and olive oil. Limit salt. Encouraged moderate walking, 3-5 times/week for 30-50 minutes each session. Aim for at least 150 minutes.week. Goal should be pace of 3 miles/hours, or walking 1.5 miles in 30 minutes. Avoid tobacco products. Avoid excess alcohol. Take medications as prescribed and bring medications and blood pressure log with cuff to each office visit. Seek medical care for chest pain, palpitations, shortness of breath with exertion, dizziness/lightheadedness, vision changes, recurrent headaches, or swelling of extremities. Follow up in 3 months

## 2023-05-10 NOTE — Progress Notes (Signed)
 Subjective:  HPI: [name] is a 58 y.o. female presenting on 05/10/2023 for Medical Management of Chronic Issues   HPI Patient is in today for weight management follow-up. Starting weight 187 lbs. She has completed 4 weeks of 0.5 mg (20 units) weekly semaglutide. She is tolerating this dose well. She continues to work on diet and exercise and is seeing decrease in appetite. She is not counting calories. [name] is walking 30 minutes per day 3 days weekly.  WEIGHT MANAGEMENT Starting BMI: 33.13 Current BMI: 31.71 Starting Waist 45 in Current 44 in Goal weight: 150 lbs Diet: reduced calorie, high protein Exercise: Moderate 30 min 3-4x/wk Mode: walking Medication: Semaglutide 0.5mg  weekly Side Effects: none   Review of Systems  Gastrointestinal: Negative.   All other systems reviewed and are negative.   Relevant past medical history reviewed and updated as indicated.   Past Medical History:  Diagnosis Date   Chronic gastritis    Complication of anesthesia    Depression    Duodenitis 08/2005   Dysphagia    Esophageal dilatation    GERD (gastroesophageal reflux disease)    Hypertension    PONV (postoperative nausea and vomiting)      Past Surgical History:  Procedure Laterality Date   BIOPSY  07/24/2020   Procedure: BIOPSY;  Surgeon: Corbin Ade, MD;  [facility];  Service: Endoscopy;;   BREAST BIOPSY Right 04/26/2013   Procedure: BREAST BIOPSY TIMES TWO;  Surgeon: Dalia Heading, MD;  [facility]: [facility];  Service: General;  Laterality: Right;   BREAST MASS EXCISION     bilateral tumor removal   COLONOSCOPY N/A 01/27/2016   Procedure: COLONOSCOPY;  Surgeon: West Bali, MD;  [facility];  Service: Endoscopy;  Laterality: N/A;  830   DILITATION & CURRETTAGE/HYSTROSCOPY WITH THERMACHOICE ABLATION N/A 10/11/2013   Procedure: DILATATION & CURETTAGE/HYSTEROSCOPY WITH THERMACHOICE ABLATION;  Surgeon: Lazaro Arms, MD;  [facility]: [facility];   Service: Gynecology;  Laterality: N/A;  total therapy time- 20 minutes temp  76 deg C   ESOPHAGEAL DILATION  08/2005   ESOPHAGOGASTRODUODENOSCOPY N/A 01/27/2016   Procedure: ESOPHAGOGASTRODUODENOSCOPY (EGD);  Surgeon: West Bali, MD;  [facility];  Service: Endoscopy;  Laterality: N/A;   ESOPHAGOGASTRODUODENOSCOPY (EGD) WITH PROPOFOL N/A 07/24/2020   Procedure: ESOPHAGOGASTRODUODENOSCOPY (EGD) WITH PROPOFOL;  Surgeon: Corbin Ade, MD;  [facility];  Service: Endoscopy;  Laterality: N/A;  12:30pm   HIATAL HERNIA REPAIR N/A 12/14/2020   Procedure: HERNIA REPAIR HIATAL;  Surgeon: Kinsinger, De Blanch, MD;  [facility];  Service: General;  Laterality: N/A;   LAPAROSCOPIC GASTRIC SLEEVE RESECTION N/A 12/14/2020   Procedure: LAPAROSCOPIC GASTRIC SLEEVE RESECTION;  Surgeon: Sheliah Hatch De Blanch, MD;  [facility];  Service: General;  Laterality: N/A;   MALONEY DILATION N/A 07/24/2020   Procedure: Alvy Beal;  Surgeon: Corbin Ade, MD;  [facility];  Service: Endoscopy;  Laterality: N/A;   POLYPECTOMY  07/24/2020   Procedure: POLYPECTOMY;  Surgeon: Corbin Ade, MD;  [facility];  Service: Endoscopy;;   SAVORY DILATION N/A 01/27/2016   Procedure: SAVORY DILATION;  Surgeon: West Bali, MD;  [facility];  Service: Endoscopy;  Laterality: N/A;   TUBAL LIGATION     UPPER GI ENDOSCOPY N/A 12/14/2020   Procedure: UPPER GI ENDOSCOPY;  Surgeon: Sheliah Hatch, De Blanch, MD;  [facility];  Service: General;  Laterality: N/A;   WISDOM  TOOTH EXTRACTION      Allergies and medications reviewed and updated.   Current Outpatient Medications:    calcium carbonate (TUMS - DOSED IN MG ELEMENTAL CALCIUM) 500 MG chewable tablet, Chew 1 tablet by mouth 3 (three) times daily., Disp: , Rfl:    estradiol (ESTRACE) 1 MG tablet, Take 1 tablet (1 mg total) by mouth daily., Disp: 30 tablet, Rfl: 4   Multiple Vitamins-Minerals  (MULTIVITAMIN ADULT) CHEW, Chew 3 each by mouth daily. Vitafusion gummies 3 daily taken in am, Disp: , Rfl:    progesterone (PROMETRIUM) 200 MG capsule, Take 1 capsule (200 mg total) by mouth at bedtime., Disp: 30 capsule, Rfl: 4  Allergies  Allergen Reactions   Lisinopril Cough    Objective:   BP 118/80   Pulse 78   Temp 98 F (36.7 C)   Ht 5\' 3"  (1.6 m)   Wt 179 lb (81.2 kg)   LMP  (LMP Unknown) Comment: No period in two years  SpO2 99%   BMI 31.71 kg/m      05/10/2023    8:02 AM 04/11/2023    8:04 AM 03/07/2023    7:59 AM  Vitals with BMI  Height 5\' 3"  5\' 3"  5\' 3"   Weight 179 lbs 183 lbs 4 oz 187 lbs  BMI 31.72 32.47 33.13  Systolic 118 124 960  Diastolic 80 82 90  Pulse 78 77 79     Physical Exam Vitals and nursing note reviewed.  Constitutional:      Appearance: Normal appearance. She is obese.  HENT:     Head: Normocephalic and atraumatic.  Skin:    General: Skin is warm and dry.  Neurological:     General: No focal deficit present.     Mental Status: She is alert and oriented to person, place, and time. Mental status is at baseline.  Psychiatric:        Mood and Affect: Mood normal.        Behavior: Behavior normal.        Thought Content: Thought content normal.        Judgment: Judgment normal.     Assessment & Plan:  Class 1 obesity with serious comorbidity and body mass index (BMI) of 33.0 to 33.9 in adult, unspecified obesity type Assessment & Plan: BMI 31.71 from 33.13. Continue heart healthy, low calorie, high protein diet and exercise routine. Goal weight 150lbs. Increase Semaglutide 1mg  weekly. Follow up in 4 weeks.    Essential hypertension, benign Assessment & Plan: Chronic well controlled.  Recommend heart healthy diet such as Mediterranean diet with whole grains, fruits, vegetable, fish, lean meats, nuts, and olive oil. Limit salt. Encouraged moderate walking, 3-5 times/week for 30-50 minutes each session. Aim for at least 150 minutes.week.  Goal should be pace of 3 miles/hours, or walking 1.5 miles in 30 minutes. Avoid tobacco products. Avoid excess alcohol. Take medications as prescribed and bring medications and blood pressure log with cuff to each office visit. Seek medical care for chest pain, palpitations, shortness of breath with exertion, dizziness/lightheadedness, vision changes, recurrent headaches, or swelling of extremities. Follow up in 3 months        Follow up plan: Return in about 3 months (around 08/09/2023) for weight management.  Park Meo, FNP

## 2023-05-10 NOTE — Assessment & Plan Note (Addendum)
 BMI 31.71 from 33.13. Continue heart healthy, low calorie, high protein diet and exercise routine. Goal weight 150lbs. Increase Semaglutide 1mg  weekly. Follow up in 4 weeks.

## 2023-05-15 ENCOUNTER — Other Ambulatory Visit: Payer: Self-pay | Admitting: Adult Health

## 2023-05-15 ENCOUNTER — Other Ambulatory Visit: Payer: Self-pay

## 2023-05-15 ENCOUNTER — Other Ambulatory Visit (HOSPITAL_COMMUNITY): Payer: Self-pay

## 2023-05-15 MED ORDER — PROGESTERONE 200 MG PO CAPS
200.0000 mg | ORAL_CAPSULE | Freq: Every day | ORAL | 0 refills | Status: DC
  Filled 2023-05-15: qty 30, 30d supply, fill #0

## 2023-05-15 MED ORDER — ESTRADIOL 1 MG PO TABS
1.0000 mg | ORAL_TABLET | Freq: Every day | ORAL | 0 refills | Status: DC
Start: 2023-05-15 — End: 2023-06-23
  Filled 2023-05-15: qty 30, 30d supply, fill #0

## 2023-06-19 ENCOUNTER — Other Ambulatory Visit (HOSPITAL_COMMUNITY): Payer: Self-pay

## 2023-06-19 ENCOUNTER — Other Ambulatory Visit: Payer: Self-pay | Admitting: Women's Health

## 2023-06-22 ENCOUNTER — Other Ambulatory Visit: Payer: Self-pay | Admitting: Women's Health

## 2023-06-23 ENCOUNTER — Other Ambulatory Visit: Payer: Self-pay | Admitting: Adult Health

## 2023-06-23 ENCOUNTER — Other Ambulatory Visit (HOSPITAL_COMMUNITY): Payer: Self-pay

## 2023-06-23 ENCOUNTER — Encounter (HOSPITAL_COMMUNITY): Payer: Self-pay

## 2023-06-27 ENCOUNTER — Other Ambulatory Visit (HOSPITAL_COMMUNITY): Payer: Self-pay

## 2023-06-27 ENCOUNTER — Encounter (HOSPITAL_COMMUNITY): Payer: Self-pay

## 2023-06-27 ENCOUNTER — Other Ambulatory Visit: Payer: Self-pay

## 2023-06-27 MED ORDER — ESTRADIOL 1 MG PO TABS
1.0000 mg | ORAL_TABLET | Freq: Every day | ORAL | 5 refills | Status: DC
  Filled 2023-06-27: qty 30, 30d supply, fill #0
  Filled 2023-07-25: qty 30, 30d supply, fill #1
  Filled 2023-08-28: qty 30, 30d supply, fill #2
  Filled 2023-09-22: qty 30, 30d supply, fill #3
  Filled 2023-10-27: qty 30, 30d supply, fill #4
  Filled 2023-11-24: qty 30, 30d supply, fill #5

## 2023-06-27 MED ORDER — PROGESTERONE 200 MG PO CAPS
200.0000 mg | ORAL_CAPSULE | Freq: Every day | ORAL | 5 refills | Status: DC
  Filled 2023-06-27: qty 30, 30d supply, fill #0
  Filled 2023-07-25: qty 30, 30d supply, fill #1
  Filled 2023-08-28: qty 30, 30d supply, fill #2
  Filled 2023-09-22: qty 30, 30d supply, fill #3
  Filled 2023-10-27: qty 30, 30d supply, fill #4
  Filled 2023-11-24: qty 30, 30d supply, fill #5

## 2023-07-19 ENCOUNTER — Encounter (HOSPITAL_COMMUNITY): Payer: Self-pay | Admitting: *Deleted

## 2023-07-25 ENCOUNTER — Other Ambulatory Visit: Payer: Self-pay

## 2023-08-09 ENCOUNTER — Ambulatory Visit: Admitting: Family Medicine

## 2023-08-09 ENCOUNTER — Encounter: Payer: Self-pay | Admitting: Family Medicine

## 2023-08-09 ENCOUNTER — Other Ambulatory Visit (HOSPITAL_COMMUNITY): Payer: Self-pay

## 2023-08-09 ENCOUNTER — Other Ambulatory Visit: Payer: Self-pay

## 2023-08-09 VITALS — BP 122/84 | HR 92 | Temp 98.8°F | Ht 63.0 in | Wt 171.0 lb

## 2023-08-09 DIAGNOSIS — E66811 Obesity, class 1: Secondary | ICD-10-CM

## 2023-08-09 DIAGNOSIS — Z6833 Body mass index (BMI) 33.0-33.9, adult: Secondary | ICD-10-CM | POA: Diagnosis not present

## 2023-08-09 MED ORDER — PHENTERMINE HCL 15 MG PO CAPS
15.0000 mg | ORAL_CAPSULE | ORAL | 2 refills | Status: DC
  Filled 2023-08-09: qty 30, 30d supply, fill #0
  Filled 2023-09-22: qty 30, 30d supply, fill #1
  Filled 2023-10-27: qty 30, 30d supply, fill #2

## 2023-08-09 NOTE — Progress Notes (Signed)
 Subjective:  HPI: [name] is a 58 y.o. female presenting on 08/09/2023 for Medical Management of Chronic Issues   HPI Patient is in today for weight management. Has been taking Semaglutide 1mg  weekly and tolerating well. Has been obtaining from [employer], will now have to switch to [place] direct and cost is not affordable. Would like to switch back to Phentermine , has tried this in the past. No cardiac history. Emphasized the importance of diet and exercise.   WEIGHT MANAGEMENT Starting BMI: 33.13 Current BMI: 30.29 Current waist 40.5in Goal weight: 150 Diet: reduced calorie, high protein Exercise: Moderate 30 min 3-4x/wk Mode: walking occasionally Medication: semaglutide Side Effects: none   Review of Systems  All other systems reviewed and are negative.   Relevant past medical history reviewed and updated as indicated.   Past Medical History:  Diagnosis Date   Chronic gastritis    Complication of anesthesia    Depression    Duodenitis 08/2005   Dysphagia    Esophageal dilatation    GERD (gastroesophageal reflux disease)    Hypertension    PONV (postoperative nausea and vomiting)      Past Surgical History:  Procedure Laterality Date   BIOPSY  07/24/2020   Procedure: BIOPSY;  Surgeon: Shaaron Lamar HERO, MD;  [facility];  Service: Endoscopy;;   BREAST BIOPSY Right 04/26/2013   Procedure: BREAST BIOPSY TIMES TWO;  Surgeon: Oneil DELENA Budge, MD;  [facility];  Service: General;  Laterality: Right;   BREAST MASS EXCISION     bilateral tumor removal   COLONOSCOPY N/A 01/27/2016   Procedure: COLONOSCOPY;  Surgeon: Margo LITTIE Haddock, MD;  [facility];  Service: Endoscopy;  Laterality: N/A;  830   DILITATION & CURRETTAGE/HYSTROSCOPY WITH THERMACHOICE ABLATION N/A 10/11/2013   Procedure: DILATATION & CURETTAGE/HYSTEROSCOPY WITH THERMACHOICE ABLATION;  Surgeon: Vonn VEAR Inch, MD;  [facility];  Service: Gynecology;  Laterality:  N/A;  total therapy time- 20 minutes temp  76 deg C   ESOPHAGEAL DILATION  08/2005   ESOPHAGOGASTRODUODENOSCOPY N/A 01/27/2016   Procedure: ESOPHAGOGASTRODUODENOSCOPY (EGD);  Surgeon: Margo LITTIE Haddock, MD;  [facility];  Service: Endoscopy;  Laterality: N/A;   ESOPHAGOGASTRODUODENOSCOPY (EGD) WITH PROPOFOL  N/A 07/24/2020   Procedure: ESOPHAGOGASTRODUODENOSCOPY (EGD) WITH PROPOFOL ;  Surgeon: Shaaron Lamar HERO, MD;  [facility];  Service: Endoscopy;  Laterality: N/A;  12:30pm   HIATAL HERNIA REPAIR N/A 12/14/2020   Procedure: HERNIA REPAIR HIATAL;  Surgeon: Kinsinger, Herlene Righter, MD;  [facility];  Service: General;  Laterality: N/A;   LAPAROSCOPIC GASTRIC SLEEVE RESECTION N/A 12/14/2020   Procedure: LAPAROSCOPIC GASTRIC SLEEVE RESECTION;  Surgeon: Stevie Herlene Righter, MD;  [facility];  Service: General;  Laterality: N/A;   MALONEY DILATION N/A 07/24/2020   Procedure: AGAPITO HODGKIN;  Surgeon: Shaaron Lamar HERO, MD;  [facility];  Service: Endoscopy;  Laterality: N/A;   POLYPECTOMY  07/24/2020   Procedure: POLYPECTOMY;  Surgeon: Shaaron Lamar HERO, MD;  [facility];  Service: Endoscopy;;   SAVORY DILATION N/A 01/27/2016   Procedure: SAVORY DILATION;  Surgeon: Margo LITTIE Haddock, MD;  [facility];  Service: Endoscopy;  Laterality: N/A;   TUBAL LIGATION     UPPER GI ENDOSCOPY N/A 12/14/2020   Procedure: UPPER GI ENDOSCOPY;  Surgeon: Stevie, Herlene Righter, MD;  [facility];  Service: General;  Laterality: N/A;   WISDOM TOOTH EXTRACTION      Allergies and medications  reviewed and updated.   Current Outpatient Medications:    calcium carbonate (TUMS - DOSED IN MG ELEMENTAL CALCIUM) 500 MG chewable tablet, Chew 1 tablet by mouth 3 (three) times daily., Disp: , Rfl:    estradiol  (ESTRACE ) 1 MG tablet, Take 1 tablet (1 mg total) by mouth daily., Disp: 30 tablet, Rfl: 5   Multiple Vitamins-Minerals (MULTIVITAMIN ADULT) CHEW, Chew 3 each  by mouth daily. Vitafusion gummies 3 daily taken in am, Disp: , Rfl:    phentermine  15 MG capsule, Take 1 capsule (15 mg total) by mouth every morning., Disp: 30 capsule, Rfl: 2   progesterone  (PROMETRIUM ) 200 MG capsule, Take 1 capsule (200 mg total) by mouth at bedtime., Disp: 30 capsule, Rfl: 5  Allergies  Allergen Reactions   Lisinopril Cough    Objective:   BP 122/84   Pulse 92   Temp 98.8 F (37.1 C)   Ht 5' 3 (1.6 m)   Wt 171 lb (77.6 kg)   LMP  (LMP Unknown) Comment: No period in two years  SpO2 98%   BMI 30.29 kg/m      08/09/2023    8:10 AM 05/10/2023    8:02 AM 04/11/2023    8:04 AM  Vitals with BMI  Height 5' 3 5' 3 5' 3  Weight 171 lbs 179 lbs 183 lbs 4 oz  BMI 30.3 31.72 32.47  Systolic 122 118 875  Diastolic 84 80 82  Pulse 92 78 77     Physical Exam Vitals and nursing note reviewed.  Constitutional:      Appearance: Normal appearance. She is obese.  HENT:     Head: Normocephalic and atraumatic.  Cardiovascular:     Rate and Rhythm: Normal rate and regular rhythm.     Pulses: Normal pulses.     Heart sounds: Normal heart sounds.  Pulmonary:     Effort: Pulmonary effort is normal.     Breath sounds: Normal breath sounds.  Skin:    General: Skin is warm and dry.  Neurological:     General: No focal deficit present.     Mental Status: She is alert and oriented to person, place, and time. Mental status is at baseline.  Psychiatric:        Mood and Affect: Mood normal.        Behavior: Behavior normal.        Thought Content: Thought content normal.        Judgment: Judgment normal.     Assessment & Plan:  Class 1 obesity with serious comorbidity and body mass index (BMI) of 33.0 to 33.9 in adult, unspecified obesity type Assessment & Plan: BMI 30.29 from 33.13. Continue heart healthy, low calorie, high protein diet and exercise routine. Goal weight 150lbs. Start Phentermine  15mg  daily. Follow up in 4 weeks.    Other orders -      Phentermine  HCl; Take 1 capsule (15 mg total) by mouth every morning.  Dispense: 30 capsule; Refill: 2     Follow up plan: Return in about 4 weeks (around 09/06/2023) for weight management.  Jeoffrey GORMAN Barrio, FNP

## 2023-08-09 NOTE — Assessment & Plan Note (Signed)
 BMI 30.29 from 33.13. Continue heart healthy, low calorie, high protein diet and exercise routine. Goal weight 150lbs. Start Phentermine  15mg  daily. Follow up in 4 weeks.

## 2023-08-11 ENCOUNTER — Other Ambulatory Visit (HOSPITAL_COMMUNITY): Payer: Self-pay | Admitting: Family Medicine

## 2023-08-11 DIAGNOSIS — Z1231 Encounter for screening mammogram for malignant neoplasm of breast: Secondary | ICD-10-CM

## 2023-08-17 DIAGNOSIS — B078 Other viral warts: Secondary | ICD-10-CM | POA: Diagnosis not present

## 2023-08-18 ENCOUNTER — Ambulatory Visit (HOSPITAL_COMMUNITY)
Admission: RE | Admit: 2023-08-18 | Discharge: 2023-08-18 | Disposition: A | Discharge status: Home / Self Care | Source: Ambulatory Visit

## 2023-08-18 DIAGNOSIS — Z1231 Encounter for screening mammogram for malignant neoplasm of breast: Secondary | ICD-10-CM | POA: Insufficient documentation

## 2023-08-28 ENCOUNTER — Other Ambulatory Visit: Payer: Self-pay

## 2023-09-06 ENCOUNTER — Ambulatory Visit: Admitting: Family Medicine

## 2023-09-22 ENCOUNTER — Other Ambulatory Visit: Payer: Self-pay

## 2023-09-22 ENCOUNTER — Other Ambulatory Visit (HOSPITAL_COMMUNITY): Payer: Self-pay

## 2023-10-27 ENCOUNTER — Other Ambulatory Visit (HOSPITAL_COMMUNITY): Payer: Self-pay

## 2023-10-30 ENCOUNTER — Other Ambulatory Visit: Payer: Self-pay

## 2023-11-24 ENCOUNTER — Other Ambulatory Visit (HOSPITAL_COMMUNITY): Payer: Self-pay

## 2023-12-15 ENCOUNTER — Other Ambulatory Visit (HOSPITAL_COMMUNITY)
Admission: RE | Admit: 2023-12-15 | Discharge: 2023-12-15 | Disposition: A | Discharge status: Home / Self Care | Source: Ambulatory Visit | Attending: Adult Health | Admitting: Adult Health

## 2023-12-15 ENCOUNTER — Encounter: Payer: Self-pay | Admitting: Adult Health

## 2023-12-15 ENCOUNTER — Ambulatory Visit (INDEPENDENT_AMBULATORY_CARE_PROVIDER_SITE_OTHER): Admitting: Adult Health

## 2023-12-15 ENCOUNTER — Other Ambulatory Visit (HOSPITAL_COMMUNITY): Payer: Self-pay

## 2023-12-15 VITALS — BP 125/78 | HR 68 | Ht 63.0 in | Wt 175.0 lb

## 2023-12-15 DIAGNOSIS — Z79890 Hormone replacement therapy (postmenopausal): Secondary | ICD-10-CM

## 2023-12-15 DIAGNOSIS — Z01419 Encounter for gynecological examination (general) (routine) without abnormal findings: Secondary | ICD-10-CM | POA: Insufficient documentation

## 2023-12-15 DIAGNOSIS — R030 Elevated blood-pressure reading, without diagnosis of hypertension: Secondary | ICD-10-CM

## 2023-12-15 DIAGNOSIS — Z780 Asymptomatic menopausal state: Secondary | ICD-10-CM

## 2023-12-15 MED ORDER — PROGESTERONE 200 MG PO CAPS
200.0000 mg | ORAL_CAPSULE | Freq: Every day | ORAL | 3 refills | Status: AC
  Filled 2023-12-15 – 2023-12-25 (×2): qty 90, 90d supply, fill #0

## 2023-12-15 MED ORDER — ESTRADIOL 1 MG PO TABS
1.0000 mg | ORAL_TABLET | Freq: Every day | ORAL | 3 refills | Status: AC
  Filled 2023-12-15 – 2023-12-25 (×2): qty 90, 90d supply, fill #0

## 2023-12-15 NOTE — Progress Notes (Signed)
 Patient ID: [name], female   DOB: [birth year], 58 y.o.   MRN: [id number] History of Present Illness: [name] is a 58 year old white female, married, PM in for a well woman gyn exam and pap. She is on HRT and doing good.  PCP is Renee Barrio NP   Current Medications, Allergies, Past Medical History, Past Surgical History, Family History and Social History were reviewed in [employer] record.     Review of Systems: Patient denies any headaches,  fatigue, blurred vision, shortness of breath, chest pain, abdominal pain, problems with bowel movements, urination, or intercourse. No joint pain or mood swings.  Has noticed some hearing changes and has had eye exam recently with new contacts. Denies any vaginal bleeding Hot flashes are good, no libido    Physical Exam:BP 125/78 (BP Location: Left Arm, Patient Position: Sitting, Cuff Size: Normal)   Pulse 68   Ht 5' 3 (1.6 m)   Wt 175 lb (79.4 kg)   LMP  (LMP Unknown) Comment: No period in two years  BMI 31.00 kg/m   General:  Well developed, well nourished, no acute distress Skin:  Warm and dry Neck:  Midline trachea, normal thyroid , good ROM, no lymphadenopathy Lungs; Clear to auscultation bilaterally Breast:  No dominant palpable mass, retraction, or nipple discharge Cardiovascular: Regular rate and rhythm Abdomen:  Soft, non tender, no hepatosplenomegaly Pelvic:  External genitalia is normal in appearance, no lesions.  The vagina is normal in appearance. Urethra has no lesions or masses. The cervix is sn=mooth, pap with HR HPV genotyping performed.  Uterus is felt to be normal size, shape, and contour.  No adnexal masses or tenderness noted.Bladder is non tender, no masses felt. Rectal: Deferred  Extremities/musculoskeletal:  No swelling or varicosities noted, no clubbing or cyanosis Psych:  No mood changes, alert and cooperative,seems happy AA is 0 Fall risk is low    12/15/2023    8:39 AM 08/09/2023    8:14  AM 05/10/2023    8:13 AM  Depression screen PHQ 2/9  Decreased Interest 0 0 0  Down, Depressed, Hopeless 0 0 0  PHQ - 2 Score 0 0 0  Altered sleeping 0 0   Tired, decreased energy 1 0   Change in appetite 1 0   Feeling bad or failure about yourself  0 0   Trouble concentrating 0 0   Moving slowly or fidgety/restless 0 0   Suicidal thoughts 0 0   PHQ-9 Score 2 0    Difficult doing work/chores  Not difficult at all      Data saved with a previous flowsheet row definition       12/15/2023    8:39 AM 08/09/2023    8:14 AM 04/11/2023    8:07 AM 03/07/2023    8:07 AM  GAD 7 : Generalized Anxiety Score  Nervous, Anxious, on Edge 0 0 0 1  Control/stop worrying 0 0 1 0  Worry too much - different things 0 0 1 0  Trouble relaxing 0 0 0 1  Restless 0 0 0 0  Easily annoyed or irritable 0 0 1 1  Afraid - awful might happen 0 0 0 0  Total GAD 7 Score 0 0 3 3  Anxiety Difficulty  Not difficult at all Not difficult at all Not difficult at all      Upstream - 12/15/23 0835       Pregnancy Intention Screening   Does the patient want to  become pregnant in the next year? N/A    Does the patient's partner want to become pregnant in the next year? N/A    Would the patient like to discuss contraceptive options today? N/A      Contraception Wrap Up   Current Method Female Sterilization;Post-Menopause    End Method Female Sterilization;Post-Menopause    Contraception Counseling Provided No         Examination chaperoned by Clarita Salt LPN   Impression and plan: 1. Encounter for gynecological examination with Papanicolaou smear of cervix (Primary) Pap sent Pap in 3 years if negative Physical in 1 year Labs with PCP Mammogram was negative 08/18/23 Colonoscopy per GI Sty active  - Cytology - PAP( [facility])  2. Hormone replacement therapy (HRT) Happy with HRT, will refill estrace  1 mg 1 daily and Prometrium  200 mg 1 at hs  Meds ordered this encounter  Medications   estradiol   (ESTRACE ) 1 MG tablet    Sig: Take 1 tablet (1 mg total) by mouth daily.    Dispense:  90 tablet    Refill:  3    Supervising Provider:   JAYNE MINDER H [[id number]]   progesterone  (PROMETRIUM ) 200 MG capsule    Sig: Take 1 capsule (200 mg total) by mouth at bedtime.    Dispense:  90 capsule    Refill:  3    Supervising Provider:   JAYNE MINDER H [[id number]]     3. Postmenopause Denies any vaginal bleeding  4. Elevated BP without diagnosis of hypertension Good on recheck

## 2023-12-21 LAB — CYTOLOGY - PAP
Adequacy: ABSENT
Comment: NEGATIVE
Comment: NEGATIVE
Comment: NEGATIVE
HPV 16: NEGATIVE
HPV 18 / 45: POSITIVE — AB
High risk HPV: POSITIVE — AB

## 2023-12-22 ENCOUNTER — Ambulatory Visit: Payer: Self-pay | Admitting: Adult Health

## 2023-12-22 DIAGNOSIS — R87612 Low grade squamous intraepithelial lesion on cytologic smear of cervix (LGSIL): Secondary | ICD-10-CM | POA: Insufficient documentation

## 2023-12-22 DIAGNOSIS — R87619 Unspecified abnormal cytological findings in specimens from cervix uteri: Secondary | ICD-10-CM | POA: Insufficient documentation

## 2023-12-25 ENCOUNTER — Other Ambulatory Visit: Payer: Self-pay

## 2023-12-25 ENCOUNTER — Other Ambulatory Visit (HOSPITAL_COMMUNITY): Payer: Self-pay

## 2024-01-11 ENCOUNTER — Encounter: Admitting: Women's Health

## 2024-02-06 ENCOUNTER — Other Ambulatory Visit (HOSPITAL_COMMUNITY)
Admission: RE | Admit: 2024-02-06 | Discharge: 2024-02-06 | Disposition: A | Source: Ambulatory Visit | Attending: Women's Health | Admitting: Women's Health

## 2024-02-06 ENCOUNTER — Ambulatory Visit: Admitting: Women's Health

## 2024-02-06 ENCOUNTER — Encounter: Payer: Self-pay | Admitting: Women's Health

## 2024-02-06 VITALS — Ht 63.0 in | Wt 176.1 lb

## 2024-02-06 DIAGNOSIS — N871 Moderate cervical dysplasia: Secondary | ICD-10-CM | POA: Diagnosis not present

## 2024-02-06 DIAGNOSIS — N841 Polyp of cervix uteri: Secondary | ICD-10-CM | POA: Insufficient documentation

## 2024-02-06 DIAGNOSIS — R87612 Low grade squamous intraepithelial lesion on cytologic smear of cervix (LGSIL): Secondary | ICD-10-CM

## 2024-02-06 NOTE — Addendum Note (Signed)
 Addended by: SANNA GONG A on: 02/06/2024 03:38 PM   Modules accepted: Orders

## 2024-02-06 NOTE — Progress Notes (Signed)
" ° °  COLPOSCOPY PROCEDURE NOTE Patient name: [name] MRN [id number]  Date of birth: February 12, 1965 Subjective Findings:   [name] is a 59 y.o. [id number] Caucasian female being seen today for a colposcopy. Indication: Abnormal pap on 12/15/23: LSIL w/ HRHPV positive: 18/45  Prior cytology:  Date Result Procedure  2022 NILM w/ HRHPV negative None  2015 NILM w/ HRHPV not done None  2014 NILM w/ HRHPV negative None  2013 NILM w/ HRHPV not done None  No LMP recorded (lmp unknown). Patient is postmenopausal. Contraception: abstinence and post menopausal status. Menopausal: yes. Hysterectomy: no.   Considering pregnancy: No New sex partner: no Smoker: no. Immunocompromised: no.   The risks and benefits were explained and informed consent was obtained, and written copy is in chart. Pertinent History Reviewed:   Reviewed past medical,surgical, social, obstetrical and family history.  Reviewed problem list, medications and allergies. Objective Findings & Procedure:   Vitals:   02/06/24 1429  Weight: 176 lb 1.6 oz (79.9 kg)  Height: 5' 3 (1.6 m)  Body mass index is 31.19 kg/m.  No results found for this or any previous visit (from the past 24 hours).   Time out was performed.  Speculum placed in the vagina, cervix fully visualized, polyp protruding from os, removed w/ ring forceps. SCJ: not fully visualized. Cervix swabbed x 3 with acetic acid.  Acetowhitening present: Yes Cervix: no visible lesions, no mosaicism, no punctation, no abnormal vasculature, and acetowhite lesion(s) noted at 8 (circular area w/ red center- when bx taken fluid leaked out, ? [place]) & 4 o'clock. Endocervical curettage performed, Cervical biopsies taken at 8 & 4 o'clock, and Hemostasis achieved with Monsel's solution. Vagina: vaginal colposcopy not performed Vulva: vulvar colposcopy not performed  Specimens: 4  Complications: none  Chaperone: [name]  Colposcopic Impression & Plan:    Colposcopy findings consistent with LSIL Plan: Post biopsy instructions given, Will notify patient of results when back, and Will base plan of care on pathology results and ASCCP guidelines  Cervical polyp-removed and sent to path  Return in about 1 year (around 02/05/2025) for Pap & physical.  [name] CNM, [facility] 02/06/2024 3:06 PM  "

## 2024-02-06 NOTE — Patient Instructions (Signed)
 Colposcopy, Care After  The following information offers guidance on how to care for yourself after your procedure. Your health care provider may also give you more specific instructions. If you have problems or questions, contact your health care provider. What can I expect after the procedure? If you had a colposcopy without a biopsy, you can expect to feel fine right away after your procedure. However, you may have some spotting of blood for a few days. You can return to your normal activities. If you had a colposcopy with a biopsy, it is common after the procedure to have: Soreness and mild pain. These may last for a few days. Mild vaginal bleeding or discharge that is dark-colored and grainy. This may last for a few days. The discharge may be caused by a liquid (solution) that was used during the procedure. You may need to wear a sanitary pad during this time. Spotting of blood for at least 48 hours after the procedure. Follow these instructions at home: Medicines Take over-the-counter and prescription medicines only as told by your health care provider. Talk with your health care provider about what type of over-the-counter pain medicines and prescription medicines you can start to take again. It is especially important to talk with your health care provider if you take blood thinners. Activity Avoid using douche products, using tampons, and having sex for at least 3 days after the procedure or for as long as told by your health care provider. Return to your normal activities as told by your health care provider. Ask your health care provider what activities are safe for you. General instructions Ask your health care provider if you may take baths, swim, or use a hot tub. You may take showers. If you use birth control (contraception), continue to use it. Keep all follow-up visits. This is important. Contact a health care provider if: You have a fever or chills. You faint or feel  light-headed. Get help right away if: You have heavy bleeding from your vagina or pass blood clots. Heavy bleeding is bleeding that soaks through a sanitary pad in less than 1 hour. You have vaginal discharge that is abnormal, is yellow in color, or smells bad. This could be a sign of infection. You have severe pain or cramps in your lower abdomen that do not go away with medicine. Summary If you had a colposcopy without a biopsy, you can expect to feel fine right away, but you may have some spotting of blood for a few days. You can return to your normal activities. If you had a colposcopy with a biopsy, it is common to have mild pain for a few days and spotting for 48 hours after the procedure. Avoid using douche products, using tampons, and having sex for at least 3 days after the procedure or for as long as told by your health care provider. Get help right away if you have heavy bleeding, severe pain, or signs of infection. This information is not intended to replace advice given to you by your health care provider. Make sure you discuss any questions you have with your health care provider. Document Revised: 06/14/2020 Document Reviewed: 06/14/2020 Elsevier Patient Education  2024 [employer].

## 2024-02-13 LAB — SURGICAL PATHOLOGY

## 2024-02-14 ENCOUNTER — Telehealth: Payer: Self-pay

## 2024-02-14 ENCOUNTER — Ambulatory Visit: Payer: Self-pay | Admitting: Women's Health

## 2024-02-14 NOTE — Telephone Encounter (Signed)
 Called patient and left voicemail about getting appointment to discuss test results.

## 2024-02-15 ENCOUNTER — Ambulatory Visit: Admitting: Obstetrics & Gynecology

## 2024-02-15 ENCOUNTER — Encounter: Payer: Self-pay | Admitting: Obstetrics & Gynecology

## 2024-02-15 VITALS — BP 152/86 | HR 86 | Ht 63.0 in | Wt 176.0 lb

## 2024-02-15 DIAGNOSIS — D069 Carcinoma in situ of cervix, unspecified: Secondary | ICD-10-CM

## 2024-02-15 DIAGNOSIS — Z029 Encounter for administrative examinations, unspecified: Secondary | ICD-10-CM

## 2024-02-15 NOTE — Progress Notes (Signed)
 " Follow up appointment for results: Cervical biopsy  Chief Complaint  Patient presents with   Follow-up    Blood pressure (!) 152/86, pulse 86, height 5' 3 (1.6 m), weight 176 lb (79.8 kg).    Cervix: High grade cervical dysplasia, large lesion encompassing the SCJ in total based on location and pathology associated Benign endocervical polyp  MEDS ordered this encounter: No orders of the defined types were placed in this encounter.   Orders for this encounter: Orders Placed This Encounter  Procedures   Ambulatory Referral For Surgery Scheduling    Impression + Management Plan   ICD-10-CM   1. High grade squamous intraepithelial lesion (HGSIL), grade 3 CIN, on biopsy of cervix, large lesion, encompasses SCJ in total  D06.9 Ambulatory Referral For Surgery Scheduling     We discussed cervical laser ablation, conization and definitive therapy with RA TLH + BSO given large size and widespread presence of cervical dysplasia in a post menopausal woman All questions answered regarding the various surgeries She will consider her options and communicate via My Chart  Possible week of 03/27/24  Follow Up: Return if symptoms worsen or fail to improve.     All questions were answered.  Past Medical History:  Diagnosis Date   Chronic gastritis    Complication of anesthesia    Depression    Duodenitis 08/2005   Dysphagia    Esophageal dilatation    GERD (gastroesophageal reflux disease)    Hypertension    PONV (postoperative nausea and vomiting)     Past Surgical History:  Procedure Laterality Date   BIOPSY  07/24/2020   Procedure: BIOPSY;  Surgeon: Shaaron Lamar HERO, MD;  Location: [facility];  Service: Endoscopy;;   BREAST BIOPSY Right 04/26/2013   Procedure: BREAST BIOPSY TIMES TWO;  Surgeon: Oneil DELENA Budge, MD;  Location: [facility];  Service: General;  Laterality: Right;   BREAST MASS EXCISION     bilateral tumor removal   COLONOSCOPY N/A 01/27/2016   Procedure:  COLONOSCOPY;  Surgeon: Margo LITTIE Haddock, MD;  Location: [facility];  Service: Endoscopy;  Laterality: N/A;  830   DILITATION & CURRETTAGE/HYSTROSCOPY WITH THERMACHOICE ABLATION N/A 10/11/2013   Procedure: DILATATION & CURETTAGE/HYSTEROSCOPY WITH THERMACHOICE ABLATION;  Surgeon: Vonn VEAR Inch, MD;  Location: [facility];  Service: Gynecology;  Laterality: N/A;  total therapy time- 20 minutes temp  76 deg C   ESOPHAGEAL DILATION  08/2005   ESOPHAGOGASTRODUODENOSCOPY N/A 01/27/2016   Procedure: ESOPHAGOGASTRODUODENOSCOPY (EGD);  Surgeon: Margo LITTIE Haddock, MD;  [facility]: [facility];  Service: Endoscopy;  Laterality: N/A;   ESOPHAGOGASTRODUODENOSCOPY (EGD) WITH PROPOFOL  N/A 07/24/2020   Procedure: ESOPHAGOGASTRODUODENOSCOPY (EGD) WITH PROPOFOL ;  Surgeon: Shaaron Lamar HERO, MD;  Location: [facility];  Service: Endoscopy;  Laterality: N/A;  12:30pm   HIATAL HERNIA REPAIR N/A 12/14/2020   Procedure: HERNIA REPAIR HIATAL;  Surgeon: Kinsinger, Herlene Righter, MD;  Location: [facility];  Service: General;  Laterality: N/A;   LAPAROSCOPIC GASTRIC SLEEVE RESECTION N/A 12/14/2020   Procedure: LAPAROSCOPIC GASTRIC SLEEVE RESECTION;  Surgeon: Stevie Herlene Righter, MD;  Location: [facility];  Service: General;  Laterality: N/A;   MALONEY DILATION N/A 07/24/2020   Procedure: AGAPITO HODGKIN;  Surgeon: Shaaron Lamar HERO, MD;  Location: [facility];  Service: Endoscopy;  Laterality: N/A;   POLYPECTOMY  07/24/2020   Procedure: POLYPECTOMY;  Surgeon: Shaaron Lamar HERO, MD;  [facility]: [facility];  Service: Endoscopy;;   SAVORY DILATION N/A 01/27/2016   Procedure: SAVORY DILATION;  Surgeon: Margo LITTIE Haddock, MD;  Location: [facility];  Service: Endoscopy;  Laterality: N/A;   TUBAL LIGATION     UPPER GI ENDOSCOPY N/A 12/14/2020   Procedure: UPPER GI ENDOSCOPY;  Surgeon: Kinsinger, Herlene Righter, MD;  Location: [facility];  Service: General;  Laterality: N/A;   WISDOM TOOTH EXTRACTION      OB History     Gravida  2   Para  2   Term   2   Preterm      AB      Living  2      SAB      IAB      Ectopic      Multiple      Live Births  2           Allergies[1]  Social History   Socioeconomic History   Marital status: Married    Spouse name: Not on file   Number of children: 2   Years of education: Not on file   Highest education level: Not on file  Occupational History   Occupation: Full time    Employer: [employer]  Tobacco Use   Smoking status: Never   Smokeless tobacco: Never  Vaping Use   Vaping status: Never Used  Substance and Sexual Activity   Alcohol use: No   Drug use: No   Sexual activity: Not Currently    Birth control/protection: Surgical, Post-menopausal    Comment: tubal & ablation, menopause  Other Topics Concern   Not on file  Social History Narrative   No regular exercise   Social Drivers of Health   Tobacco Use: Low Risk (02/15/2024)   Patient History    Smoking Tobacco Use: Never    Smokeless Tobacco Use: Never    Passive Exposure: Not on file  Financial Resource Strain: Low Risk (12/15/2023)   Overall Financial Resource Strain (CARDIA)    Difficulty of Paying Living Expenses: Not very hard  Food Insecurity: No Food Insecurity (12/15/2023)   Epic    Worried About Programme Researcher, Broadcasting/film/video in the Last Year: Never true    Ran Out of Food in the Last Year: Never true  Transportation Needs: No Transportation Needs (12/15/2023)   Epic    Lack of Transportation (Medical): No    Lack of Transportation (Non-Medical): No  Physical Activity: Insufficiently Active (12/15/2023)   Exercise Vital Sign    Days of Exercise per Week: 2 days    Minutes of Exercise per Session: 30 min  Stress: No Stress Concern Present (12/15/2023)   [employer] of Occupational [employer] - Occupational Stress Questionnaire    Feeling of Stress: Not at all  Social Connections: Socially Integrated (12/15/2023)   Social Connection and Isolation Panel    Frequency of Communication with  Friends and Family: Twice a week    Frequency of Social Gatherings with Friends and Family: Twice a week    Attends Religious Services: More than 4 times per year    Active Member of Clubs or Organizations: Yes    Attends Banker Meetings: More than 4 times per year    Marital Status: Married  Depression (PHQ2-9): Low Risk (12/15/2023)   Depression (PHQ2-9)    PHQ-2 Score: 2  Alcohol Screen: Low Risk (12/15/2023)   Alcohol Screen    Last Alcohol Screening Score (AUDIT): 0  Housing: Low Risk (12/15/2023)   Epic    Unable to Pay for Housing in the Last Year: No  Number of Times Moved in the Last Year: 0    Homeless in the Last Year: No  Utilities: Not At Risk (12/15/2023)   Epic    Threatened with loss of utilities: No  Health Literacy: Adequate Health Literacy (12/15/2023)   B1300 Health Literacy    Frequency of need for help with medical instructions: Never    Family History  Problem Relation Age of Onset   Hypertension Father    Stroke Father    Heart disease Father    Heart disease Mother    Cancer Paternal Aunt        colon   Cancer Paternal Uncle        colon   Cancer Cousin        colon       [1]  Allergies Allergen Reactions   Lisinopril Cough   "

## 2024-02-16 ENCOUNTER — Encounter: Payer: Self-pay | Admitting: Obstetrics & Gynecology

## 2024-02-16 NOTE — Addendum Note (Signed)
 Addended by: JAYNE VONN DEL on: 02/16/2024 10:55 AM   Modules accepted: Orders

## 2024-02-20 ENCOUNTER — Encounter: Payer: Self-pay | Admitting: Women's Health

## 2024-02-21 ENCOUNTER — Encounter: Payer: Self-pay | Admitting: Obstetrics & Gynecology

## 2024-02-22 ENCOUNTER — Ambulatory Visit: Admitting: Obstetrics & Gynecology

## 2024-03-01 ENCOUNTER — Other Ambulatory Visit: Payer: Commercial Managed Care - PPO

## 2024-03-07 ENCOUNTER — Encounter: Payer: Commercial Managed Care - PPO | Admitting: Family Medicine

## 2024-03-22 ENCOUNTER — Encounter (HOSPITAL_COMMUNITY)

## 2024-03-27 ENCOUNTER — Ambulatory Visit (HOSPITAL_COMMUNITY): Admit: 2024-03-27 | Admitting: Obstetrics & Gynecology

## 2024-03-27 SURGERY — HYSTERECTOMY, TOTAL, BILATERAL SAPLINGO-OOPHORECTOMY, ROBOT ASSISTED, LAPAROSCOPIC, D5
Anesthesia: General | Laterality: Bilateral

## 2024-04-02 ENCOUNTER — Encounter: Admitting: Obstetrics & Gynecology
